# Patient Record
Sex: Female | Born: 1970
Health system: Southern US, Community
[De-identification: ages and names within clinical notes are randomized; demographics above are authoritative.]

## PROBLEM LIST (undated history)

## (undated) DIAGNOSIS — J069 Acute upper respiratory infection, unspecified: Secondary | ICD-10-CM

## (undated) DIAGNOSIS — Z8669 Personal history of other diseases of the nervous system and sense organs: Secondary | ICD-10-CM

## (undated) DIAGNOSIS — F419 Anxiety disorder, unspecified: Secondary | ICD-10-CM

## (undated) DIAGNOSIS — K219 Gastro-esophageal reflux disease without esophagitis: Principal | ICD-10-CM

## (undated) DIAGNOSIS — S61209A Unspecified open wound of unspecified finger without damage to nail, initial encounter: Secondary | ICD-10-CM

## (undated) DIAGNOSIS — J019 Acute sinusitis, unspecified: Secondary | ICD-10-CM

## (undated) DIAGNOSIS — T7840XA Allergy, unspecified, initial encounter: Secondary | ICD-10-CM

## (undated) DIAGNOSIS — L03119 Cellulitis of unspecified part of limb: Secondary | ICD-10-CM

## (undated) DIAGNOSIS — R0789 Other chest pain: Secondary | ICD-10-CM

## (undated) DIAGNOSIS — R030 Elevated blood-pressure reading, without diagnosis of hypertension: Secondary | ICD-10-CM

## (undated) DIAGNOSIS — R040 Epistaxis: Secondary | ICD-10-CM

## (undated) DIAGNOSIS — R739 Hyperglycemia, unspecified: Secondary | ICD-10-CM

## (undated) DIAGNOSIS — F329 Major depressive disorder, single episode, unspecified: Secondary | ICD-10-CM

## (undated) DIAGNOSIS — M533 Sacrococcygeal disorders, not elsewhere classified: Secondary | ICD-10-CM

## (undated) DIAGNOSIS — G4700 Insomnia, unspecified: Secondary | ICD-10-CM

## (undated) DIAGNOSIS — Z720 Tobacco use: Secondary | ICD-10-CM

## (undated) DIAGNOSIS — Z0000 Encounter for general adult medical examination without abnormal findings: Secondary | ICD-10-CM

## (undated) DIAGNOSIS — E663 Overweight: Secondary | ICD-10-CM

## (undated) DIAGNOSIS — Z862 Personal history of diseases of the blood and blood-forming organs and certain disorders involving the immune mechanism: Secondary | ICD-10-CM

## (undated) DIAGNOSIS — D649 Anemia, unspecified: Secondary | ICD-10-CM

## (undated) DIAGNOSIS — M549 Dorsalgia, unspecified: Secondary | ICD-10-CM

## (undated) DIAGNOSIS — F17201 Nicotine dependence, unspecified, in remission: Secondary | ICD-10-CM

## (undated) DIAGNOSIS — Z9189 Other specified personal risk factors, not elsewhere classified: Secondary | ICD-10-CM

## (undated) HISTORY — DX: Unspecified open wound of unspecified finger without damage to nail, initial encounter: S61.209A

## (undated) HISTORY — DX: Major depressive disorder, single episode, unspecified: F32.9

## (undated) HISTORY — DX: Anxiety disorder, unspecified: F41.9

## (undated) HISTORY — DX: Epistaxis: R04.0

## (undated) HISTORY — DX: Cellulitis of unspecified part of limb: L03.119

## (undated) HISTORY — DX: Personal history of diseases of the blood and blood-forming organs and certain disorders involving the immune mechanism: Z86.2

## (undated) HISTORY — DX: Allergy, unspecified, initial encounter: T78.40XA

## (undated) HISTORY — DX: Hyperglycemia, unspecified: R73.9

## (undated) HISTORY — PX: COLONOSCOPY: SHX174

## (undated) HISTORY — DX: Overweight: E66.3

## (undated) HISTORY — DX: Insomnia, unspecified: G47.00

## (undated) HISTORY — DX: Other chest pain: R07.89

## (undated) HISTORY — DX: Acute sinusitis, unspecified: J01.90

## (undated) HISTORY — DX: Anemia, unspecified: D64.9

## (undated) HISTORY — PX: WISDOM TOOTH EXTRACTION: SHX21

## (undated) HISTORY — DX: Other specified personal risk factors, not elsewhere classified: Z91.89

## (undated) HISTORY — DX: Elevated blood-pressure reading, without diagnosis of hypertension: R03.0

## (undated) HISTORY — DX: Encounter for general adult medical examination without abnormal findings: Z00.00

## (undated) HISTORY — DX: Gastro-esophageal reflux disease without esophagitis: K21.9

## (undated) HISTORY — PX: OTHER SURGICAL HISTORY: SHX169

## (undated) HISTORY — DX: Nicotine dependence, unspecified, in remission: F17.201

## (undated) HISTORY — DX: Dorsalgia, unspecified: M54.9

## (undated) HISTORY — DX: Sacrococcygeal disorders, not elsewhere classified: M53.3

## (undated) HISTORY — DX: Personal history of other diseases of the nervous system and sense organs: Z86.69

## (undated) HISTORY — DX: Acute upper respiratory infection, unspecified: J06.9

## (undated) HISTORY — DX: Tobacco use: Z72.0

---

## 2006-12-26 ENCOUNTER — Emergency Department (HOSPITAL_COMMUNITY): Admission: EM | Admit: 2006-12-26 | Discharge: 2006-12-26 | Payer: Self-pay | Admitting: Family Medicine

## 2007-02-16 ENCOUNTER — Emergency Department (HOSPITAL_COMMUNITY): Admission: EM | Admit: 2007-02-16 | Discharge: 2007-02-16 | Payer: Self-pay | Admitting: Family Medicine

## 2008-12-06 ENCOUNTER — Encounter: Payer: Self-pay | Admitting: Family Medicine

## 2009-05-12 ENCOUNTER — Emergency Department (HOSPITAL_COMMUNITY): Admission: EM | Admit: 2009-05-12 | Discharge: 2009-05-12 | Payer: Self-pay | Admitting: Family Medicine

## 2010-12-31 ENCOUNTER — Encounter: Payer: Self-pay | Admitting: Family Medicine

## 2011-01-20 ENCOUNTER — Telehealth (INDEPENDENT_AMBULATORY_CARE_PROVIDER_SITE_OTHER): Payer: Self-pay | Admitting: *Deleted

## 2011-01-20 ENCOUNTER — Encounter: Payer: Self-pay | Admitting: Family Medicine

## 2011-01-20 ENCOUNTER — Ambulatory Visit
Admission: RE | Admit: 2011-01-20 | Discharge: 2011-01-20 | Payer: Self-pay | Source: Home / Self Care | Attending: Family Medicine | Admitting: Family Medicine

## 2011-01-20 DIAGNOSIS — F418 Other specified anxiety disorders: Secondary | ICD-10-CM | POA: Insufficient documentation

## 2011-01-20 DIAGNOSIS — Z8669 Personal history of other diseases of the nervous system and sense organs: Secondary | ICD-10-CM

## 2011-01-20 DIAGNOSIS — K219 Gastro-esophageal reflux disease without esophagitis: Secondary | ICD-10-CM

## 2011-01-20 DIAGNOSIS — Z9189 Other specified personal risk factors, not elsewhere classified: Secondary | ICD-10-CM | POA: Insufficient documentation

## 2011-01-20 DIAGNOSIS — Z862 Personal history of diseases of the blood and blood-forming organs and certain disorders involving the immune mechanism: Secondary | ICD-10-CM

## 2011-01-20 DIAGNOSIS — K5289 Other specified noninfective gastroenteritis and colitis: Secondary | ICD-10-CM | POA: Insufficient documentation

## 2011-01-20 DIAGNOSIS — F329 Major depressive disorder, single episode, unspecified: Secondary | ICD-10-CM

## 2011-01-20 HISTORY — DX: Personal history of other diseases of the nervous system and sense organs: Z86.69

## 2011-01-20 HISTORY — DX: Personal history of diseases of the blood and blood-forming organs and certain disorders involving the immune mechanism: Z86.2

## 2011-01-20 HISTORY — DX: Other specified personal risk factors, not elsewhere classified: Z91.89

## 2011-01-20 HISTORY — DX: Major depressive disorder, single episode, unspecified: F32.9

## 2011-01-20 HISTORY — DX: Gastro-esophageal reflux disease without esophagitis: K21.9

## 2011-01-22 ENCOUNTER — Telehealth: Payer: Self-pay | Admitting: Family Medicine

## 2011-01-27 ENCOUNTER — Ambulatory Visit
Admission: RE | Admit: 2011-01-27 | Discharge: 2011-01-27 | Payer: Self-pay | Source: Home / Self Care | Attending: Family Medicine | Admitting: Family Medicine

## 2011-01-27 DIAGNOSIS — S61209A Unspecified open wound of unspecified finger without damage to nail, initial encounter: Secondary | ICD-10-CM

## 2011-01-27 HISTORY — DX: Unspecified open wound of unspecified finger without damage to nail, initial encounter: S61.209A

## 2011-01-29 NOTE — Progress Notes (Signed)
Summary: Medical Records Received  Phone Note Other Incoming   Summary of Call: Medical Records received from [place]. [facility] Dr. Edd Arbour 1.26.2012. Initial call taken by: Georga Bora,  January 22, 2011 2:00 PM

## 2011-01-29 NOTE — Letter (Signed)
Summary: Out of Work  [facility]  [address]   [place], [place] [address]   Phone: [phone]  Fax: [phone]    01/20/2011  TO: Leodis Sias IT MAY CONCERN  RE: Samantha Morales [phone] [id number]       The above named individual is currently under my care and will be out of work    FROM: 01/19/2011   THROUGH:01/20/2011, return to work on 1/25    REASON: Gastroenteritis    MAY RETURN ON: 01/21/11     If you have any further questions or need additional information, please call.     Sincerely,   Danise Edge MD typed by: Danise Edge MD

## 2011-01-29 NOTE — Progress Notes (Signed)
Summary: Medication and allergies      New Allergies: ! SULFA ! PENICILLIN ! ERYTHROMYCIN New/Updated Medications: CELEXA 20 MG TABS (CITALOPRAM HYDROBROMIDE) once daily OMEPRAZOLE 20 MG CPDR (OMEPRAZOLE) once daily VITAMIN D (ERGOCALCIFEROL) 50000 UNIT CAPS (ERGOCALCIFEROL) once weekly INTEGRA PLUS  CAPS (FEFUM-FEPOLY-FA-B CMP-C-BIOT) once daily IBUPROFEN 200 MG CAPS (IBUPROFEN) 4 q 4 hours New Allergies: ! SULFA ! PENICILLIN ! ERYTHROMYCIN    Current Medications (verified): 1)  Celexa 20 Mg Tabs (Citalopram Hydrobromide) .... Once Daily 2)  Omeprazole 20 Mg Cpdr (Omeprazole) .... Once Daily 3)  Vitamin D (Ergocalciferol) 50000 Unit Caps (Ergocalciferol) .... Once Weekly 4)  Integra Plus  Caps (Fefum-Fepoly-Fa-B Cmp-C-Biot) .... Once Daily 5)  Ibuprofen 200 Mg Caps (Ibuprofen) .... 4 Q 4 Hours  Allergies (verified): 1)  ! Sulfa 2)  ! Penicillin 3)  ! Erythromycin

## 2011-01-29 NOTE — Assessment & Plan Note (Addendum)
Summary: New pt est care/dt moved appt per pt/dt   Vital Signs:  Patient profile:   40 year old female Height:      67.5 inches (171.45 cm) Weight:      152.50 pounds (69.32 kg) BMI:     23.62 O2 Sat:      100 % on Room air Temp:     98.3 degrees F (36.83 degrees C) oral Pulse rate:   80 / minute BP sitting:   116 / 75  (right arm) Cuff size:   regular  Vitals Entered By: Josph Macho RMA (January 20, 2011 12:58 PM)  O2 Flow:  Room air  History of Present Illness: Patient is a 40 yo caucasian femalein today for urgent new pateint appt. issue has been struggling with gastroenteritis for 2-3 days now. Symptoms started at 6 AM on Monday with diarrhea. She reports watery stool every 10 minutes or less over a full 24. She ultimately took 2 Imodium and then repeated twice yesterday her diarrhea finally stopped late last night. This morning she's had just one loose stool. She had 3 episodes of vomiting yesterday and now just one today. She noticed she was able to have a piece of toast this morning and so has not had any subsequent diarrhea or nausea as a result she denies ever seeing any bloody stool or vomitus. She denies any fevers chills or urinary symptoms. She continues to urinate has not had decreased urinary frequency. She's had very little in the way of fluids but has been trying to drink a lot of water and Gatorade. She's had some abdominal cramping but no severe abdominal pains. No chest pain, palpitations, shortness of breath, fevers chills, headache prior to this acute episode she reports she had been in good health he will she doesn't not taking ibuprofen or some troubles in her legs. 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 previously being diagnosed with restless leg syndrome to this area for leg symptoms at night and was tried on a course of Requip but this  caused vomiting so she has not restarted  Preventive Screening-Counseling & Management  Alcohol-Tobacco     Smoking Status: current  Caffeine-Diet-Exercise     Does Patient Exercise: no      Drug Use:  no.    Current Problems (verified): 1)  Restless Leg Syndrome, Hx of  (ICD-V12.49) 2)  Gastroenteritis  (ICD-558.9) 3)  Anemia, Hx of  (ICD-V12.3) 4)  Chickenpox, Hx of  (ICD-V15.9) 5)  Gerd  (ICD-530.81) 6)  Depression  (ICD-311)  Allergies: 1)  ! Sulfa 2)  ! Penicillin 3)  ! 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, 2, in college, soccer scholarship  Current Smoker 1/2 ppd Alcohol use-yes, occasional Drug use-no Regular exercise-no Wears seat belt No dietary restrictions  Risk Factors: Exercise: no (01/20/2011)  Risk Factors: Smoking Status: current (01/20/2011)  Past Surgical History: Lasik eye surgery x b/l  Family History: Father: 55, HTN Mother: 4, A&W Siblings:  Brother:42, HTN Brother:45, anemia Sister: 60, Lupus MGM: deceased in mid 60s, breast cancer, Alzheimers MGF: deceased@80 , brain aneurysm, rheumatoid arthritis PGM: deceased in 63s, old age, kidney stones PGF: deceased in late 33s, MI Children:  None  Social History: Occupation: Psychologist, clinical Married, 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, 2, in college, soccer scholarship  Current Smoker 1/2 ppd Alcohol use-yes, occasional Drug use-no Regular exercise-no Wears seat belt No dietary restrictionsOccupation:  employed Smoking Status:  current Drug  Use:  no Does Patient Exercise:  no  Review of Systems  The patient denies anorexia, fever, weight loss, weight gain, vision loss, decreased hearing, hoarseness, chest pain, syncope, dyspnea on exertion, peripheral edema, prolonged cough, headaches, hemoptysis, abdominal pain, melena, hematochezia, severe indigestion/heartburn, hematuria, incontinence, genital sores, muscle weakness, suspicious skin lesions, transient blindness, difficulty walking, depression, unusual weight change, abnormal bleeding, and enlarged lymph nodes.    Physical Exam  General:  Well-developed,well-nourished,in no acute distress; alert,appropriate and cooperative throughout examination Head:  Normocephalic and atraumatic without obvious abnormalities. No apparent alopecia or balding. Eyes:  No corneal or conjunctival inflammation noted. EOMI. Perrla. Funduscopic exam benign, without hemorrhages, exudates or papilledema. Vision grossly normal. Ears:  External ear exam shows no significant lesions or deformities.  Otoscopic examination reveals clear canals, tympanic membranes are intact bilaterally without bulging, retraction, inflammation or discharge. Hearing is grossly normal bilaterally. Nose:  External nasal examination shows no deformity or inflammation. Nasal mucosa are pink and moist without lesions or exudates. Mouth:  Oral mucosa and oropharynx without lesions or exudates.  Teeth in good repair. Slightly dry MM Neck:  No deformities, masses, or tenderness noted. Lungs:  Normal respiratory effort, chest expands symmetrically. Lungs are clear to auscultation, no crackles or wheezes. Heart:  Normal rate and regular rhythm. S1 and S2 normal without gallop, murmur, click, rub or other extra sounds. Abdomen:  Bowel sounds positive,abdomen soft and non-tender without masses, organomegaly or hernias noted. Msk:  No deformity or scoliosis noted of thoracic or lumbar spine.   Pulses:  R and L carotid,dorsalis pedis and  posterior tibial pulses are full and equal bilaterally Extremities:  No clubbing, cyanosis, edema, or deformity noted with normal full range of motion of all joints.   Neurologic:  No cranial nerve deficits noted. Station and gait are normal. Plantar reflexes are down-going bilaterally. DTRs are symmetrical throughout. Sensory, motor and coordinative functions appear intact. Skin:  Intact without suspicious lesions or rashes Cervical Nodes:  No lymphadenopathy noted Psych:  Cognition and judgment appear intact. Alert and cooperative with normal attention span and concentration. No apparent delusions, illusions, hallucinations   Impression & Recommendations:  Problem # 1:  GERD (ICD-530.81)  Her updated medication list for this problem includes:    Omeprazole 20 Mg Cpdr (Omeprazole) ..... Once daily Avoid offending foods and raise the head of the bed.  Problem # 2:  DEPRESSION (ICD-311)  Her updated medication list for this problem includes:    Celexa 20 Mg Tabs (Citalopram hydrobromide) ..... Once daily Stable on this dose at present. No changes.  Problem # 3:  Preventive Health Care (ICD-V70.0) Obtained a release of records for last year of GYN notes, patient will return for fasting labs and visit in  3 months time once old records are ready for review.  Problem # 4:  GASTROENTERITIS (ICD-558.9) Patient is encouraged  to increase clear fluids, use some electrolyte filled fluids, restart diet slowly with Brat diet and then progress as tolerated. Given some Promethazine only to use  if vomiting returns  Problem # 5:  ANEMIA, HX OF (ICD-V12.3) Encouraged increased leafy greens and check a CBC prior to next visit  Problem # 6:  RESTLESS LEG SYNDROME, HX OF (ICD-V12.49) Try increased hydration and an Hyland's Night Time cramp meds as needed, if symptoms persist may need to consider a different prescription medication  Complete Medication List: 1)  Celexa 20 Mg Tabs (Citalopram  hydrobromide) .... Once daily 2)  Omeprazole 20 Mg Cpdr (Omeprazole) .... Once daily 3)  Vitamin D (ergocalciferol) 50000 Unit Caps (Ergocalciferol) .... Once weekly 4)  Integra Plus Caps (Fefum-fepoly-fa-b cmp-c-biot) .... Once daily 5)  Ibuprofen 200 Mg Caps (Ibuprofen) .... 4 q 4 hours 6)  Promethazine Hcl 25 Mg Tabs (Promethazine hcl) .... 1/2 to 1 tab by mouth q 8 hours as needed n/v  Patient Instructions: 1)  Please schedule a follow-up appointment in 3 months .  2)  Please schedule a follow-up appointment as needed if symptoms do not resolve 3)  Drink clear liquids only for the next 24 hours, then slowly add other liquids and food as you tolerate them .  4)  The main problem with gastroentereritis is dehydration. Drink plenty of fluids and take solids as you feel better. If you are unable to keep anything down and/or you show signs of dehydration( dry cracked lips, lack of tears, not urinating, very sleepy) , call our office. Use Gatorade and Ginger ale. Eat a bland Diet (BRAT=Bananas, Rice Applesauce , Toast) Progress slowly to regular diet 5)  Release of Records: Dr Yvette Rack, OB/GYN, just need past year. 6)  May use Omeprazole two times a day for 3 days once no vomitting is occuring and can consider Mylanta if abdominal burning persists Prescriptions: PROMETHAZINE HCL 25 MG TABS (PROMETHAZINE HCL) 1/2 to 1 tab by mouth q 8 hours as needed n/v  #20 x 1   Entered and Authorized by:   Danise Edge MD   Signed by:   Danise Edge MD on 01/20/2011   Method used:   Electronically to        [facility] [phone] (retail)       [address] [place], [place]  [address]       Ph: [phone] or [phone]       Fax: [phone]   RxID:   [id number]    Orders Added: 1)  Est. Patient Level IV [37169]  Appended Document: New pt est care/dt moved appt per pt/dt Medical record release faxed 01/20/11 to [facility] for [employer]

## 2011-02-04 NOTE — Assessment & Plan Note (Signed)
Summary: tetnus shot cut finger/vfw   Vital Signs:  Patient profile:   40 year old female Height:      67.5 inches (171.45 cm) Weight:      156.50 pounds (71.14 kg) O2 Sat:      100 % on Room air Temp:     98.2 degrees F (36.78 degrees C) oral Pulse rate:   70 / minute BP sitting:   143 / 89  (right arm) Cuff size:   regular  Vitals Entered By: Josph Macho RMA (January 27, 2011 3:44 PM)  O2 Flow:  Room air CC: Cut finger on Friday/ Tetanus shot/ CF Is Patient Diabetic? No   History of Present Illness: is a 40 year old Caucasian female is in today for evaluation of a laceration on her left pointer finger. She is here at her aunt was working with scissors on the clients hair when she cut her finger roughly 4 days ago. She cleaned it well has been keeping it clean to daily and cover with a antibiotic ointment and a clean Band-Aid. She's had no swelling, redness, discharge, pain other than the initial cut. She's not had any symptoms of systemic illness such as anorexia, fevers, chills, malaise, myalgias. But realized she needed a tetanus shot and is here today for evaluation. She reports last tetanus shot was roughly 12 years ago and she had felt very tired and  ill for 2-3 days after the shot so she was somewhat hesitant to come in.   Her gastroenteritis symptoms resolved after her last visit. She had one episode of vomiting after leaving here to [facility] and wasn't able to go to sleep and wake up feeling significantly better today she denies any nausea, anorexia, diarrhea, abdominal pain, chest pain, palpitations, shortness of breath  Current Medications (verified): 1)  Celexa 20 Mg Tabs (Citalopram Hydrobromide) .... Once Daily 2)  Omeprazole 20 Mg Cpdr (Omeprazole) .... Once Daily 3)  Vitamin D (Ergocalciferol) 50000 Unit Caps (Ergocalciferol) .... Once Weekly 4)  Integra Plus  Caps (Fefum-Fepoly-Fa-B Cmp-C-Biot) .... Once Daily 5)  Ibuprofen 200 Mg Caps (Ibuprofen) .... 4 Q 4  Hours  Allergies (verified): 1)  ! Sulfa 2)  ! Penicillin 3)  ! Erythromycin  Past History:  Past medical history reviewed for relevance to current acute and chronic problems. Social history (including risk factors) reviewed for relevance to current acute and chronic problems.  Social History: Reviewed history from 01/20/2011 and no changes required. 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 EOMI. Perrla.  Ears:  External ear exam shows no significant lesions or deformities.  Otoscopic examination reveals clear canals, tympanic membranes are intact bilaterally without bulging, retraction, inflammation or discharge.  Nose:  External nasal examination shows no deformity or inflammation. Nasal mucosa are pink and moist without lesions or exudates. Mouth:  Oral mucosa and oropharynx without lesions or exudates.  Teeth in good repair. Neck:  No deformities, masses, or tenderness noted. Lungs:  Normal respiratory effort, chest expands symmetrically. Lungs are clear to auscultation, no crackles or wheezes. Heart:  Normal rate and regular rhythm. S1 and S2 normal  without gallop, murmur, click, rub or other extra sounds. Abdomen:  Bowel sounds positive,abdomen soft and non-tender without masses, organomegaly or hernias noted. Pulses:  R and L carotid, dorsalis pedis and posterior tibial pulses are full and equal bilaterally Extremities:  No clubbing, cyanosis, edema, or deformity noted with normal full range of motion of  all joints.   Cervical Nodes:  No lymphadenopathy noted Psych:  Cognition and judgment appear intact. Alert and cooperative with normal attention span and concentration. No apparent delusions, illusions, hallucinations   Impression & Recommendations:  Problem # 1:  LACERATION OF FINGER (ICD-883.0) Left second finger, area soaked in 1/2 warm water and 1/2 hydrogen peroxide then the edges of the loose skin are trimmed, patient tolerates process well. Area is covered with a sterile bandage and patient is given a Tdap shot today and advised to cleanse the area daily with mild soap and water and then keep clean and dry. Then keep it covered with antibiotic ointment and bandaid when out and open to air when home. Call with any changes or concerns such as increased redness, swelling, discharge, etc  Problem # 2:  GASTROENTERITIS (ICD-558.9) Symptoms completely resolved since last visit. After her visit here she had one more episode of vomitting took 1/2 of a Prmethazine, went to sleep and felt better when she woke up.   Complete Medication List: 1)  Celexa 20 Mg Tabs (Citalopram hydrobromide) .Marland Kitchen.. 1 tab by mouth daily 2)  Omeprazole 20 Mg Cpdr (Omeprazole) .Marland Kitchen.. 1 tab by mouth daily 3)  Vitamin D (ergocalciferol) 50000 Unit Caps (Ergocalciferol) .... Once weekly 4)  Integra Plus Caps (Fefum-fepoly-fa-b cmp-c-biot) .... Once daily 5)  Ibuprofen 200 Mg Caps (Ibuprofen) .... 4 q 4 hours  Other Orders: Tdap => 93yrs IM (47829) Admin 1st Vaccine (56213)  Patient Instructions: 1)  Please schedule a follow-up appointment as needed .  2)  Keep the finger clean and dry when home, while out keep covered with antibiotic ointment and bandaids. Cleanse once to twice daily in Hydrogen Peroxide and water, 1/2 and 1/2, for 10 minutes. Prescriptions: OMEPRAZOLE 20 MG CPDR (OMEPRAZOLE) 1 tab by mouth daily  #30 x 5   Entered and Authorized by:   Danise Edge MD   Signed by:   Danise Edge MD on 01/27/2011    Method used:   [facility]        [facility]  [address] [phone][facility]ail[facility]       [address]       [place], [place]  [address]       Ph: [phone] or [phone]       Fax: [phone]   RxID:   [id number] CELEXA 20 MG TABS (CITALOPRAM HYDROBROMIDE) 1 tab by mouth daily  #30 x 5   Entered and Authorized by:   Danise Edge MD   Signed by:   Danise Edge MD on 01/27/2011   Method used:   [facility]        [facility]  [address] [phone][facility]ail[facility]       [address] [place], [place]  [address]       Ph: [phone] or [phone]       Fax: [phone]   RxID:   [id number]    Orders Added: 1)  Tdap => 46yrs IM [90715] 2)  Admin 1st Vaccine [90471] 3)  Est. Patient  Level III [16109]   Immunizations Administered:  Tetanus Vaccine:    Vaccine Type: Tdap    Site: left deltoid    Mfr: GlaxoSmithKline    Dose: 0.5 ml    Route: IM    Given by: Josph Macho RMA    Exp. Date: 10/16/2012    Lot #: [id number]    VIS given: 11/14/08 version given January 27, 2011.   Immunizations Administered:  Tetanus Vaccine:    Vaccine Type: Tdap    Site: left deltoid    Mfr: GlaxoSmithKline    Dose: 0.5 ml    Route: IM    Given by: Josph Macho RMA    Exp. Date: 10/16/2012    Lot #: [id number]    VIS given: 11/14/08 version given January 27, 2011.

## 2011-02-05 ENCOUNTER — Encounter: Payer: Self-pay | Admitting: *Deleted

## 2011-02-24 NOTE — Letter (Signed)
Summary: [facility]  [facility]   Imported By: Lester [place] 02/16/2011 08:31:20  _____________________________________________________________________  External Attachment:    Type:   Image     Comment:   External Document

## 2011-04-07 LAB — POCT URINALYSIS DIP (DEVICE)
Glucose, UA: NEGATIVE mg/dL
Ketones, ur: 15 mg/dL — AB
Nitrite: NEGATIVE
Protein, ur: 100 mg/dL — AB
Specific Gravity, Urine: >=1.03 (ref 1.005–1.030)
Urobilinogen, UA: 0.2 mg/dL (ref 0.0–1.0)
pH: 6 (ref 5.0–8.0)

## 2011-05-12 ENCOUNTER — Ambulatory Visit (INDEPENDENT_AMBULATORY_CARE_PROVIDER_SITE_OTHER): Payer: No Typology Code available for payment source | Admitting: Family Medicine

## 2011-05-12 ENCOUNTER — Encounter: Payer: Self-pay | Admitting: Family Medicine

## 2011-05-12 DIAGNOSIS — Z720 Tobacco use: Secondary | ICD-10-CM

## 2011-05-12 DIAGNOSIS — F17201 Nicotine dependence, unspecified, in remission: Secondary | ICD-10-CM | POA: Insufficient documentation

## 2011-05-12 DIAGNOSIS — K219 Gastro-esophageal reflux disease without esophagitis: Secondary | ICD-10-CM

## 2011-05-12 DIAGNOSIS — L03119 Cellulitis of unspecified part of limb: Secondary | ICD-10-CM | POA: Insufficient documentation

## 2011-05-12 DIAGNOSIS — L02419 Cutaneous abscess of limb, unspecified: Secondary | ICD-10-CM

## 2011-05-12 DIAGNOSIS — F17200 Nicotine dependence, unspecified, uncomplicated: Secondary | ICD-10-CM

## 2011-05-12 HISTORY — DX: Cellulitis of unspecified part of limb: L03.119

## 2011-05-12 HISTORY — DX: Tobacco use: Z72.0

## 2011-05-12 HISTORY — DX: Nicotine dependence, unspecified, in remission: F17.201

## 2011-05-12 MED ORDER — CEPHALEXIN 500 MG PO CAPS: 500.0000 mg | ORAL_CAPSULE | Freq: Four times a day (QID) | ORAL | Status: AC

## 2011-05-12 NOTE — Assessment & Plan Note (Signed)
Started on Keflex 500mg , which she reports she has taken before and had a good response to. Encouraged a daily probiotic, such as Librarian, academic and/or a daily. Cleanse area with [facility] Astringent daily, report worsening symptoms

## 2011-05-12 NOTE — Progress Notes (Signed)
Samantha Morales [id number] Mar 29, 1971 05/12/2011      Progress Note-Follow Up  Subjective  Chief Complaint  Chief Complaint  Patient presents with  . Wound Infection    tattoo infected on left ankle    HPI  Patient is a 40 yo female in today with concerns regarding an infected tattoo. She reports on 05/04/2011 she got a tattoo consisting of 3 stars above her left medial malleolus then the next day she actually scratch want to start with her fingernail and since then has become inflamed tender and swollen. Over the last couple days it's become increasingly painful. She denies fevers, chills, myalgias, malaise, anorexia or any systemic concerns. She does have multiple antibiotic allergies but does not the past she's taking Keflex and tolerated that well. She's tried placing antibiotic with minimal lesion but it is not resolving and instead is slowly worsening.  Past Medical History  Diagnosis Date  . RESTLESS LEG SYNDROME, HX OF 01/20/2011  . LACERATION OF FINGER 01/27/2011  . GERD 01/20/2011  . DEPRESSION 01/20/2011  . CHICKENPOX, HX OF 01/20/2011  . ANEMIA, HX OF 01/20/2011  . Tobacco abuse 05/12/2011  . Cellulitis of ankle 05/12/2011    Past Surgical History  Procedure Date  . Lasik eye surgery x b/l     Family History  Problem Relation Age of Onset  . Hypertension Mother   . Lupus Sister   . Hypertension Brother   . Cancer Maternal Grandmother     breast  . Alzheimer's disease Maternal Grandmother   . Aneurysm Maternal Grandfather     brain  . Arthritis Maternal Grandfather     rheumatoid  . Nephrolithiasis Paternal Grandmother   . Heart attack Paternal Grandfather   . Anemia Brother     History   Social History  . Marital Status: Married    Spouse Name: N/A    Number of Children: N/A  . Years of Education: N/A   Occupational History  . Not on file.   Social History Main Topics  . Smoking status: Current Everyday Smoker -- 0.5 packs/day  . Smokeless tobacco:  Never Used  . Alcohol Use: 0.0 oz/week    0 drink(s) per week     6-7 a week  . Drug Use: No  . Sexually Active: Yes -- Female partner(s)   Other Topics Concern  . Not on file   Social History Narrative  . No narrative on file    Current Outpatient Prescriptions on File Prior to Visit  Medication Sig Dispense Refill  . citalopram (CELEXA) 20 MG tablet Take 20 mg by mouth daily.        Marland Kitchen FeFum-FePoly-FA-B Cmp-C-Biot (INTEGRA PLUS) CAPS Take by mouth daily.        Marland Kitchen ibuprofen (ADVIL,MOTRIN) 200 MG tablet Take 200 mg by mouth every 4 (four) hours as needed.        Marland Kitchen omeprazole (PRILOSEC) 20 MG capsule Take 20 mg by mouth once a week.        Marland Kitchen DISCONTD: ergocalciferol (VITAMIN D2) 50000 UNITS capsule Take 50,000 Units by mouth once a week.          Allergies  Allergen Reactions  . Erythromycin     REACTION: rash  . Penicillins     REACTION: swelling, rash  . Sulfonamide Derivatives     REACTION: GI upset    Review of Systems  Review of Systems  Constitutional: Negative for fever, chills and malaise/fatigue.  HENT: Negative for congestion.  Eyes: Negative for discharge.  Respiratory: Negative for shortness of breath.   Cardiovascular: Negative for chest pain, palpitations and leg swelling.  Gastrointestinal: Positive for heartburn. Negative for nausea, abdominal pain and diarrhea.       [No anorexia Genitourinary: Negative for dysuria.  Musculoskeletal: Negative for myalgias, joint pain and falls.  Skin: Negative for rash.       [Patient notes she got a tattoo above her left ankle on 05/04/2011. On 5/8 she accidentally scratched it with her fingernail and the portion she scratched has become inflamed and painful over the past week. No f/c/systemic symptoms. She has tried applying Neosporin and that has not been helpful, the pain has intensified the past 2 days Neurological: Negative for loss of consciousness and headaches.  Endo/Heme/Allergies: Negative for polydipsia.    Psychiatric/Behavioral: Negative for depression and suicidal ideas. The patient is not nervous/anxious and does not have insomnia.     Objective  BP 123/76  Pulse 69  Temp(Src) 97.9 F (36.6 C) (Oral)  Ht 5' 7.5" (1.715 m)  Wt 160 lb 6.4 oz (72.757 kg)  BMI 24.75 kg/m2  SpO2 99%  LMP 05/04/2011  Physical Exam  Physical Exam  Constitutional: She is oriented to person, place, and time and well-developed, well-nourished, and in no distress. No distress.  HENT:  Head: Normocephalic and atraumatic.  Eyes: Conjunctivae are normal.  Neck: Neck supple. No thyromegaly present.  Cardiovascular: Normal rate, regular rhythm and normal heart sounds.   No murmur heard. Pulmonary/Chest: Effort normal and breath sounds normal. She has no wheezes.  Abdominal: She exhibits no distension and no mass.  Musculoskeletal: She exhibits no edema.  Lymphadenopathy:    She has no cervical adenopathy.  Neurological: She is alert and oriented to person, place, and time.  Skin: Skin is warm and dry. Rash noted. She is not diaphoretic. There is erythema.       Tattoo above left medial malleolus consists of 3 stars, 1 purple, 1 green and 1 red all lined with black ink. The red star is inflamed, swollen with surrounding erythema but without pustules or discharge.  Psychiatric: Memory, affect and judgment normal.    No results found for this basename: TSH   No results found for this basename: WBC, HGB, HCT, MCV, PLT   No results found for this basename: CREATININE, BUN, NA, K, CL, CO2     Assessment & Plan  GERD Doing well without c/o. May cont to use Omeprazole ptn  Cellulitis of ankle Started on Keflex 500mg , which she reports she has taken before and had a good response to. Encouraged a daily probiotic, such as Librarian, academic and/or a daily. Cleanse area with [facility] Astringent daily, report worsening symptoms

## 2011-05-12 NOTE — Assessment & Plan Note (Signed)
Doing well without c/o. May cont to use Omeprazole ptn

## 2011-05-12 NOTE — Patient Instructions (Signed)
Cellulitis Cellulitis is an infection of the skin and the tissue beneath it. The area is typically red and tender. It is caused by germs (bacteria) (usually staph or strep) that enter the body through cuts or sores. Cellulitis most commonly occurs in the arms or lower legs.  HOME CARE INSTRUCTIONS  If you are given a prescription for medications which kill germs (antibiotics), take as directed until finished.   If the infection is on the arm or leg, keep the limb elevated as able.   Use a warm cloth several times per day to relieve pain and encourage healing.   See your caregiver for recheck of the infected site  if problems arise.   Only take over-the-counter or prescription medicines for pain, discomfort, or fever as directed by your caregiver.  SEEK MEDICAL CARE IF:  An oral temperature above 104 develops, not controlled by medication.   The area of redness (inflammation) is spreading, there are red streaks coming from the infected site, or if a part of the infection begins to turn dark in color.   The joint or bone underneath the infected skin becomes painful after the skin has healed.   The infection returns in the same or another area after it seems to have gone away.   A boil or bump swells up. This may be an abscess.   New, unexplained problems such as pain or fever develop.  SEEK IMMEDIATE MEDICAL CARE IF:  You or your child feels drowsy or lethargic.   There is vomiting, diarrhea, or lasting discomfort or feeling ill (malaise) with muscle aches and pains.  MAKE SURE YOU:   Understand these instructions.   Will watch your condition.   Will get help right away if you are not doing well or get worse.  Document Released: 09/23/2005 Document Re-Released: 10/11/2009 [facility] Patient Information 2011 [place], [place].

## 2011-11-21 ENCOUNTER — Other Ambulatory Visit: Payer: Self-pay | Admitting: Family Medicine

## 2011-12-08 ENCOUNTER — Ambulatory Visit (INDEPENDENT_AMBULATORY_CARE_PROVIDER_SITE_OTHER): Payer: No Typology Code available for payment source

## 2011-12-08 DIAGNOSIS — Z23 Encounter for immunization: Secondary | ICD-10-CM

## 2012-04-15 ENCOUNTER — Ambulatory Visit (INDEPENDENT_AMBULATORY_CARE_PROVIDER_SITE_OTHER): Payer: No Typology Code available for payment source | Admitting: Family Medicine

## 2012-04-15 ENCOUNTER — Encounter: Payer: Self-pay | Admitting: Family Medicine

## 2012-04-15 DIAGNOSIS — Z720 Tobacco use: Secondary | ICD-10-CM

## 2012-04-15 DIAGNOSIS — E079 Disorder of thyroid, unspecified: Secondary | ICD-10-CM

## 2012-04-15 DIAGNOSIS — R079 Chest pain, unspecified: Secondary | ICD-10-CM

## 2012-04-15 DIAGNOSIS — F17200 Nicotine dependence, unspecified, uncomplicated: Secondary | ICD-10-CM

## 2012-04-15 DIAGNOSIS — R0789 Other chest pain: Secondary | ICD-10-CM

## 2012-04-15 DIAGNOSIS — K219 Gastro-esophageal reflux disease without esophagitis: Secondary | ICD-10-CM

## 2012-04-15 DIAGNOSIS — IMO0001 Reserved for inherently not codable concepts without codable children: Secondary | ICD-10-CM

## 2012-04-15 DIAGNOSIS — G4700 Insomnia, unspecified: Secondary | ICD-10-CM

## 2012-04-15 DIAGNOSIS — R030 Elevated blood-pressure reading, without diagnosis of hypertension: Secondary | ICD-10-CM

## 2012-04-15 DIAGNOSIS — M546 Pain in thoracic spine: Secondary | ICD-10-CM

## 2012-04-15 HISTORY — DX: Other chest pain: R07.89

## 2012-04-15 HISTORY — DX: Reserved for inherently not codable concepts without codable children: IMO0001

## 2012-04-15 LAB — CBC
HCT: 38 % (ref 36.0–46.0)
Hemoglobin: 12.5 g/dL (ref 12.0–15.0)
MCHC: 32.8 g/dL (ref 30.0–36.0)
MCV: 97.1 fl (ref 78.0–100.0)
Platelets: 232 10*3/uL (ref 150.0–400.0)
RBC: 3.91 Mil/uL (ref 3.87–5.11)
RDW: 13.8 % (ref 11.5–14.6)
WBC: 5.6 10*3/uL (ref 4.5–10.5)

## 2012-04-15 LAB — RENAL FUNCTION PANEL
Albumin: 4.5 g/dL (ref 3.5–5.2)
BUN: 15 mg/dL (ref 6–23)
CO2: 27 mEq/L (ref 19–32)
Calcium: 9.1 mg/dL (ref 8.4–10.5)
Chloride: 104 mEq/L (ref 96–112)
Creatinine, Ser: 0.8 mg/dL (ref 0.4–1.2)
GFR: 89.21 mL/min (ref >60.00)
Glucose, Bld: 78 mg/dL (ref 70–99)
Phosphorus: 3 mg/dL (ref 2.3–4.6)
Potassium: 5 mEq/L (ref 3.5–5.1)
Sodium: 138 mEq/L (ref 135–145)

## 2012-04-15 LAB — HEPATIC FUNCTION PANEL
ALT: 15 U/L (ref 0–35)
AST: 22 U/L (ref 0–37)
Albumin: 4.5 g/dL (ref 3.5–5.2)
Alkaline Phosphatase: 50 U/L (ref 39–117)
Bilirubin, Direct: 0 mg/dL (ref 0.0–0.3)
Total Bilirubin: 0.6 mg/dL (ref 0.3–1.2)
Total Protein: 7.7 g/dL (ref 6.0–8.3)

## 2012-04-15 LAB — T4, FREE: Free T4: 0.86 ng/dL (ref 0.60–1.60)

## 2012-04-15 LAB — LIPID PANEL
Cholesterol: 174 mg/dL (ref 0–200)
HDL: 98.7 mg/dL (ref >39.00)
LDL Cholesterol: 65 mg/dL (ref 0–99)
Total CHOL/HDL Ratio: 2
Triglycerides: 54 mg/dL (ref 0.0–149.0)
VLDL: 10.8 mg/dL (ref 0.0–40.0)

## 2012-04-15 LAB — TSH: TSH: 1.16 u[IU]/mL (ref 0.35–5.50)

## 2012-04-15 MED ORDER — RANITIDINE HCL 300 MG PO CAPS: 300.0000 mg | ORAL_CAPSULE | Freq: Every evening | ORAL | Status: DC

## 2012-04-15 MED ORDER — CYCLOBENZAPRINE HCL 10 MG PO TABS: 10.0000 mg | ORAL_TABLET | Freq: Every evening | ORAL | Status: AC | PRN

## 2012-04-15 NOTE — Assessment & Plan Note (Signed)
He is recently felt the omeprazole hasn't been helping as much. We will add ranitidine 300 mg each bedtime. She is asked to get some Mylanta to use as needed if she gets a sharp pain again. If this provides relief we are reassured this is GI. If her dyspepsia improved with ranitidine she can put the omeprazole 5 in the next 2 weeks.

## 2012-04-15 NOTE — Assessment & Plan Note (Signed)
Largely sounds musculoskeletal with some possible contribution for reflux. She notes her initial pain felt like a sharp pain between her shoulder blades. First episode occurred about 2 weeks ago and rather quickly without any significant persistent symptoms. She was fine until 3 days ago when she had the sharp pain again. Pain she describes is a sharp chest difficulty moving her arms. She does note improved she put her shoulders back. The pain and persistent toe now but is decreased in intensity at this time. Then over the last 2 days she noted some actual anterior chest pain it was more sharp it has actually resolved now. 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 She is encouraged to try heat and stretching is given Cyclobenzaprine to use qhs and to call if no improvement. Seek immediate care over weekend if symptoms worsen

## 2012-04-15 NOTE — Assessment & Plan Note (Signed)
Has cut back and has not smoked any for 3 days since her symptoms started, reminded again that the ultimate goal is none.

## 2012-04-15 NOTE — Progress Notes (Signed)
Patient ID: Samantha Morales, female   DOB: 1971/08/24, 41 y.o.   MRN: [id number] VICKI CHAFFIN [id number] Jul 20, 1971 04/15/2012      Progress Note-Follow Up  Subjective  Chief Complaint  Chief Complaint  Patient presents with  . Hypertension    BP issues  . Chest Pain    radiating from mid back to chest  . Headache    X 3 days  . Jaw Pain    X 2 days ago    HPI  Is a 41 year old Caucasian female who is in today complaining of atypical chest pain. She relates a history of an episode of sharp pain between her shoulders about 2 weeks ago. She is a hairdresser works long hours. She had a sharp pain and this wheezing feeling in her shoulder blades and it was intense enough to make it difficult to take a deep breath for about 5 minutes. That were off relatively quickly but then about 3 days ago she began to have more trouble between her shoulder blades began. In the intervening weeks was fine. Describes this pain between her shoulder blades this squeezing. It improves when she pulled her shoulder blades back. The pain has been present but has improved greatly since Wednesday. She had yesterday some sharp chest pain substernal. She relates more to heartburn. She feels the omeprazole has not been working well and she's been having more dyspepsia recently. She did not have any palpitations shortness of breath is gone. She did have a headache earlier in the week and took her blood pressure was noted to be as high as 153/98. Headache is now gone as well. She does not feel excessively worried when she took her blood pressure. She has been having a lot of neck pain recently as well. She also complains of tingling in her fingers but this is more often when she is working long shifts or upon first arising  Past Medical History  Diagnosis Date  . RESTLESS LEG SYNDROME, HX OF 01/20/2011  . LACERATION OF FINGER 01/27/2011  . GERD 01/20/2011  . DEPRESSION 01/20/2011  . CHICKENPOX, HX OF 01/20/2011  .  ANEMIA, HX OF 01/20/2011  . Tobacco abuse 05/12/2011  . Cellulitis of ankle 05/12/2011  . Atypical chest pain 04/15/2012  . Elevated BP 04/15/2012    Past Surgical History  Procedure Date  . Lasik eye surgery x b/l     Family History  Problem Relation Age of Onset  . Hypertension Mother   . Lupus Sister   . Hypertension Brother   . Cancer Maternal Grandmother     breast  . Alzheimer's disease Maternal Grandmother   . Aneurysm Maternal Grandfather     brain  . Arthritis Maternal Grandfather     rheumatoid  . Nephrolithiasis Paternal Grandmother   . Heart attack Paternal Grandfather   . Anemia Brother     History   Social History  . Marital Status: Married    Spouse Name: N/A    Number of Children: N/A  . Years of Education: N/A   Occupational History  . Not on file.   Social History Main Topics  . Smoking status: Former Smoker -- 0.5 packs/day    Quit date: 04/12/2012  . Smokeless tobacco: Never Used  . Alcohol Use: 0.0 oz/week    0 drink(s) per week     6-7 a week  . Drug Use: No  . Sexually Active: Yes -- Female partner(s)   Other Topics Concern  . Not  on file   Social History Narrative  . No narrative on file    Current Outpatient Prescriptions on File Prior to Visit  Medication Sig Dispense Refill  . Cholecalciferol (EQL VITAMIN D3) 1000 UNITS tablet Take 1,000 Units by mouth daily.        . citalopram (CELEXA) 20 MG tablet Take 20 mg by mouth daily.        Marland Kitchen FeFum-FePoly-FA-B Cmp-C-Biot (INTEGRA PLUS) CAPS Take by mouth daily.       Marland Kitchen ibuprofen (ADVIL,MOTRIN) 200 MG tablet Take 200 mg by mouth every 4 (four) hours as needed.        Marland Kitchen omeprazole (PRILOSEC) 20 MG capsule TAKE ONE CAPSULE BY MOUTH EVERY DAY  30 capsule  2  . ranitidine (ZANTAC) 300 MG capsule Take 1 capsule (300 mg total) by mouth every evening.  30 capsule  5    Allergies  Allergen Reactions  . Erythromycin     REACTION: rash  . Penicillins     REACTION: swelling, rash  .  Sulfonamide Derivatives     REACTION: GI upset    Review of Systems  Review of Systems  Constitutional: Negative for fever and malaise/fatigue.  HENT: Negative for congestion.   Eyes: Negative for discharge.  Respiratory: Positive for shortness of breath.        Only sob because dep breath hurt her back earlier in the week  Cardiovascular: Positive for chest pain. Negative for palpitations and leg swelling.  Gastrointestinal: Positive for heartburn and abdominal pain. Negative for nausea and diarrhea.  Genitourinary: Negative for dysuria.  Musculoskeletal: Positive for back pain. Negative for falls.  Skin: Negative for rash.  Neurological: Negative for loss of consciousness and headaches.  Endo/Heme/Allergies: Negative for polydipsia.  Psychiatric/Behavioral: Negative for depression and suicidal ideas. The patient is not nervous/anxious and does not have insomnia.     Objective  BP 118/79  Pulse 66  Temp(Src) 98.3 F (36.8 C) (Temporal)  Ht 5' 7.5" (1.715 m)  Wt 160 lb 12.8 oz (72.938 kg)  BMI 24.81 kg/m2  SpO2 96%  LMP 04/04/2012  Physical Exam  Physical Exam  Constitutional: She is oriented to person, place, and time and well-developed, well-nourished, and in no distress. No distress.  HENT:  Head: Normocephalic and atraumatic.  Eyes: Conjunctivae are normal.  Neck: Neck supple. No thyromegaly present.  Cardiovascular: Normal rate, regular rhythm and normal heart sounds.   No murmur heard. Pulmonary/Chest: Effort normal and breath sounds normal. She has no wheezes.  Abdominal: She exhibits no distension and no mass.  Musculoskeletal: She exhibits no edema.  Lymphadenopathy:    She has no cervical adenopathy.  Neurological: She is alert and oriented to person, place, and time.  Skin: Skin is warm and dry. No rash noted. She is not diaphoretic.  Psychiatric: Memory, affect and judgment normal.      Assessment & Plan  Tobacco abuse Has cut back and has not  smoked any for 3 days since her symptoms started, reminded again that the ultimate goal is none.   Atypical chest pain Largely sounds musculoskeletal with some possible contribution for reflux. She notes her initial pain felt like a sharp pain between her shoulder blades. First episode occurred about 2 weeks ago and rather quickly without any significant persistent symptoms. She was fine until 3 days ago when she had the sharp pain again. Pain she describes is a sharp chest difficulty moving her arms. She does note improved she put her shoulders back.  The pain and persistent toe now but is decreased in intensity at this time. Then over the last 2 days she noted some actual anterior chest pain it was more sharp it has actually resolved now. 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 She is encouraged to try heat and stretching is given Cyclobenzaprine to use qhs and to call if no improvement. Seek immediate care over weekend if symptoms worsen  Elevated BP Patient is reporting some numbers as hi as 153/98 earlier in the week, good here today but will check labs and consider a mild diuretic in the future if her numbers worsen and for the small amount of edema she is noting. Given a handout on the DASH dit to follow  GERD He is recently felt the omeprazole hasn't been helping as much. We will add ranitidine 300 mg each bedtime. She is asked to get some Mylanta to use as needed if she gets a sharp pain again. If this provides relief we are reassured this is GI. If her dyspepsia improved with ranitidine she can put the omeprazole 5 in the next 2 weeks.

## 2012-04-15 NOTE — Patient Instructions (Signed)
Gastroesophageal Reflux Disease, Adult Gastroesophageal reflux disease (GERD) happens when acid from your stomach flows up into the esophagus. When acid comes in contact with the esophagus, the acid causes soreness (inflammation) in the esophagus. Over time, GERD may create small holes (ulcers) in the lining of the esophagus. CAUSES   Increased body weight. This puts pressure on the stomach, making acid rise from the stomach into the esophagus.   Smoking. This increases acid production in the stomach.   Drinking alcohol. This causes decreased pressure in the lower esophageal sphincter (valve or ring of muscle between the esophagus and stomach), allowing acid from the stomach into the esophagus.   Late evening meals and a full stomach. This increases pressure and acid production in the stomach.   A malformed lower esophageal sphincter.  Sometimes, no cause is found. SYMPTOMS   Burning pain in the lower part of the mid-chest behind the breastbone and in the mid-stomach area. This may occur twice a week or more often.   Trouble swallowing.   Sore throat.   Dry cough.   Asthma-like symptoms including chest tightness, shortness of breath, or wheezing.  DIAGNOSIS  Your caregiver may be able to diagnose GERD based on your symptoms. In some cases, X-rays and other tests may be done to check for complications or to check the condition of your stomach and esophagus. TREATMENT  Your caregiver may recommend over-the-counter or prescription medicines to help decrease acid production. Ask your caregiver before starting or adding any new medicines.  HOME CARE INSTRUCTIONS   Change the factors that you can control. Ask your caregiver for guidance concerning weight loss, quitting smoking, and alcohol consumption.   Avoid foods and drinks that make your symptoms worse, such as:   Caffeine or alcoholic drinks.   Chocolate.   Peppermint or mint flavorings.   Garlic and onions.   Spicy foods.     Citrus fruits, such as oranges, lemons, or limes.   Tomato-based foods such as sauce, chili, salsa, and pizza.   Fried and fatty foods.   Avoid lying down for the 3 hours prior to your bedtime or prior to taking a nap.   Eat small, frequent meals instead of large meals.   Wear loose-fitting clothing. Do not wear anything tight around your waist that causes pressure on your stomach.   Raise the head of your bed 6 to 8 inches with wood blocks to help you sleep. Extra pillows will not help.   Only take over-the-counter or prescription medicines for pain, discomfort, or fever as directed by your caregiver.   Do not take aspirin, ibuprofen, or other nonsteroidal anti-inflammatory drugs (NSAIDs).  SEEK IMMEDIATE MEDICAL CARE IF:   You have pain in your arms, neck, jaw, teeth, or back.   Your pain increases or changes in intensity or duration.   You develop nausea, vomiting, or sweating (diaphoresis).   You develop shortness of breath, or you faint.   Your vomit is green, yellow, black, or looks like coffee grounds or blood.   Your stool is red, bloody, or black.  These symptoms could be signs of other problems, such as heart disease, gastric bleeding, or esophageal bleeding. MAKE SURE YOU:   Understand these instructions.   Will watch your condition.   Will get help right away if you are not doing well or get worse.  Document Released: 09/23/2005 Document Revised: 12/03/2011 Document Reviewed: 07/03/2011 [facility] Patient Information 2012 [place], [place].  Get some Mylanta to use for any chest  pain and reflux  Add a probiotic such as Align caps once daily, add fiber such as Benefiber

## 2012-04-15 NOTE — Assessment & Plan Note (Signed)
Patient is reporting some numbers as hi as 153/98 earlier in the week, good here today but will check labs and consider a mild diuretic in the future if her numbers worsen and for the small amount of edema she is noting. Given a handout on the DASH dit to follow

## 2012-04-27 ENCOUNTER — Telehealth: Payer: Self-pay | Admitting: Family Medicine

## 2012-04-27 NOTE — Telephone Encounter (Signed)
She is already taking the high dose of Ranitidine so she should add Omeprazole 20 mg po in am, disp# 30, 2 rf and see if that helps

## 2012-04-27 NOTE — Telephone Encounter (Signed)
Please advise 

## 2012-04-28 MED ORDER — OMEPRAZOLE 20 MG PO CPDR: 20.0000 mg | DELAYED_RELEASE_CAPSULE | Freq: Every day | ORAL | Status: DC

## 2012-04-28 NOTE — Telephone Encounter (Signed)
Pt informed and RX sent to pharmacy  

## 2012-05-17 ENCOUNTER — Ambulatory Visit: Payer: No Typology Code available for payment source | Admitting: Family Medicine

## 2012-07-13 ENCOUNTER — Telehealth: Payer: Self-pay

## 2012-07-13 NOTE — Telephone Encounter (Signed)
Opened in error

## 2012-07-22 ENCOUNTER — Ambulatory Visit (INDEPENDENT_AMBULATORY_CARE_PROVIDER_SITE_OTHER): Payer: No Typology Code available for payment source | Admitting: Family Medicine

## 2012-07-22 ENCOUNTER — Encounter: Payer: Self-pay | Admitting: Family Medicine

## 2012-07-22 ENCOUNTER — Telehealth: Payer: Self-pay | Admitting: Family Medicine

## 2012-07-22 ENCOUNTER — Other Ambulatory Visit: Payer: Self-pay

## 2012-07-22 VITALS — BP 124/84 | HR 69 | Temp 97.1°F | Ht 67.5 in | Wt 167.1 lb

## 2012-07-22 DIAGNOSIS — M549 Dorsalgia, unspecified: Secondary | ICD-10-CM

## 2012-07-22 DIAGNOSIS — M545 Low back pain, unspecified: Secondary | ICD-10-CM | POA: Insufficient documentation

## 2012-07-22 DIAGNOSIS — R030 Elevated blood-pressure reading, without diagnosis of hypertension: Secondary | ICD-10-CM

## 2012-07-22 DIAGNOSIS — M533 Sacrococcygeal disorders, not elsewhere classified: Secondary | ICD-10-CM

## 2012-07-22 DIAGNOSIS — IMO0001 Reserved for inherently not codable concepts without codable children: Secondary | ICD-10-CM

## 2012-07-22 HISTORY — DX: Sacrococcygeal disorders, not elsewhere classified: M53.3

## 2012-07-22 HISTORY — DX: Dorsalgia, unspecified: M54.9

## 2012-07-22 MED ORDER — CARISOPRODOL 350 MG PO TABS
350.0000 mg | ORAL_TABLET | Freq: Three times a day (TID) | ORAL | Status: AC | PRN
Start: 2012-07-22 — End: 2012-08-01

## 2012-07-22 MED ORDER — PREDNISONE 20 MG PO TABS: 20.0000 mg | ORAL_TABLET | Freq: Two times a day (BID) | ORAL | Status: AC

## 2012-07-22 MED ORDER — HYDROCODONE-ACETAMINOPHEN 5-325 MG PO TABS: 1.0000 | ORAL_TABLET | Freq: Four times a day (QID) | ORAL | Status: AC | PRN

## 2012-07-22 MED ORDER — OMEPRAZOLE 20 MG PO CPDR: 20.0000 mg | DELAYED_RELEASE_CAPSULE | Freq: Every day | ORAL | Status: DC

## 2012-07-22 NOTE — Assessment & Plan Note (Signed)
Adequately controlled despite current pain

## 2012-07-22 NOTE — Telephone Encounter (Signed)
Please advise 

## 2012-07-22 NOTE — Assessment & Plan Note (Signed)
Patient with worsening symptoms for couple days now. She's had pain over her left posterior hip for yeast today seen in today in past radicular symptoms down her left leg. 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 She tried Flexeril last night without relief. Has used naproxen without relief as well. We'll start her on prednisone 20 mg twice a day. Switch her Flexeril to soma 3 times a day as needed and warned that this may cause sedation as well. After to commit to taking at least at bedtime if no side effects are noted. She is to avoid NSAIDs while on prednisone but then may start back on naproxen. 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 Moist teeth, gentle stretching and continued movement are recommended. Report symptoms if not improving.

## 2012-07-22 NOTE — Telephone Encounter (Signed)
Patient aware.

## 2012-07-22 NOTE — Progress Notes (Signed)
Patient ID: Samantha Morales, female   DOB: June 04, 1971, 41 y.o.   MRN: [id number] Samantha Morales [id number] 1971/02/13 07/22/2012      Progress Note-Follow Up  Subjective  Chief Complaint  Chief Complaint  Patient presents with  . 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 She denies any falls or trauma. Her posterior left hip just beginning to hurt and continues to worsen in its intensity. She notes with certain movements or turns sharp pain appears. The rest of time there is some dull her pain. She is very heat patch and does think that will helpful. Did try some naproxen and did not find that helpful. Try her Flexeril last night and did not get any relief either. She has pain along the posterior sacroiliac joint and then the pain shoots down the back of the thigh about midway down the leg at times. Denies any falls denies any incontinence, fevers, belly pain or change in bowel habits. No other acute complaints and no numbness tingling or weakness noted in the foot.  Past Medical History  Diagnosis Date  . RESTLESS LEG SYNDROME, HX OF 01/20/2011  . LACERATION OF FINGER 01/27/2011  . GERD 01/20/2011  . DEPRESSION 01/20/2011  . CHICKENPOX, HX OF 01/20/2011  . ANEMIA, HX OF 01/20/2011  . Tobacco abuse 05/12/2011  . Cellulitis of ankle 05/12/2011  . Atypical chest pain 04/15/2012  . Elevated BP 04/15/2012  . Sacroiliac joint pain 07/22/2012    Past Surgical History  Procedure Date  . Lasik eye surgery x b/l     Family History  Problem Relation Age of Onset  . Hypertension Mother   . Lupus Sister   . Hypertension Brother   . Cancer Maternal Grandmother     breast  . Alzheimer's disease Maternal Grandmother   . Aneurysm Maternal Grandfather     brain  . Arthritis Maternal Grandfather     rheumatoid  . Nephrolithiasis Paternal Grandmother   .  Heart attack Paternal Grandfather   . Anemia Brother     History   Social History  . Marital Status: Married    Spouse Name: N/A    Number of Children: N/A  . Years of Education: N/A   Occupational History  . Not on file.   Social History Main Topics  . Smoking status: Former Smoker -- 0.5 packs/day    Quit date: 04/12/2012  . Smokeless tobacco: Never Used  . Alcohol Use: 0.0 oz/week    0 drink(s) per week     6-7 a week  . Drug Use: No  . Sexually Active: Yes -- Female partner(s)   Other Topics Concern  . Not on file   Social History Narrative  . No narrative on file    Current Outpatient Prescriptions on File Prior to Visit  Medication Sig Dispense Refill  . acetaminophen (TYLENOL) 500 MG tablet Take 500 mg by mouth every 6 (six) hours as needed.      . citalopram (CELEXA) 20 MG tablet Take 20 mg by mouth daily.        Marland Kitchen omeprazole (PRILOSEC) 20 MG capsule Take 1 capsule (20 mg total) by mouth daily.  30 capsule  2  . ranitidine (ZANTAC) 300 MG capsule Take 1 capsule (300 mg total) by mouth every evening.  30 capsule  5  . Cholecalciferol (EQL VITAMIN D3) 1000 UNITS tablet Take 1,000 Units by mouth daily.          Allergies  Allergen Reactions  . Erythromycin     REACTION: rash  . Penicillins     REACTION: swelling, rash  . Sulfonamide Derivatives     REACTION: GI upset    Review of Systems  Review of Systems  Constitutional: Negative for fever and malaise/fatigue.  HENT: Negative for congestion and neck pain.   Eyes: Negative for discharge.  Respiratory: Negative for shortness of breath.   Cardiovascular: Negative for chest pain, palpitations and leg swelling.  Gastrointestinal: Negative for heartburn, nausea, abdominal pain, diarrhea, constipation and blood in stool.  Genitourinary: Negative for dysuria, urgency, frequency and hematuria.  Musculoskeletal: Positive for back pain. Negative for myalgias and falls.       Left posterior hip pain worse with  certain position changes and some radicular pain down back of left leg at times  Skin: Negative for rash.  Neurological: Negative for loss of consciousness and headaches.  Endo/Heme/Allergies: Negative for polydipsia.  Psychiatric/Behavioral: Negative for depression and suicidal ideas. The patient is not nervous/anxious and does not have insomnia.     Objective  BP 124/84  Pulse 69  Temp 97.1 F (36.2 C) (Temporal)  Ht 5' 7.5" (1.715 m)  Wt 167 lb 1.9 oz (75.805 kg)  BMI 25.79 kg/m2  SpO2 100%  LMP 07/18/2012  Physical Exam  Physical Exam  Constitutional: She is oriented to person, place, and time and well-developed, well-nourished, and in no distress. No distress.  HENT:  Head: Normocephalic and atraumatic.  Eyes: Conjunctivae are normal.  Neck: Neck supple. No thyromegaly present.  Cardiovascular: Normal rate, regular rhythm and normal heart sounds.   No murmur heard. Pulmonary/Chest: Effort normal and breath sounds normal. She has no wheezes.  Abdominal: She exhibits no distension and no mass.  Musculoskeletal: She exhibits tenderness. She exhibits no edema.       Tender with palpation over posterior left sacroiliac joint  Lymphadenopathy:    She has no cervical adenopathy.  Neurological: She is alert and oriented to person, place, and time.  Skin: Skin is warm and dry. No rash noted. She is not diaphoretic.  Psychiatric: Memory, affect and judgment normal.    Lab Results  Component Value Date   TSH 1.16 04/15/2012   Lab Results  Component Value Date   WBC 5.6 04/15/2012   HGB 12.5 04/15/2012   HCT 38.0 04/15/2012   MCV 97.1 04/15/2012   PLT 232.0 04/15/2012   Lab Results  Component Value Date   CREATININE 0.8 04/15/2012   BUN 15 04/15/2012   NA 138 04/15/2012   K 5.0 04/15/2012   CL 104 04/15/2012   CO2 27 04/15/2012   Lab Results  Component Value Date   ALT 15 04/15/2012   AST 22 04/15/2012   ALKPHOS 50 04/15/2012   BILITOT 0.6 04/15/2012   Lab Results    Component Value Date   CHOL 174 04/15/2012   Lab Results  Component Value Date   HDL 98.70 04/15/2012   Lab Results  Component Value Date   LDLCALC 65 04/15/2012   Lab Results  Component Value Date   TRIG 54.0 04/15/2012   Lab Results  Component Value Date   CHOLHDL 2 04/15/2012     Assessment & Plan  Sacroiliac joint pain  Patient with worsening symptoms for couple days now. She's had pain over her left posterior  hip for yeast today seen in today in past radicular symptoms down her left leg. 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 She tried Flexeril last night without relief. Has used naproxen without relief as well. We'll start her on prednisone 20 mg twice a day. Switch her Flexeril to soma 3 times a day as needed and warned that this may cause sedation as well. After to commit to taking at least at bedtime if no side effects are noted. She is to avoid NSAIDs while on prednisone but then may start back on naproxen. 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 Moist teeth, gentle stretching and continued movement are recommended. Report symptoms if not improving.  Elevated BP Adequately controlled despite current pain

## 2012-07-22 NOTE — Patient Instructions (Addendum)
Sacroiliac Joint Dysfunction The sacroiliac joint connects the lower part of the spine (the sacrum) with the bones of the pelvis. CAUSES  Sometimes, there is no obvious reason for sacroiliac joint dysfunction. Other times, it may occur   During pregnancy.   After injury, such as:   Car accidents.   Sport-related injuries.   Work-related injuries.   Due to one leg being shorter than the other.   Due to other conditions that affect the joints, such as:   Rheumatoid arthritis.   Gout.   Psoriasis.   Joint infection (septic arthritis).  SYMPTOMS  Symptoms may include:  Pain in the:   Lower back.   Buttocks.   Groin.   Thighs and legs.   Difficult sitting, standing, walking, lying, bending or lifting.  DIAGNOSIS  A number of tests may be used to help diagnose the cause of sacroiliac joint dysfunction, including:  Imaging tests to look for other causes of pain, including:   MRI.   CT scan.   Bone scan.   Diagnostic injection: During a special x-ray (called fluoroscopy), a needle is put into the sacroiliac joint. A numbing medicine is injected into the joint. If the pain is improved or stopped, the diagnosis of sacroiliac joint dysfunction is more likely.  TREATMENT  There are a number of types of treatment used for sacroiliac joint dysfunction, including:  Only take over-the-counter or prescription medicines for pain, discomfort, or fever as directed by your caregiver.   Medications to relax muscles.   Rest. Decreasing activity can help cut down on painful muscle spasms and allow the back to heal.   Application of heat or ice to the lower back may improve muscle spasms and soothe pain.   Brace. A special back brace, called a sacroiliac belt, can help support the joint while your back is healing.   Physical therapy can help teach comfortable positions and exercises to strengthen muscles that support the sacroiliac joint.   Cortisone injections. Injections  of steroid medicine into the joint can help decrease swelling and improve pain.   Hyaluronic acid injections. This chemical improves lubrication within the sacroiliac joint, thereby decreasing pain.   Radiofrequency ablation. A special needle is placed into the joint, where it burns away nerves that are carrying pain messages from the joint.   Surgery. Because pain occurs during movement of the joint, screws and plates may be installed in order to limit or prevent joint motion.  HOME CARE INSTRUCTIONS   Take all medications exactly as directed.   Follow instructions regarding both rest and physical activity, to avoid worsening the pain.   Do physical therapy exercises exactly as prescribed.  SEEK IMMEDIATE MEDICAL CARE IF:  You experience increasingly severe pain.   You develop new symptoms, such as numbness or tingling in your legs or feet.   You lose bladder or bowel control.  Document Released: 03/12/2009 Document Revised: 12/03/2011 Document Reviewed: 03/12/2009 [employer] Patient Information 2012 [employer]. 

## 2012-07-22 NOTE — Telephone Encounter (Signed)
Ok per Dr Abner Greenspan. Nikki informed

## 2012-12-12 ENCOUNTER — Encounter: Payer: Self-pay | Admitting: Family Medicine

## 2012-12-12 ENCOUNTER — Ambulatory Visit (INDEPENDENT_AMBULATORY_CARE_PROVIDER_SITE_OTHER): Payer: No Typology Code available for payment source | Admitting: Family Medicine

## 2012-12-12 VITALS — BP 121/83 | HR 74 | Temp 99.4°F | Ht 67.75 in | Wt 169.8 lb

## 2012-12-12 DIAGNOSIS — G4700 Insomnia, unspecified: Secondary | ICD-10-CM

## 2012-12-12 DIAGNOSIS — Z720 Tobacco use: Secondary | ICD-10-CM

## 2012-12-12 DIAGNOSIS — R5383 Other fatigue: Secondary | ICD-10-CM

## 2012-12-12 DIAGNOSIS — F17200 Nicotine dependence, unspecified, uncomplicated: Secondary | ICD-10-CM

## 2012-12-12 DIAGNOSIS — Z862 Personal history of diseases of the blood and blood-forming organs and certain disorders involving the immune mechanism: Secondary | ICD-10-CM

## 2012-12-12 DIAGNOSIS — R030 Elevated blood-pressure reading, without diagnosis of hypertension: Secondary | ICD-10-CM

## 2012-12-12 DIAGNOSIS — K219 Gastro-esophageal reflux disease without esophagitis: Secondary | ICD-10-CM

## 2012-12-12 DIAGNOSIS — R5381 Other malaise: Secondary | ICD-10-CM

## 2012-12-12 DIAGNOSIS — J019 Acute sinusitis, unspecified: Secondary | ICD-10-CM

## 2012-12-12 DIAGNOSIS — IMO0001 Reserved for inherently not codable concepts without codable children: Secondary | ICD-10-CM

## 2012-12-12 HISTORY — DX: Insomnia, unspecified: G47.00

## 2012-12-12 HISTORY — DX: Acute sinusitis, unspecified: J01.90

## 2012-12-12 LAB — CBC
HCT: 31.3 % — ABNORMAL LOW (ref 36.0–46.0)
Hemoglobin: 10.6 g/dL — ABNORMAL LOW (ref 12.0–15.0)
MCHC: 33.9 g/dL (ref 30.0–36.0)
MCV: 90.8 fl (ref 78.0–100.0)
Platelets: 194 10*3/uL (ref 150.0–400.0)
RBC: 3.45 Mil/uL — ABNORMAL LOW (ref 3.87–5.11)
RDW: 15.4 % — ABNORMAL HIGH (ref 11.5–14.6)
WBC: 7.2 10*3/uL (ref 4.5–10.5)

## 2012-12-12 LAB — TSH: TSH: 1 u[IU]/mL (ref 0.35–5.50)

## 2012-12-12 MED ORDER — HYDROCOD POLST-CHLORPHEN POLST 10-8 MG/5ML PO LQCR: 5.0000 mL | Freq: Every evening | ORAL | Status: DC | PRN

## 2012-12-12 MED ORDER — ZOLPIDEM TARTRATE 10 MG PO TABS: 10.0000 mg | ORAL_TABLET | Freq: Every evening | ORAL | Status: DC | PRN

## 2012-12-12 MED ORDER — OMEPRAZOLE 20 MG PO CPDR: 20.0000 mg | DELAYED_RELEASE_CAPSULE | Freq: Every day | ORAL | Status: DC

## 2012-12-12 MED ORDER — AZITHROMYCIN 250 MG PO TABS: ORAL_TABLET | ORAL | Status: DC

## 2012-12-12 MED ORDER — GUAIFENESIN ER 600 MG PO TB12: 1200.0000 mg | ORAL_TABLET | Freq: Two times a day (BID) | ORAL | Status: DC

## 2012-12-12 NOTE — Patient Instructions (Addendum)

## 2012-12-12 NOTE — Assessment & Plan Note (Signed)
Report she gave blood recently and they did not take her blood secondary to anemia repeat CBC today

## 2012-12-12 NOTE — Progress Notes (Signed)
Patient ID: Samantha Morales, female   DOB: [birth year], 41 y.o.   MRN: [id number] TELIA AMUNDSON [id number] 1971/04/26 12/12/2012      Progress Note-Follow Up  Subjective  Chief Complaint  Chief Complaint  Patient presents with  . Sinusitis    HPI  Patient is a 41 year old Caucasian female who is in today complaining of 3 days worth of worsening upper is history symptoms. She has a lot of fatigue and malaise. She's had some intermittent chills, ear pain, throat pain and headache. His cough which is generally dry it isn't productive. The cough is severe enough to keep her up and cause some chest discomfort when she's coughing. No wheezing. No GI symptoms. She has tried a pleasant summer to see without any great effect. She's also complaining of several months with a insomnia. Has trouble falling and staying asleep. Has tried Unisom without any great improvement. Denies any episodes of this being instigated by, or schedule change  Past Medical History  Diagnosis Date  . RESTLESS LEG SYNDROME, HX OF 01/20/2011  . LACERATION OF FINGER 01/27/2011  . GERD 01/20/2011  . DEPRESSION 01/20/2011  . CHICKENPOX, HX OF 01/20/2011  . ANEMIA, HX OF 01/20/2011  . Tobacco abuse 05/12/2011  . Cellulitis of ankle 05/12/2011  . Atypical chest pain 04/15/2012  . Elevated BP 04/15/2012  . Sacroiliac joint pain 07/22/2012  . Sinusitis, acute 12/12/2012  . Insomnia 12/12/2012    Past Surgical History  Procedure Date  . Lasik eye surgery x b/l     Family History  Problem Relation Age of Onset  . Hypertension Mother   . Lupus Sister   . Hypertension Brother   . Cancer Maternal Grandmother     breast  . Alzheimer's disease Maternal Grandmother   . Aneurysm Maternal Grandfather     brain  . Arthritis Maternal Grandfather     rheumatoid  . Nephrolithiasis Paternal Grandmother   . Heart attack Paternal Grandfather   . Anemia Brother     History   Social History  . Marital Status: Married    Spouse  Name: N/A    Number of Children: N/A  . Years of Education: N/A   Occupational History  . Not on file.   Social History Main Topics  . Smoking status: Current Some Day Smoker -- 0.5 packs/day    Last Attempt to Quit: 04/12/2012  . Smokeless tobacco: Never Used  . Alcohol Use: 0.0 oz/week    0 drink(s) per week     Comment: 6-7 a week  . Drug Use: No  . Sexually Active: Yes -- Female partner(s)   Other Topics Concern  . Not on file   Social History Narrative  . No narrative on file    Current Outpatient Prescriptions on File Prior to Visit  Medication Sig Dispense Refill  . Cholecalciferol (EQL VITAMIN D3) 1000 UNITS tablet Take 1,000 Units by mouth daily.        . citalopram (CELEXA) 20 MG tablet Take 20 mg by mouth daily.        Marland Kitchen doxylamine, Sleep, (UNISOM) 25 MG tablet Take 25 mg by mouth at bedtime as needed.      . Ferrous Sulfate (IRON) 325 (65 FE) MG TABS Take 1 tablet by mouth daily.      Marland Kitchen omeprazole (PRILOSEC) 20 MG capsule Take 1 capsule (20 mg total) by mouth daily.  30 capsule  4  . ranitidine (ZANTAC) 300 MG capsule Take 1 capsule (300 mg  total) by mouth every evening.  30 capsule  5  . zolpidem (AMBIEN) 10 MG tablet Take 1 tablet (10 mg total) by mouth at bedtime as needed for sleep.  30 tablet  1    Allergies  Allergen Reactions  . Erythromycin     REACTION: rash  . Penicillins     REACTION: swelling, rash  . Sulfonamide Derivatives     REACTION: GI upset    Review of Systems  Review of Systems  Constitutional: Positive for chills and malaise/fatigue. Negative for fever.  HENT: Positive for congestion.   Eyes: Negative for discharge.  Respiratory: Positive for cough and shortness of breath.   Cardiovascular: Negative for chest pain, palpitations and leg swelling.  Gastrointestinal: Negative for nausea, abdominal pain and diarrhea.  Genitourinary: Negative for dysuria.  Musculoskeletal: Negative for falls.  Skin: Negative for rash.  Neurological:  Positive for headaches. Negative for loss of consciousness.  Endo/Heme/Allergies: Negative for polydipsia.  Psychiatric/Behavioral: Negative for depression and suicidal ideas. The patient has insomnia. The patient is not nervous/anxious.     Objective  BP 121/83  Pulse 74  Temp 99.4 F (37.4 C) (Temporal)  Ht 5' 7.75" (1.721 m)  Wt 169 lb 12.8 oz (77.021 kg)  BMI 26.01 kg/m2  SpO2 97%  LMP 11/28/2012  Physical Exam  Physical Exam  Constitutional: She is oriented to person, place, and time and well-developed, well-nourished, and in no distress. No distress.  HENT:  Head: Normocephalic and atraumatic.  Eyes: Conjunctivae normal are normal.  Neck: Neck supple. No thyromegaly present.  Cardiovascular: Normal rate, regular rhythm and normal heart sounds.   No murmur heard. Pulmonary/Chest: Effort normal and breath sounds normal. She has no wheezes.  Abdominal: She exhibits no distension and no mass.  Musculoskeletal: She exhibits no edema.  Lymphadenopathy:    She has no cervical adenopathy.  Neurological: She is alert and oriented to person, place, and time.  Skin: Skin is warm and dry. No rash noted. She is not diaphoretic.  Psychiatric: Memory, affect and judgment normal.    Lab Results  Component Value Date   TSH 1.16 04/15/2012   Lab Results  Component Value Date   WBC 5.6 04/15/2012   HGB 12.5 04/15/2012   HCT 38.0 04/15/2012   MCV 97.1 04/15/2012   PLT 232.0 04/15/2012   Lab Results  Component Value Date   CREATININE 0.8 04/15/2012   BUN 15 04/15/2012   NA 138 04/15/2012   K 5.0 04/15/2012   CL 104 04/15/2012   CO2 27 04/15/2012   Lab Results  Component Value Date   ALT 15 04/15/2012   AST 22 04/15/2012   ALKPHOS 50 04/15/2012   BILITOT 0.6 04/15/2012   Lab Results  Component Value Date   CHOL 174 04/15/2012   Lab Results  Component Value Date   HDL 98.70 04/15/2012   Lab Results  Component Value Date   LDLCALC 65 04/15/2012   Lab Results  Component Value  Date   TRIG 54.0 04/15/2012   Lab Results  Component Value Date   CHOLHDL 2 04/15/2012     Assessment & Plan  Sinusitis, acute Started on antibiotics, Tussionex, and start Mucinex  Tobacco abuse Very infrequent at this time, encouraged complete cessation  GERD Well controlled on Omeprazole, given refill todya  Elevated BP Well controlled today  Insomnia Sudden onset and last couple months without any acute triggers. Has tried Unisom without effect. We'll try to heme 5-10 mg. Encouraged nightly use  for 7 days and then titrate off as tolerated. Encouraged good sleep hygiene small frequent meals but not limited to full etc. Check TSH  ANEMIA, HX OF Report she gave blood recently and they did not take her blood secondary to anemia repeat CBC today

## 2012-12-12 NOTE — Assessment & Plan Note (Signed)
Well controlled on Omeprazole, given refill todya

## 2012-12-12 NOTE — Assessment & Plan Note (Signed)
Started on antibiotics, Tussionex, and start Mucinex

## 2012-12-12 NOTE — Assessment & Plan Note (Signed)
Very infrequent at this time, encouraged complete cessation

## 2012-12-12 NOTE — Assessment & Plan Note (Signed)
Sudden onset and last couple months without any acute triggers. Has tried Unisom without effect. We'll try to heme 5-10 mg. Encouraged nightly use for 7 days and then titrate off as tolerated. Encouraged good sleep hygiene small frequent meals but not limited to full etc. Check TSH

## 2012-12-12 NOTE — Assessment & Plan Note (Signed)
Well controlled today.

## 2012-12-13 MED ORDER — FERROUS FUMARATE-FOLIC ACID 324-1 MG PO TABS: 1.0000 | ORAL_TABLET | Freq: Every day | ORAL | Status: DC

## 2012-12-13 NOTE — Addendum Note (Signed)
Addended by: Court Joy on: 12/13/2012 03:46 PM   Modules accepted: Orders

## 2012-12-21 ENCOUNTER — Other Ambulatory Visit: Payer: Self-pay | Admitting: Family Medicine

## 2013-02-11 ENCOUNTER — Other Ambulatory Visit: Payer: Self-pay

## 2013-03-28 LAB — HM PAP SMEAR: HM Pap smear: NORMAL

## 2013-05-11 ENCOUNTER — Other Ambulatory Visit: Payer: Self-pay | Admitting: Family Medicine

## 2013-05-12 NOTE — Telephone Encounter (Signed)
Please advise refill? Last RX was done on 12-12-13 quantity 30 with 1 refill  If ok fax to [phone]

## 2013-09-12 ENCOUNTER — Other Ambulatory Visit: Payer: Self-pay | Admitting: Family Medicine

## 2013-09-12 ENCOUNTER — Other Ambulatory Visit: Payer: Self-pay | Admitting: *Deleted

## 2013-09-12 MED ORDER — ZOLPIDEM TARTRATE 10 MG PO TABS: 10.0000 mg | ORAL_TABLET | Freq: Every evening | ORAL | Status: DC | PRN

## 2013-09-12 NOTE — Telephone Encounter (Signed)
RX faxed and detailed message left on patients vm

## 2013-09-12 NOTE — Telephone Encounter (Signed)
OK to refill Ambien with #[phone] 1 rf but warn has to be seen before more

## 2013-10-31 ENCOUNTER — Encounter: Payer: Self-pay | Admitting: Family Medicine

## 2013-10-31 ENCOUNTER — Ambulatory Visit (HOSPITAL_BASED_OUTPATIENT_CLINIC_OR_DEPARTMENT_OTHER)
Admission: RE | Admit: 2013-10-31 | Discharge: 2013-10-31 | Disposition: A | Discharge status: Home / Self Care | Payer: BC Managed Care – PPO | Source: Ambulatory Visit | Attending: Family Medicine | Admitting: Family Medicine

## 2013-10-31 ENCOUNTER — Ambulatory Visit (INDEPENDENT_AMBULATORY_CARE_PROVIDER_SITE_OTHER): Payer: BC Managed Care – PPO | Admitting: Family Medicine

## 2013-10-31 VITALS — BP 118/70 | HR 61 | Temp 98.1°F | Ht 67.5 in | Wt 176.0 lb

## 2013-10-31 DIAGNOSIS — M47817 Spondylosis without myelopathy or radiculopathy, lumbosacral region: Secondary | ICD-10-CM | POA: Insufficient documentation

## 2013-10-31 DIAGNOSIS — IMO0001 Reserved for inherently not codable concepts without codable children: Secondary | ICD-10-CM

## 2013-10-31 DIAGNOSIS — R109 Unspecified abdominal pain: Secondary | ICD-10-CM | POA: Insufficient documentation

## 2013-10-31 DIAGNOSIS — R030 Elevated blood-pressure reading, without diagnosis of hypertension: Secondary | ICD-10-CM

## 2013-10-31 DIAGNOSIS — G4700 Insomnia, unspecified: Secondary | ICD-10-CM

## 2013-10-31 DIAGNOSIS — M549 Dorsalgia, unspecified: Secondary | ICD-10-CM

## 2013-10-31 DIAGNOSIS — R1032 Left lower quadrant pain: Secondary | ICD-10-CM

## 2013-10-31 MED ORDER — ZOLPIDEM TARTRATE 10 MG PO TABS: 10.0000 mg | ORAL_TABLET | Freq: Every evening | ORAL | Status: DC | PRN

## 2013-10-31 NOTE — Patient Instructions (Signed)
Salon pas patches as needed to hip   Sacroiliac Joint Dysfunction The sacroiliac joint connects the lower part of the spine (the sacrum) with the bones of the pelvis. CAUSES  Sometimes, there is no obvious reason for sacroiliac joint dysfunction. Other times, it may occur   During pregnancy.  After injury, such as:  Car accidents.  Sport-related injuries.  Work-related injuries.  Due to one leg being shorter than the other.  Due to other conditions that affect the joints, such as:  Rheumatoid arthritis.  Gout.  Psoriasis.  Joint infection (septic arthritis). SYMPTOMS  Symptoms may include:  Pain in the:  Lower back.  Buttocks.  Groin.  Thighs and legs.  Difficult sitting, standing, walking, lying, bending or lifting. DIAGNOSIS  A number of tests may be used to help diagnose the cause of sacroiliac joint dysfunction, including:  Imaging tests to look for other causes of pain, including:  MRI.  CT scan.  Bone scan.  Diagnostic injection: During a special x-ray (called fluoroscopy), a needle is put into the sacroiliac joint. A numbing medicine is injected into the joint. If the pain is improved or stopped, the diagnosis of sacroiliac joint dysfunction is more likely. TREATMENT  There are a number of types of treatment used for sacroiliac joint dysfunction, including:  Only take over-the-counter or prescription medicines for pain, discomfort, or fever as directed by your caregiver.  Medications to relax muscles.  Rest. Decreasing activity can help cut down on painful muscle spasms and allow the back to heal.  Application of heat or ice to the lower back may improve muscle spasms and soothe pain.  Brace. A special back brace, called a sacroiliac belt, can help support the joint while your back is healing.  Physical therapy can help teach comfortable positions and exercises to strengthen muscles that support the sacroiliac joint.  Cortisone injections.  Injections of steroid medicine into the joint can help decrease swelling and improve pain.  Hyaluronic acid injections. This chemical improves lubrication within the sacroiliac joint, thereby decreasing pain.  Radiofrequency ablation. A special needle is placed into the joint, where it burns away nerves that are carrying pain messages from the joint.  Surgery. Because pain occurs during movement of the joint, screws and plates may be installed in order to limit or prevent joint motion. HOME CARE INSTRUCTIONS   Take all medications exactly as directed.  Follow instructions regarding both rest and physical activity, to avoid worsening the pain.  Do physical therapy exercises exactly as prescribed. SEEK IMMEDIATE MEDICAL CARE IF:  You experience increasingly severe pain.  You develop new symptoms, such as numbness or tingling in your legs or feet.  You lose bladder or bowel control. Document Released: 03/12/2009 Document Revised: 03/07/2012 Document Reviewed: 03/12/2009 [facility] Patient Information 2014 [place], [place].

## 2013-11-01 LAB — URINALYSIS
Bilirubin Urine: NEGATIVE
Glucose, UA: NEGATIVE mg/dL
Hgb urine dipstick: NEGATIVE
Ketones, ur: NEGATIVE mg/dL
Leukocytes, UA: NEGATIVE
Nitrite: NEGATIVE
Protein, ur: NEGATIVE mg/dL
Specific Gravity, Urine: 1.011 (ref 1.005–1.030)
Urobilinogen, UA: 0.2 mg/dL (ref 0.0–1.0)
pH: 6 (ref 5.0–8.0)

## 2013-11-01 LAB — URINE CULTURE
Colony Count: NO GROWTH
Organism ID, Bacteria: NO GROWTH

## 2013-11-05 ENCOUNTER — Encounter: Payer: Self-pay | Admitting: Family Medicine

## 2013-11-05 NOTE — Progress Notes (Signed)
Patient ID: Samantha Morales, female   DOB: [birth year], 42 y.o.   MRN: [id number] Samantha Morales [id number] 1971/12/13 11/05/2013      Progress Note-Follow Up  Subjective  Chief Complaint  Chief Complaint  Patient presents with  . Urinary Tract Infection    kidney infection    HPI  Patient is a 42 year old female concerned about some urinary symptoms and some back pain. Patient notes a combination of urinary hesitancy and urgency intermittently. NO dysuria, no fevers/chills/malaise/myalgias. No GI c/c. Has been taking some cranberry tabs. Is asking for a refill on Ambien. Works well. Just had flu shot. Has had some left sided back pain intermittently for about 3 days. She describes it as stabling and affected by position changes. No cp/palp/sob  Past Medical History  Diagnosis Date  . RESTLESS LEG SYNDROME, HX OF 01/20/2011  . LACERATION OF FINGER 01/27/2011  . GERD 01/20/2011  . DEPRESSION 01/20/2011  . CHICKENPOX, HX OF 01/20/2011  . ANEMIA, HX OF 01/20/2011  . Tobacco abuse 05/12/2011  . Cellulitis of ankle 05/12/2011  . Atypical chest pain 04/15/2012  . Elevated BP 04/15/2012  . Sacroiliac joint pain 07/22/2012  . Sinusitis, acute 12/12/2012  . Insomnia 12/12/2012  . Back pain 07/22/2012    Past Surgical History  Procedure Laterality Date  . Lasik eye surgery x b/l      Family History  Problem Relation Age of Onset  . Hypertension Mother   . Lupus Sister   . Hypertension Brother   . Cancer Maternal Grandmother     breast  . Alzheimer's disease Maternal Grandmother   . Aneurysm Maternal Grandfather     brain  . Arthritis Maternal Grandfather     rheumatoid  . Nephrolithiasis Paternal Grandmother   . Heart attack Paternal Grandfather   . Anemia Brother     History   Social History  . Marital Status: Married    Spouse Name: N/A    Number of Children: N/A  . Years of Education: N/A   Occupational History  . Not on file.   Social History Main Topics  . Smoking  status: Current Some Day Smoker -- 0.50 packs/day    Last Attempt to Quit: 04/12/2012  . Smokeless tobacco: Never Used  . Alcohol Use: 0.0 oz/week    0 drink(s) per week     Comment: 6-7 a week  . Drug Use: No  . Sexual Activity: Yes    Partners: Male   Other Topics Concern  . Not on file   Social History Narrative  . No narrative on file    Current Outpatient Prescriptions on File Prior to Visit  Medication Sig Dispense Refill  . Cholecalciferol (EQL VITAMIN D3) 1000 UNITS tablet Take 1,000 Units by mouth daily.        . citalopram (CELEXA) 20 MG tablet Take 20 mg by mouth daily.        . Ferrous Fumarate-Folic Acid 324-1 MG TABS Take 1 tablet by mouth daily.  30 each  2  . ibuprofen (ADVIL,MOTRIN) 200 MG tablet Take 200 mg by mouth every 6 (six) hours as needed.      Marland Kitchen omeprazole (PRILOSEC) 20 MG capsule TAKE 1 CAPSULE (20 MG TOTAL) BY MOUTH DAILY.  30 capsule  4   No current facility-administered medications on file prior to visit.    Allergies  Allergen Reactions  . Erythromycin     REACTION: rash  . Penicillins     REACTION: swelling,  rash  . Sulfonamide Derivatives     REACTION: GI upset    Review of Systems  Review of Systems  Constitutional: Negative for fever and malaise/fatigue.  HENT: Negative for congestion.   Eyes: Negative for discharge.  Respiratory: Negative for shortness of breath.   Cardiovascular: Negative for chest pain, palpitations and leg swelling.  Gastrointestinal: Negative for nausea, abdominal pain and diarrhea.  Genitourinary: Positive for urgency. Negative for dysuria.  Musculoskeletal: Positive for back pain. Negative for falls.  Skin: Negative for rash.  Neurological: Negative for loss of consciousness and headaches.  Endo/Heme/Allergies: Negative for polydipsia.  Psychiatric/Behavioral: Negative for depression and suicidal ideas. The patient is not nervous/anxious and does not have insomnia.     Objective  BP 118/70  Pulse 61   Temp(Src) 98.1 F (36.7 C) (Oral)  Ht 5' 7.5" (1.715 m)  Wt 176 lb 0.6 oz (79.851 kg)  BMI 27.15 kg/m2  SpO2 99%  LMP 10/27/2013  Physical Exam  Physical Exam  Constitutional: She is oriented to person, place, and time and well-developed, well-nourished, and in no distress. No distress.  HENT:  Head: Normocephalic and atraumatic.  Eyes: Conjunctivae are normal.  Neck: Neck supple. No thyromegaly present.  Cardiovascular: Normal rate, regular rhythm and normal heart sounds.   No murmur heard. Pulmonary/Chest: Effort normal and breath sounds normal. She has no wheezes.  Abdominal: She exhibits no distension and no mass.  Musculoskeletal: She exhibits no edema.  Lymphadenopathy:    She has no cervical adenopathy.  Neurological: She is alert and oriented to person, place, and time.  Skin: Skin is warm and dry. No rash noted. She is not diaphoretic.  Psychiatric: Memory, affect and judgment normal.    Lab Results  Component Value Date   TSH 1.00 12/12/2012   Lab Results  Component Value Date   WBC 7.2 12/12/2012   HGB 10.6* 12/12/2012   HCT 31.3* 12/12/2012   MCV 90.8 12/12/2012   PLT 194.0 12/12/2012   Lab Results  Component Value Date   CREATININE 0.8 04/15/2012   BUN 15 04/15/2012   NA 138 04/15/2012   K 5.0 04/15/2012   CL 104 04/15/2012   CO2 27 04/15/2012   Lab Results  Component Value Date   ALT 15 04/15/2012   AST 22 04/15/2012   ALKPHOS 50 04/15/2012   BILITOT 0.6 04/15/2012   Lab Results  Component Value Date   CHOL 174 04/15/2012   Lab Results  Component Value Date   HDL 98.70 04/15/2012   Lab Results  Component Value Date   LDLCALC 65 04/15/2012   Lab Results  Component Value Date   TRIG 54.0 04/15/2012   Lab Results  Component Value Date   CHOLHDL 2 04/15/2012     Assessment & Plan  Elevated BP Well controlled today  Back pain Mostly left isded affected by position changes. Moist heat, gentle stretching, [place], xray unremarkable. Report  worsening symptoms  Insomnia Given refill on Ambien

## 2013-11-05 NOTE — Assessment & Plan Note (Signed)
Well controlled today.

## 2013-11-05 NOTE — Assessment & Plan Note (Addendum)
Mostly left isded affected by position changes. Moist heat, gentle stretching, Sal[place] Pas, xray unremarkable. Report worsening symptoms

## 2013-11-05 NOTE — Assessment & Plan Note (Signed)
Given refill on Ambien

## 2014-02-26 ENCOUNTER — Encounter: Payer: Self-pay | Admitting: Family Medicine

## 2014-02-26 ENCOUNTER — Ambulatory Visit (INDEPENDENT_AMBULATORY_CARE_PROVIDER_SITE_OTHER): Payer: BC Managed Care – PPO | Admitting: Family Medicine

## 2014-02-26 VITALS — BP 104/62 | HR 69 | Temp 97.8°F | Ht 67.5 in | Wt 175.0 lb

## 2014-02-26 DIAGNOSIS — F418 Other specified anxiety disorders: Secondary | ICD-10-CM

## 2014-02-26 DIAGNOSIS — R030 Elevated blood-pressure reading, without diagnosis of hypertension: Secondary | ICD-10-CM

## 2014-02-26 DIAGNOSIS — F411 Generalized anxiety disorder: Secondary | ICD-10-CM

## 2014-02-26 DIAGNOSIS — IMO0001 Reserved for inherently not codable concepts without codable children: Secondary | ICD-10-CM

## 2014-02-26 DIAGNOSIS — Z0000 Encounter for general adult medical examination without abnormal findings: Secondary | ICD-10-CM

## 2014-02-26 DIAGNOSIS — F341 Dysthymic disorder: Secondary | ICD-10-CM

## 2014-02-26 DIAGNOSIS — J019 Acute sinusitis, unspecified: Secondary | ICD-10-CM

## 2014-02-26 MED ORDER — ALPRAZOLAM 0.25 MG PO TABS: 0.2500 mg | ORAL_TABLET | Freq: Two times a day (BID) | ORAL | Status: DC | PRN

## 2014-02-26 MED ORDER — ESCITALOPRAM OXALATE 20 MG PO TABS: 20.0000 mg | ORAL_TABLET | Freq: Every day | ORAL | Status: DC

## 2014-02-26 NOTE — Progress Notes (Signed)
Pre visit review using our clinic review tool, if applicable. No additional management support is needed unless otherwise documented below in the visit note. 

## 2014-02-26 NOTE — Progress Notes (Signed)
Patient ID: Samantha Morales, female   DOB: 12/14/1971, 43 y.o.   MRN: [id number] TAVIONNA EZZO [id number] Sep 02, 1971 02/26/2014      Progress Note-Follow Up  Subjective  Chief Complaint  Chief Complaint  Patient presents with  . Annual Exam    Physical    HPI  Patient is a 43 year old Caucasian female who is in today for annual exam. She is just recovering from a sinusitis. She feels greatly improved but still has had congestion and some postnasal drip. Has a mild cough but no fevers or chills. Her malaise and myalgias have improved. No chest pain, palpitations or shortness of breath. Has her mammogram scheduled for this afternoon. No GI or GU complaints. Continues to struggle with anxiety and depression but no suicidal ideation. Has not responded to citalopram  Past Medical History  Diagnosis Date  . RESTLESS LEG SYNDROME, HX OF 01/20/2011  . LACERATION OF FINGER 01/27/2011  . GERD 01/20/2011  . DEPRESSION 01/20/2011  . CHICKENPOX, HX OF 01/20/2011  . ANEMIA, HX OF 01/20/2011  . Tobacco abuse 05/12/2011  . Cellulitis of ankle 05/12/2011  . Atypical chest pain 04/15/2012  . Elevated BP 04/15/2012  . Sacroiliac joint pain 07/22/2012  . Sinusitis, acute 12/12/2012  . Insomnia 12/12/2012  . Back pain 07/22/2012    Past Surgical History  Procedure Laterality Date  . Lasik eye surgery x b/l      Family History  Problem Relation Age of Onset  . Hypertension Mother   . Lupus Sister   . Hypertension Brother   . Cancer Maternal Grandmother     breast  . Alzheimer's disease Maternal Grandmother   . Aneurysm Maternal Grandfather     brain  . Arthritis Maternal Grandfather     rheumatoid  . Nephrolithiasis Paternal Grandmother   . Heart attack Paternal Grandfather   . Anemia Brother     History   Social History  . Marital Status: Married    Spouse Name: N/A    Number of Children: N/A  . Years of Education: N/A   Occupational History  . Not on file.   Social History Main  Topics  . Smoking status: Former Smoker -- 0.50 packs/day    Quit date: 04/12/2012  . Smokeless tobacco: Never Used  . Alcohol Use: 0.0 oz/week    0 drink(s) per week     Comment: 6-7 a week  . Drug Use: No  . Sexual Activity: Yes    Partners: Male   Other Topics Concern  . Not on file   Social History Narrative  . No narrative on file    Current Outpatient Prescriptions on File Prior to Visit  Medication Sig Dispense Refill  . Cholecalciferol (EQL VITAMIN D3) 1000 UNITS tablet Take 1,000 Units by mouth daily.        . Ferrous Fumarate-Folic Acid 99991111 MG TABS Take 1 tablet by mouth daily.  30 each  2  . ibuprofen (ADVIL,MOTRIN) 200 MG tablet Take 200 mg by mouth every 6 (six) hours as needed.      Marland Kitchen omeprazole (PRILOSEC) 20 MG capsule TAKE 1 CAPSULE (20 MG TOTAL) BY MOUTH DAILY.  30 capsule  4  . zolpidem (AMBIEN) 10 MG tablet Take 1 tablet (10 mg total) by mouth at bedtime as needed for sleep.  30 tablet  2   No current facility-administered medications on file prior to visit.    Allergies  Allergen Reactions  . Erythromycin     REACTION: rash  .  Penicillins     REACTION: swelling, rash  . Sulfonamide Derivatives     REACTION: GI upset    Review of Systems  Review of Systems  Constitutional: Negative for fever and malaise/fatigue.  HENT: Negative for congestion.   Eyes: Negative for discharge.  Respiratory: Negative for shortness of breath.   Cardiovascular: Negative for chest pain, palpitations and leg swelling.  Gastrointestinal: Negative for nausea, abdominal pain and diarrhea.  Genitourinary: Negative for dysuria.  Musculoskeletal: Negative for falls.  Skin: Negative for rash.  Neurological: Negative for loss of consciousness and headaches.  Endo/Heme/Allergies: Negative for polydipsia.  Psychiatric/Behavioral: Negative for depression and suicidal ideas. The patient is nervous/anxious and has insomnia.     Objective  BP 104/62  Pulse 69  Temp(Src)  97.8 F (36.6 C) (Oral)  Ht 5' 7.5" (1.715 m)  Wt 175 lb 0.6 oz (79.398 kg)  BMI 26.99 kg/m2  SpO2 99%  LMP 02/19/2014  Physical Exam  Physical Exam  Constitutional: She is oriented to person, place, and time and well-developed, well-nourished, and in no distress. No distress.  HENT:  Head: Normocephalic and atraumatic.  Right Ear: External ear normal.  Left Ear: External ear normal.  Nose: Nose normal.  Mouth/Throat: Oropharynx is clear and moist. No oropharyngeal exudate.  Eyes: Conjunctivae are normal. Pupils are equal, round, and reactive to light. Right eye exhibits no discharge. Left eye exhibits no discharge. No scleral icterus.  Neck: Normal range of motion. Neck supple. No thyromegaly present.  Cardiovascular: Normal rate, regular rhythm, normal heart sounds and intact distal pulses.   No murmur heard. Pulmonary/Chest: Effort normal and breath sounds normal. No respiratory distress. She has no wheezes. She has no rales.  Abdominal: Soft. Bowel sounds are normal. She exhibits no distension and no mass. There is no tenderness.  Musculoskeletal: Normal range of motion. She exhibits no edema and no tenderness.  Lymphadenopathy:    She has no cervical adenopathy.  Neurological: She is alert and oriented to person, place, and time. She has normal reflexes. No cranial nerve deficit. Coordination normal.  Skin: Skin is warm and dry. No rash noted. She is not diaphoretic.  Psychiatric: Mood, memory and affect normal.    Lab Results  Component Value Date   TSH 1.00 12/12/2012   Lab Results  Component Value Date   WBC 7.2 12/12/2012   HGB 10.6* 12/12/2012   HCT 31.3* 12/12/2012   MCV 90.8 12/12/2012   PLT 194.0 12/12/2012   Lab Results  Component Value Date   CREATININE 0.8 04/15/2012   BUN 15 04/15/2012   NA 138 04/15/2012   K 5.0 04/15/2012   CL 104 04/15/2012   CO2 27 04/15/2012   Lab Results  Component Value Date   ALT 15 04/15/2012   AST 22 04/15/2012   ALKPHOS 50  04/15/2012   BILITOT 0.6 04/15/2012   Lab Results  Component Value Date   CHOL 174 04/15/2012   Lab Results  Component Value Date   HDL 98.70 04/15/2012   Lab Results  Component Value Date   LDLCALC 65 04/15/2012   Lab Results  Component Value Date   TRIG 54.0 04/15/2012   Lab Results  Component Value Date   CHOLHDL 2 04/15/2012     Assessment & Plan  Elevated BP Well controlled today  Depression with anxiety Did not respond to Celexa will try Lexapro 10 mg daily.   Sinusitis, acute Improving, encouraged increase fluids, increase rest, add Mucinex, probiotics and zinc, call if  worsens again.  Preventative health care Encouraged heart healthy diet, regular exercise, adequate sleep and annual labs. MGM today

## 2014-02-26 NOTE — Patient Instructions (Signed)
[facility] covered?  Insomnia Insomnia is frequent trouble falling and/or staying asleep. Insomnia can be a long term problem or a short term problem. Both are common. Insomnia can be a short term problem when the wakefulness is related to a certain stress or worry. Long term insomnia is often related to ongoing stress during waking hours and/or poor sleeping habits. Overtime, sleep deprivation itself can make the problem worse. Every little thing feels more severe because you are overtired and your ability to cope is decreased. CAUSES   Stress, anxiety, and depression.  Poor sleeping habits.  Distractions such as TV in the bedroom.  Naps close to bedtime.  Engaging in emotionally charged conversations before bed.  Technical reading before sleep.  Alcohol and other sedatives. They may make the problem worse. They can hurt normal sleep patterns and normal dream activity.  Stimulants such as caffeine for several hours prior to bedtime.  Pain syndromes and shortness of breath can cause insomnia.  Exercise late at night.  Changing time zones may cause sleeping problems (jet lag). It is sometimes helpful to have someone observe your sleeping patterns. They should look for periods of not breathing during the night (sleep apnea). They should also look to see how long those periods last. If you live alone or observers are uncertain, you can also be observed at a sleep clinic where your sleep patterns will be professionally monitored. Sleep apnea requires a checkup and treatment. Give your caregivers your medical history. Give your caregivers observations your family has made about your sleep.  SYMPTOMS   Not feeling rested in the morning.  Anxiety and restlessness at bedtime.  Difficulty falling and staying asleep. TREATMENT   Your caregiver may prescribe treatment for an underlying medical disorders. Your caregiver can give advice or help if you are using alcohol or other drugs  for self-medication. Treatment of underlying problems will usually eliminate insomnia problems.  Medications can be prescribed for short time use. They are generally not recommended for lengthy use.  Over-the-counter sleep medicines are not recommended for lengthy use. They can be habit forming.  You can promote easier sleeping by making lifestyle changes such as:  Using relaxation techniques that help with breathing and reduce muscle tension.  Exercising earlier in the day.  Changing your diet and the time of your last meal. No night time snacks.  Establish a regular time to go to bed.  Counseling can help with stressful problems and worry.  Soothing music and white noise may be helpful if there are background noises you cannot remove.  Stop tedious detailed work at least one hour before bedtime. HOME CARE INSTRUCTIONS   Keep a diary. Inform your caregiver about your progress. This includes any medication side effects. See your caregiver regularly. Take note of:  Times when you are asleep.  Times when you are awake during the night.  The quality of your sleep.  How you feel the next day. This information will help your caregiver care for you.  Get out of bed if you are still awake after 15 minutes. Read or do some quiet activity. Keep the lights down. Wait until you feel sleepy and go back to bed.  Keep regular sleeping and waking hours. Avoid naps.  Exercise regularly.  Avoid distractions at bedtime. Distractions include watching television or engaging in any intense or detailed activity like attempting to balance the household checkbook.  Develop a bedtime ritual. Keep a familiar routine of bathing, brushing your teeth, climbing into  bed at the same time each night, listening to soothing music. Routines increase the success of falling to sleep faster.  Use relaxation techniques. This can be using breathing and muscle tension release routines. It can also include  visualizing peaceful scenes. You can also help control troubling or intruding thoughts by keeping your mind occupied with boring or repetitive thoughts like the old concept of counting sheep. You can make it more creative like imagining planting one beautiful flower after another in your backyard garden.  During your day, work to eliminate stress. When this is not possible use some of the previous suggestions to help reduce the anxiety that accompanies stressful situations. MAKE SURE YOU:   Understand these instructions.  Will watch your condition.  Will get help right away if you are not doing well or get worse. Document Released: 12/11/2000 Document Revised: 03/07/2012 Document Reviewed: 01/11/2008 [facility] Patient Information 2014 [place].

## 2014-02-28 ENCOUNTER — Encounter: Payer: Self-pay | Admitting: Family Medicine

## 2014-02-28 DIAGNOSIS — Z0000 Encounter for general adult medical examination without abnormal findings: Secondary | ICD-10-CM | POA: Insufficient documentation

## 2014-02-28 HISTORY — DX: Encounter for general adult medical examination without abnormal findings: Z00.00

## 2014-02-28 NOTE — Assessment & Plan Note (Signed)
Improving, encouraged increase fluids, increase rest, add Mucinex, probiotics and zinc, call if worsens again.

## 2014-02-28 NOTE — Assessment & Plan Note (Addendum)
Encouraged heart healthy diet, regular exercise, adequate sleep and annual labs. MGM today

## 2014-02-28 NOTE — Assessment & Plan Note (Signed)
Did not respond to Celexa will try Lexapro 10 mg daily.

## 2014-02-28 NOTE — Assessment & Plan Note (Signed)
Well controlled today.

## 2014-03-27 ENCOUNTER — Telehealth: Payer: Self-pay | Admitting: Family Medicine

## 2014-03-27 MED ORDER — OMEPRAZOLE 20 MG PO CPDR: 20.0000 mg | DELAYED_RELEASE_CAPSULE | Freq: Every day | ORAL | Status: DC

## 2014-03-27 NOTE — Telephone Encounter (Signed)
Refill omeprazole

## 2014-03-27 NOTE — Telephone Encounter (Signed)
rx sent

## 2014-05-11 ENCOUNTER — Other Ambulatory Visit: Payer: Self-pay | Admitting: Family Medicine

## 2014-05-28 ENCOUNTER — Encounter: Payer: Self-pay | Admitting: Family Medicine

## 2014-05-28 ENCOUNTER — Ambulatory Visit (INDEPENDENT_AMBULATORY_CARE_PROVIDER_SITE_OTHER): Payer: BC Managed Care – PPO | Admitting: Family Medicine

## 2014-05-28 VITALS — BP 100/64 | HR 70 | Temp 98.3°F | Ht 67.5 in | Wt 173.0 lb

## 2014-05-28 DIAGNOSIS — G4700 Insomnia, unspecified: Secondary | ICD-10-CM

## 2014-05-28 DIAGNOSIS — F411 Generalized anxiety disorder: Secondary | ICD-10-CM

## 2014-05-28 DIAGNOSIS — R030 Elevated blood-pressure reading, without diagnosis of hypertension: Secondary | ICD-10-CM

## 2014-05-28 DIAGNOSIS — Z862 Personal history of diseases of the blood and blood-forming organs and certain disorders involving the immune mechanism: Secondary | ICD-10-CM

## 2014-05-28 DIAGNOSIS — K219 Gastro-esophageal reflux disease without esophagitis: Secondary | ICD-10-CM

## 2014-05-28 DIAGNOSIS — IMO0001 Reserved for inherently not codable concepts without codable children: Secondary | ICD-10-CM

## 2014-05-28 MED ORDER — ESCITALOPRAM OXALATE 20 MG PO TABS: 20.0000 mg | ORAL_TABLET | Freq: Every day | ORAL | Status: DC

## 2014-05-28 MED ORDER — ALPRAZOLAM 0.25 MG PO TABS: 0.2500 mg | ORAL_TABLET | Freq: Two times a day (BID) | ORAL | Status: DC | PRN

## 2014-05-28 MED ORDER — TEMAZEPAM 15 MG PO CAPS: 15.0000 mg | ORAL_CAPSULE | Freq: Every evening | ORAL | Status: DC | PRN

## 2014-05-28 NOTE — Progress Notes (Signed)
Pre visit review using our clinic review tool, if applicable. No additional management support is needed unless otherwise documented below in the visit note. 

## 2014-05-28 NOTE — Patient Instructions (Signed)

## 2014-06-03 ENCOUNTER — Encounter: Payer: Self-pay | Admitting: Family Medicine

## 2014-06-03 NOTE — Progress Notes (Signed)
Patient ID: Samantha Morales, female   DOB: [birth year], 43 y.o.   MRN: [id number] Samantha Morales [id number] Samantha Morales 06/03/2014      Progress Note-Follow Up  Subjective  Chief Complaint  Chief Complaint  Patient presents with  . Follow-up    3 month    HPI  Patient is a 43 year old female in today for routine medical care. No recent illness. No new concerns but does continue to struggle with poor sleep. And in did not help her fall asleep or stay asleep well. Alprazolam does help her fall asleep but she wakes up quickly. She has tried good sleep hygiene with dark, cool room etc without affect.  Past Medical History  Diagnosis Date  . RESTLESS LEG SYNDROME, HX OF 01/20/2011  . LACERATION OF FINGER 01/27/2011  . GERD 01/20/2011  . DEPRESSION 01/20/2011  . CHICKENPOX, HX OF 01/20/2011  . ANEMIA, HX OF 01/20/2011  . Tobacco abuse 05/12/2011  . Cellulitis of ankle 05/12/2011  . Atypical chest pain 04/15/2012  . Elevated BP 04/15/2012  . Sacroiliac joint pain 07/22/2012  . Sinusitis, acute 12/12/2012  . Insomnia 12/12/2012  . Back pain 07/22/2012  . Preventative health care 02/28/2014    Past Surgical History  Procedure Laterality Date  . Lasik eye surgery x b/l      Family History  Problem Relation Age of Onset  . Lupus Sister   . Hypertension Brother   . Cancer Maternal Grandmother     breast  . Alzheimer's disease Maternal Grandmother   . Aneurysm Maternal Grandfather     brain  . Arthritis Maternal Grandfather     rheumatoid  . Nephrolithiasis Paternal Grandmother   . Heart attack Paternal Grandfather   . Anemia Brother   . Hypertension Father     History   Social History  . Marital Status: Married    Spouse Name: N/A    Number of Children: N/A  . Years of Education: N/A   Occupational History  . Not on file.   Social History Main Topics  . Smoking status: Former Smoker -- 0.50 packs/day    Quit date: 04/12/2012  . Smokeless tobacco: Never Used  . Alcohol  Use: 0.0 oz/week    0 drink(s) per week     Comment: 6-7 a week  . Drug Use: No  . Sexual Activity: Yes    Partners: Male     Comment: no dietary, lives with husband, wears seatbelt   Other Topics Concern  . Not on file   Social History Narrative  . No narrative on file    Current Outpatient Prescriptions on File Prior to Visit  Medication Sig Dispense Refill  . Cholecalciferol (EQL VITAMIN D3) 1000 UNITS tablet Take 1,000 Units by mouth daily.        . Ferrous Fumarate-Folic Acid 644-0 MG TABS Take 1 tablet by mouth daily.  30 each  2  . ibuprofen (ADVIL,MOTRIN) 200 MG tablet Take 200 mg by mouth every 6 (six) hours as needed.      Marland Kitchen NASONEX 50 MCG/ACT nasal spray       . omeprazole (PRILOSEC) 20 MG capsule Take 1 capsule (20 mg total) by mouth daily.  30 capsule  4   No current facility-administered medications on file prior to visit.    Allergies  Allergen Reactions  . Erythromycin     REACTION: rash  . Penicillins     REACTION: swelling, rash  . Sulfonamide Derivatives  REACTION: GI upset    Review of Systems  Review of Systems  Constitutional: Negative for fever and malaise/fatigue.  HENT: Negative for congestion.   Eyes: Negative for discharge.  Respiratory: Negative for shortness of breath.   Cardiovascular: Negative for chest pain, palpitations and leg swelling.  Gastrointestinal: Negative for nausea, abdominal pain and diarrhea.  Genitourinary: Negative for dysuria.  Musculoskeletal: Negative for falls.  Skin: Negative for rash.  Neurological: Negative for loss of consciousness and headaches.  Endo/Heme/Allergies: Negative for polydipsia.  Psychiatric/Behavioral: Negative for depression and suicidal ideas. The patient is not nervous/anxious and does not have insomnia.     Objective  BP 100/64  Pulse 70  Temp(Src) 98.3 F (36.8 C) (Oral)  Ht 5' 7.5" (1.715 m)  Wt 173 lb (78.472 kg)  BMI 26.68 kg/m2  SpO2 97%  LMP 04/27/2014  Physical  Exam  Physical Exam  Constitutional: She is oriented to person, place, and time and well-developed, well-nourished, and in no distress. No distress.  HENT:  Head: Normocephalic and atraumatic.  Eyes: Conjunctivae are normal.  Neck: Neck supple. No thyromegaly present.  Cardiovascular: Normal rate, regular rhythm and normal heart sounds.   No murmur heard. Pulmonary/Chest: Effort normal and breath sounds normal. She has no wheezes.  Abdominal: She exhibits no distension and no mass.  Musculoskeletal: She exhibits no edema.  Lymphadenopathy:    She has no cervical adenopathy.  Neurological: She is alert and oriented to person, place, and time.  Skin: Skin is warm and dry. No rash noted. She is not diaphoretic.  Psychiatric: Memory, affect and judgment normal.    Lab Results  Component Value Date   TSH 1.00 12/12/2012   Lab Results  Component Value Date   WBC 7.2 12/12/2012   HGB 10.6* 12/12/2012   HCT 31.3* 12/12/2012   MCV 90.8 12/12/2012   PLT 194.0 12/12/2012   Lab Results  Component Value Date   CREATININE 0.8 04/15/2012   BUN 15 04/15/2012   NA 138 04/15/2012   K 5.0 04/15/2012   CL 104 04/15/2012   CO2 27 04/15/2012   Lab Results  Component Value Date   ALT 15 04/15/2012   AST 22 04/15/2012   ALKPHOS 50 04/15/2012   BILITOT 0.6 04/15/2012   Lab Results  Component Value Date   CHOL 174 04/15/2012   Lab Results  Component Value Date   HDL 98.70 04/15/2012   Lab Results  Component Value Date   LDLCALC 65 04/15/2012   Lab Results  Component Value Date   TRIG 54.0 04/15/2012   Lab Results  Component Value Date   CHOLHDL 2 04/15/2012     Assessment & Plan  GERD Avoid offending foods, start probiotics. Do not eat large meals in late evening and consider raising head of bed.   Elevated BP Well controlled, no changes to meds. Encouraged heart healthy diet such as the DASH diet and exercise as tolerated.   Insomnia Try Restoril 15 mg, Ambien did not help keep  her asleep. Encouraged good sleep hygiene such as dark, quiet room. No blue/green glowing lights such as computer screens in bedroom. No alcohol or stimulants in evening. Cut down on caffeine as able. Regular exercise is helpful but not just prior to bed time.   ANEMIA, HX OF Increase leafy greens, consider increased lean red meat and using cast iron cookware. Continue to monitor, report any concerns

## 2014-06-03 NOTE — Assessment & Plan Note (Signed)
Avoid offending foods, start probiotics. Do not eat large meals in late evening and consider raising head of bed.  

## 2014-06-03 NOTE — Assessment & Plan Note (Signed)
Well controlled, no changes to meds. Encouraged heart healthy diet such as the DASH diet and exercise as tolerated.  °

## 2014-06-03 NOTE — Assessment & Plan Note (Signed)
Try Restoril 15 mg, Ambien did not help keep her asleep. Encouraged good sleep hygiene such as dark, quiet room. No blue/green glowing lights such as computer screens in bedroom. No alcohol or stimulants in evening. Cut down on caffeine as able. Regular exercise is helpful but not just prior to bed time.

## 2014-06-03 NOTE — Assessment & Plan Note (Signed)
Increase leafy greens, consider increased lean red meat and using cast iron cookware. Continue to monitor, report any concerns 

## 2014-08-21 ENCOUNTER — Ambulatory Visit: Payer: BC Managed Care – PPO | Admitting: Family

## 2014-08-21 ENCOUNTER — Other Ambulatory Visit: Payer: Self-pay | Admitting: Family Medicine

## 2014-08-28 ENCOUNTER — Telehealth: Payer: Self-pay

## 2014-08-28 ENCOUNTER — Ambulatory Visit (INDEPENDENT_AMBULATORY_CARE_PROVIDER_SITE_OTHER): Payer: BC Managed Care – PPO | Admitting: Family Medicine

## 2014-08-28 ENCOUNTER — Encounter: Payer: Self-pay | Admitting: Family Medicine

## 2014-08-28 VITALS — BP 98/72 | HR 63 | Temp 97.9°F | Ht 67.5 in | Wt 173.4 lb

## 2014-08-28 DIAGNOSIS — IMO0002 Reserved for concepts with insufficient information to code with codable children: Secondary | ICD-10-CM

## 2014-08-28 DIAGNOSIS — K219 Gastro-esophageal reflux disease without esophagitis: Secondary | ICD-10-CM

## 2014-08-28 DIAGNOSIS — Z23 Encounter for immunization: Secondary | ICD-10-CM

## 2014-08-28 DIAGNOSIS — F17201 Nicotine dependence, unspecified, in remission: Secondary | ICD-10-CM

## 2014-08-28 DIAGNOSIS — Z87891 Personal history of nicotine dependence: Secondary | ICD-10-CM

## 2014-08-28 DIAGNOSIS — L0100 Impetigo, unspecified: Secondary | ICD-10-CM

## 2014-08-28 DIAGNOSIS — E559 Vitamin D deficiency, unspecified: Secondary | ICD-10-CM

## 2014-08-28 DIAGNOSIS — G4700 Insomnia, unspecified: Secondary | ICD-10-CM

## 2014-08-28 DIAGNOSIS — IMO0001 Reserved for inherently not codable concepts without codable children: Secondary | ICD-10-CM

## 2014-08-28 DIAGNOSIS — R030 Elevated blood-pressure reading, without diagnosis of hypertension: Secondary | ICD-10-CM

## 2014-08-28 DIAGNOSIS — M7710 Lateral epicondylitis, unspecified elbow: Secondary | ICD-10-CM

## 2014-08-28 DIAGNOSIS — D649 Anemia, unspecified: Secondary | ICD-10-CM

## 2014-08-28 MED ORDER — MUPIROCIN 2 % EX OINT: 1.0000 "application " | TOPICAL_OINTMENT | Freq: Every day | CUTANEOUS | Status: DC

## 2014-08-28 MED ORDER — ZOLPIDEM TARTRATE 10 MG PO TABS: 10.0000 mg | ORAL_TABLET | Freq: Every evening | ORAL | Status: DC | PRN

## 2014-08-28 NOTE — Telephone Encounter (Signed)
Pt brought in her RX of Temazepam. 29 Temazepam dispensed. Counted and verified with Dannielle Karvonen, RN

## 2014-08-28 NOTE — Progress Notes (Signed)
Pre visit review using our clinic review tool, if applicable. No additional management support is needed unless otherwise documented below in the visit note. 

## 2014-08-28 NOTE — Patient Instructions (Signed)
Insomnia Insomnia is frequent trouble falling and/or staying asleep. Insomnia can be a long term problem or a short term problem. Both are common. Insomnia can be a short term problem when the wakefulness is related to a certain stress or worry. Long term insomnia is often related to ongoing stress during waking hours and/or poor sleeping habits. Overtime, sleep deprivation itself can make the problem worse. Every little thing feels more severe because you are overtired and your ability to cope is decreased. CAUSES   Stress, anxiety, and depression.  Poor sleeping habits.  Distractions such as TV in the bedroom.  Naps close to bedtime.  Engaging in emotionally charged conversations before bed.  Technical reading before sleep.  Alcohol and other sedatives. They may make the problem worse. They can hurt normal sleep patterns and normal dream activity.  Stimulants such as caffeine for several hours prior to bedtime.  Pain syndromes and shortness of breath can cause insomnia.  Exercise late at night.  Changing time zones may cause sleeping problems (jet lag). It is sometimes helpful to have someone observe your sleeping patterns. They should look for periods of not breathing during the night (sleep apnea). They should also look to see how long those periods last. If you live alone or observers are uncertain, you can also be observed at a sleep clinic where your sleep patterns will be professionally monitored. Sleep apnea requires a checkup and treatment. Give your caregivers your medical history. Give your caregivers observations your family has made about your sleep.  SYMPTOMS   Not feeling rested in the morning.  Anxiety and restlessness at bedtime.  Difficulty falling and staying asleep. TREATMENT   Your caregiver may prescribe treatment for an underlying medical disorders. Your caregiver can give advice or help if you are using alcohol or other drugs for self-medication. Treatment  of underlying problems will usually eliminate insomnia problems.  Medications can be prescribed for short time use. They are generally not recommended for lengthy use.  Over-the-counter sleep medicines are not recommended for lengthy use. They can be habit forming.  You can promote easier sleeping by making lifestyle changes such as:  Using relaxation techniques that help with breathing and reduce muscle tension.  Exercising earlier in the day.  Changing your diet and the time of your last meal. No night time snacks.  Establish a regular time to go to bed.  Counseling can help with stressful problems and worry.  Soothing music and white noise may be helpful if there are background noises you cannot remove.  Stop tedious detailed work at least one hour before bedtime. HOME CARE INSTRUCTIONS   Keep a diary. Inform your caregiver about your progress. This includes any medication side effects. See your caregiver regularly. Take note of:  Times when you are asleep.  Times when you are awake during the night.  The quality of your sleep.  How you feel the next day. This information will help your caregiver care for you.  Get out of bed if you are still awake after 15 minutes. Read or do some quiet activity. Keep the lights down. Wait until you feel sleepy and go back to bed.  Keep regular sleeping and waking hours. Avoid naps.  Exercise regularly.  Avoid distractions at bedtime. Distractions include watching television or engaging in any intense or detailed activity like attempting to balance the household checkbook.  Develop a bedtime ritual. Keep a familiar routine of bathing, brushing your teeth, climbing into bed at the same   time each night, listening to soothing music. Routines increase the success of falling to sleep faster.  Use relaxation techniques. This can be using breathing and muscle tension release routines. It can also include visualizing peaceful scenes. You can  also help control troubling or intruding thoughts by keeping your mind occupied with boring or repetitive thoughts like the old concept of counting sheep. You can make it more creative like imagining planting one beautiful flower after another in your backyard garden.  During your day, work to eliminate stress. When this is not possible use some of the previous suggestions to help reduce the anxiety that accompanies stressful situations. MAKE SURE YOU:   Understand these instructions.  Will watch your condition.  Will get help right away if you are not doing well or get worse. Document Released: 12/11/2000 Document Revised: 03/07/2012 Document Reviewed: 01/11/2008 Exit[employer] Patient Information 2015 [employer]. This information is not intended to replace advice given to you by your health care provider. Make sure you discuss any questions you have with your health care provider.  

## 2014-08-29 LAB — RENAL FUNCTION PANEL
Albumin: 3.9 g/dL (ref 3.5–5.2)
BUN: 10 mg/dL (ref 6–23)
CO2: 27 mEq/L (ref 19–32)
Calcium: 9 mg/dL (ref 8.4–10.5)
Chloride: 103 mEq/L (ref 96–112)
Creatinine, Ser: 0.8 mg/dL (ref 0.4–1.2)
GFR: 85.59 mL/min (ref >60.00)
Glucose, Bld: 68 mg/dL — ABNORMAL LOW (ref 70–99)
Phosphorus: 2.7 mg/dL (ref 2.3–4.6)
Potassium: 4.5 mEq/L (ref 3.5–5.1)
Sodium: 137 mEq/L (ref 135–145)

## 2014-08-29 LAB — LIPID PANEL
Cholesterol: 160 mg/dL (ref 0–200)
HDL: 93.2 mg/dL (ref >39.00)
LDL Cholesterol: 59 mg/dL (ref 0–99)
NonHDL: 66.8
Total CHOL/HDL Ratio: 2
Triglycerides: 37 mg/dL (ref 0.0–149.0)
VLDL: 7.4 mg/dL (ref 0.0–40.0)

## 2014-08-29 LAB — HEPATIC FUNCTION PANEL
ALT: 21 U/L (ref 0–35)
AST: 28 U/L (ref 0–37)
Albumin: 3.9 g/dL (ref 3.5–5.2)
Alkaline Phosphatase: 65 U/L (ref 39–117)
Bilirubin, Direct: 0.1 mg/dL (ref 0.0–0.3)
Total Bilirubin: 0.9 mg/dL (ref 0.2–1.2)
Total Protein: 7.2 g/dL (ref 6.0–8.3)

## 2014-08-29 LAB — CBC
HCT: 34.6 % — ABNORMAL LOW (ref 36.0–46.0)
Hemoglobin: 11.2 g/dL — ABNORMAL LOW (ref 12.0–15.0)
MCHC: 32.4 g/dL (ref 30.0–36.0)
MCV: 91.7 fl (ref 78.0–100.0)
Platelets: 251 10*3/uL (ref 150.0–400.0)
RBC: 3.78 Mil/uL — ABNORMAL LOW (ref 3.87–5.11)
RDW: 14.9 % (ref 11.5–15.5)
WBC: 4.9 10*3/uL (ref 4.0–10.5)

## 2014-08-29 LAB — TSH: TSH: 2.07 u[IU]/mL (ref 0.35–4.50)

## 2014-08-29 LAB — VITAMIN D 25 HYDROXY (VIT D DEFICIENCY, FRACTURES): VITD: 46.12 ng/mL (ref 30.00–100.00)

## 2014-09-03 DIAGNOSIS — L0100 Impetigo, unspecified: Secondary | ICD-10-CM | POA: Insufficient documentation

## 2014-09-03 DIAGNOSIS — IMO0002 Reserved for concepts with insufficient information to code with codable children: Secondary | ICD-10-CM | POA: Insufficient documentation

## 2014-09-03 DIAGNOSIS — E559 Vitamin D deficiency, unspecified: Secondary | ICD-10-CM | POA: Insufficient documentation

## 2014-09-03 NOTE — Assessment & Plan Note (Signed)
Well controlled. Encouraged heart healthy diet such as the DASH diet and exercise as tolerated.  

## 2014-09-03 NOTE — Assessment & Plan Note (Signed)
Failed Temazepam, did not work. Brought back upused tabs which were counted and wasted at office.

## 2014-09-03 NOTE — Assessment & Plan Note (Signed)
Right elbow, encouraged ice and topical treatments and referred to sports med for further consideration

## 2014-09-03 NOTE — Assessment & Plan Note (Signed)
Good response to Doxycycline and Clinda gel but lesions still persistent will try Mupirocin ointment and call if worsens

## 2014-09-03 NOTE — Assessment & Plan Note (Signed)
Doing well since June offered ongoing encouragement

## 2014-09-03 NOTE — Progress Notes (Signed)
Patient ID: Samantha Morales, female   DOB: [birth year], 43 y.o.   MRN: [id number] Samantha Morales [id number] Aug 06, 1971 09/03/2014      Progress Note-Follow Up  Subjective  Chief Complaint  Chief Complaint  Patient presents with  . Follow-up    3 month  . Injections    flu    HPI  Patient is a 43 year old female in today for routine medical care. In today for followup. Generally doing well. Has not had any cigarettes since June of 2015 and reports her breathing is better. She has been placed on doxycycline and clindamycin gel by her dermatologist with good results. She had a significant staph infection which is greatly improved but she does have some persistent mild irritation. She is complaining of right elbow pain off and on for about 3 weeks. Has had trouble with this in the past. 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 Denies CP/palp/SOB/HA/congestion/fevers/GI or GU c/o. Taking meds as prescribed  Past Medical History  Diagnosis Date  . RESTLESS LEG SYNDROME, HX OF 01/20/2011  . LACERATION OF FINGER 01/27/2011  . GERD 01/20/2011  . DEPRESSION 01/20/2011  . CHICKENPOX, HX OF 01/20/2011  . ANEMIA, HX OF 01/20/2011  . Tobacco abuse 05/12/2011  . Cellulitis of ankle 05/12/2011  . Atypical chest pain 04/15/2012  . Elevated BP 04/15/2012  . Sacroiliac joint pain 07/22/2012  . Sinusitis, acute 12/12/2012  . Insomnia 12/12/2012  . Back pain 07/22/2012  . Preventative health care 02/28/2014  . Tobacco abuse, in remission 05/12/2011    Past Surgical History  Procedure Laterality Date  . Lasik eye surgery x b/l      Family History  Problem Relation Age of Onset  . Lupus Sister   . Hypertension Brother   . Cancer Maternal Grandmother     breast  . Alzheimer's disease Maternal Grandmother   . Aneurysm Maternal Grandfather     brain  . Arthritis Maternal Grandfather     rheumatoid  . Nephrolithiasis Paternal Grandmother   . Heart attack Paternal Grandfather   .  Anemia Brother   . Hypertension Father     History   Social History  . Marital Status: Married    Spouse Name: N/A    Number of Children: N/A  . Years of Education: N/A   Occupational History  . Not on file.   Social History Main Topics  . Smoking status: Former Smoker -- 0.50 packs/day    Quit date: 04/12/2012  . Smokeless tobacco: Never Used  . Alcohol Use: 0.0 oz/week    0 drink(s) per week     Comment: 6-7 a week  . Drug Use: No  . Sexual Activity: Yes    Partners: Male     Comment: no dietary, lives with husband, wears seatbelt   Other Topics Concern  . Not on file   Social History Narrative  . No narrative on file    Current Outpatient Prescriptions on File Prior to Visit  Medication Sig Dispense Refill  . ALPRAZolam (XANAX) 0.25 MG tablet Take 1 tablet (0.25 mg total) by mouth 2 (two) times daily as needed for anxiety or sleep.  30 tablet  2  . Cholecalciferol (EQL VITAMIN D3) 1000 UNITS tablet Take 1,000 Units by mouth daily.        Marland Kitchen escitalopram (LEXAPRO) 20 MG tablet Take 1 tablet (20 mg total) by mouth daily.  30 tablet  5  . Ferrous Fumarate-Folic Acid  324-1 MG TABS Take 1 tablet by mouth daily.  30 each  2  . ibuprofen (ADVIL,MOTRIN) 200 MG tablet Take 200 mg by mouth every 6 (six) hours as needed.      Marland Kitchen omeprazole (PRILOSEC) 20 MG capsule TAKE 1 CAPSULE (20 MG TOTAL) BY MOUTH DAILY.  30 capsule  4   No current facility-administered medications on file prior to visit.    Allergies  Allergen Reactions  . Erythromycin     REACTION: rash  . Penicillins     REACTION: swelling, rash  . Sulfonamide Derivatives     REACTION: GI upset    Review of Systems  Review of Systems  Constitutional: Negative for fever and malaise/fatigue.  HENT: Negative for congestion.   Eyes: Negative for discharge.  Respiratory: Negative for shortness of breath.   Cardiovascular: Negative for chest pain, palpitations and leg swelling.  Gastrointestinal: Negative for  nausea, abdominal pain and diarrhea.  Genitourinary: Negative for dysuria.  Musculoskeletal: Positive for joint pain. Negative for falls.       Right elbow pain  Skin: Negative for rash.  Neurological: Negative for loss of consciousness and headaches.  Endo/Heme/Allergies: Negative for polydipsia.  Psychiatric/Behavioral: Negative for depression and suicidal ideas. The patient has insomnia. The patient is not nervous/anxious.        Did not respond to Temazepam will return to previous meds which worked better    Objective  BP 98/72  Pulse 63  Temp(Src) 97.9 F (36.6 C) (Oral)  Ht 5' 7.5" (1.715 m)  Wt 173 lb 6.4 oz (78.654 kg)  BMI 26.74 kg/m2  SpO2 97%  LMP 08/22/2014  Physical Exam  Physical Exam  Constitutional: She is oriented to person, place, and time and well-developed, well-nourished, and in no distress. No distress.  HENT:  Head: Normocephalic and atraumatic.  Eyes: Conjunctivae are normal.  Neck: Neck supple. No thyromegaly present.  Cardiovascular: Normal rate, regular rhythm and normal heart sounds.   No murmur heard. Pulmonary/Chest: Effort normal and breath sounds normal. She has no wheezes.  Abdominal: She exhibits no distension and no mass.  Musculoskeletal: She exhibits no edema.  Lymphadenopathy:    She has no cervical adenopathy.  Neurological: She is alert and oriented to person, place, and time.  Skin: Skin is warm and dry. No rash noted. She is not diaphoretic.  Psychiatric: Memory, affect and judgment normal.    Lab Results  Component Value Date   TSH 2.07 08/28/2014   Lab Results  Component Value Date   WBC 4.9 08/28/2014   HGB 11.2* 08/28/2014   HCT 34.6* 08/28/2014   MCV 91.7 08/28/2014   PLT 251.0 08/28/2014   Lab Results  Component Value Date   CREATININE 0.8 08/28/2014   BUN 10 08/28/2014   NA 137 08/28/2014   K 4.5 08/28/2014   CL 103 08/28/2014   CO2 27 08/28/2014   Lab Results  Component Value Date   ALT 21 08/28/2014   AST 28 08/28/2014    ALKPHOS 65 08/28/2014   BILITOT 0.9 08/28/2014   Lab Results  Component Value Date   CHOL 160 08/28/2014   Lab Results  Component Value Date   HDL 93.20 08/28/2014   Lab Results  Component Value Date   LDLCALC 59 08/28/2014   Lab Results  Component Value Date   TRIG 37.0 08/28/2014   Lab Results  Component Value Date   CHOLHDL 2 08/28/2014     Assessment & Plan  GERD Avoid offending foods,  start probiotics. Do not eat large meals in late evening and consider raising head of bed.   Elevated BP Well controlled. Encouraged heart healthy diet such as the DASH diet and exercise as tolerated.   Tobacco abuse, in remission Doing well since June offered ongoing encouragement  Insomnia Failed Temazepam, did not work. Brought back upused tabs which were counted and wasted at office.   Epicondylitis Right elbow, encouraged ice and topical treatments and referred to sports med for further consideration  Impetigo Good response to Doxycycline and Clinda gel but lesions still persistent will try Mupirocin ointment and call if worsens

## 2014-09-03 NOTE — Assessment & Plan Note (Signed)
Avoid offending foods, start probiotics. Do not eat large meals in late evening and consider raising head of bed.  

## 2014-09-14 ENCOUNTER — Encounter: Payer: Self-pay | Admitting: Family Medicine

## 2014-09-14 ENCOUNTER — Ambulatory Visit (INDEPENDENT_AMBULATORY_CARE_PROVIDER_SITE_OTHER)
Admission: RE | Admit: 2014-09-14 | Discharge: 2014-09-14 | Disposition: A | Discharge status: Home / Self Care | Payer: BC Managed Care – PPO | Source: Ambulatory Visit | Attending: Family Medicine | Admitting: Family Medicine

## 2014-09-14 ENCOUNTER — Other Ambulatory Visit (INDEPENDENT_AMBULATORY_CARE_PROVIDER_SITE_OTHER): Payer: BC Managed Care – PPO

## 2014-09-14 ENCOUNTER — Ambulatory Visit (INDEPENDENT_AMBULATORY_CARE_PROVIDER_SITE_OTHER): Payer: BC Managed Care – PPO | Admitting: Family Medicine

## 2014-09-14 VITALS — BP 118/68 | HR 78 | Ht 67.5 in | Wt 177.0 lb

## 2014-09-14 DIAGNOSIS — M25529 Pain in unspecified elbow: Secondary | ICD-10-CM

## 2014-09-14 DIAGNOSIS — M7711 Lateral epicondylitis, right elbow: Secondary | ICD-10-CM

## 2014-09-14 DIAGNOSIS — M7710 Lateral epicondylitis, unspecified elbow: Secondary | ICD-10-CM

## 2014-09-14 DIAGNOSIS — M25521 Pain in right elbow: Secondary | ICD-10-CM

## 2014-09-14 MED ORDER — MELOXICAM 15 MG PO TABS: 15.0000 mg | ORAL_TABLET | Freq: Every day | ORAL | Status: DC

## 2014-09-14 NOTE — Assessment & Plan Note (Addendum)
Lateral Epicondylitis: Elbow anatomy was reviewed, and tendinopathy was explained.  Pt. given a formal rehab program. Series of concentric and eccentric exercises should be done starting with no weight, work up to 1 lb, hammer, etc.  Wrist brace given.  Discussed icing.   Formal PT would be beneficial. Emphasized stretching an cross-friction massage Emphasized proper palms up lifting biomechanics to unload ECRB Come back in 4 weeks.

## 2014-09-14 NOTE — Progress Notes (Signed)
  [facility] [place],  [id number] Phone: [phone] Subjective:    I'm seeing this patient by the request  of:  Penni Homans, MD   CC: Right elbow pain.   [facility]:ENIDPOEUMP Samantha Morales is a 43 y.o. female coming in with complaint of right elbow pain. Patient states that he has had this pain for multiple years but seems to be getting worse. Patient states it is mostly on the lateral aspect of the elbow. 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 Patient states it is more of a dull aching pain that can have sharp from time to time. Patient states it hurts with flexion as well as extension as well as when she moves her fingers. Denies any numbness or weakness. Denies any associated neck pain.     Past medical history, social, surgical and family history all reviewed in electronic medical record.   Review of Systems: No headache, visual changes, nausea, vomiting, diarrhea, constipation, dizziness, abdominal pain, skin rash, fevers, chills, night sweats, weight loss, swollen lymph nodes, body aches, joint swelling, muscle aches, chest pain, shortness of breath, mood changes.   Objective Blood pressure 118/68, pulse 78, height 5' 7.5" (1.715 m), weight 177 lb (80.287 kg), last menstrual period 08/22/2014, SpO2 98.00%.  General: No apparent distress alert and oriented x3 mood and affect normal, dressed appropriately.  HEENT: Pupils equal, extraocular movements intact  Respiratory: Patient's speak in full sentences and does not appear short of breath  Cardiovascular: No lower extremity edema, non tender, no erythema  Skin: Warm dry intact with no signs of infection or rash on extremities or on axial skeleton.  Abdomen: Soft nontender  Neuro: Cranial nerves II through XII are intact, neurovascularly intact in all extremities with 2+ DTRs and 2+ pulses.  Lymph: No lymphadenopathy of posterior or anterior cervical chain or axillae  bilaterally.  Gait normal with good balance and coordination.  MSK:  Non tender with full range of motion and good stability and symmetric strength and tone of shoulders,  wrist, hip, knee and ankles bilaterally.  Elbow: Right Unremarkable to inspection. Range of motion full pronation, supination, flexion, extension. Strength is full to all of the above directions Stable to varus, valgus stress. Negative moving valgus stress test. Mild tenderness over the lateral epicondylar region as well as the joint space itself. Ulnar nerve does not sublux. Negative cubital tunnel Tinel's.  Musculoskeletal ultrasound was performed and interpreted by Charlann Boxer D.O.   Elbow:  Lateral epicondyle and common extensor tendon has significant hypoechoic changes but no true tear appreciated. Patient does have what appears to be a very small avulsion fracture noted. Radial head unremarkable and located in annular ligament Medial epicondyle and common flexor tendon origin visualized.  No edema, effusions, or avulsions seen. Ulnar nerve in cubital tunnel unremarkable. Olecranon and triceps insertion visualized and unremarkable without edema, effusion, or avulsion.  No signs olecranon bursitis. Power doppler signal normal.  IMPRESSION:  Lateral epicondylitis with questionable avulsion fracture    Impression and Recommendations:     This case required medical decision making of moderate complexity.

## 2014-09-14 NOTE — Patient Instructions (Signed)
Very nice to meet you Ice 20 minutes 2 times daily. Usually after activity and before bed. Exercises 3 times a week.  Wear brace day and night for 2 weeks then at night for 2 weeks.  Vitamin D 2000 IU daily Xrays downstairs today Come back in 3-4 weeks to make sure you are doing.

## 2014-10-02 ENCOUNTER — Encounter: Payer: Self-pay | Admitting: Family Medicine

## 2014-10-02 ENCOUNTER — Ambulatory Visit (INDEPENDENT_AMBULATORY_CARE_PROVIDER_SITE_OTHER): Payer: BC Managed Care – PPO | Admitting: Family Medicine

## 2014-10-02 ENCOUNTER — Other Ambulatory Visit (INDEPENDENT_AMBULATORY_CARE_PROVIDER_SITE_OTHER): Payer: BC Managed Care – PPO

## 2014-10-02 VITALS — BP 128/82 | HR 68 | Ht 67.0 in | Wt 175.0 lb

## 2014-10-02 DIAGNOSIS — M7711 Lateral epicondylitis, right elbow: Secondary | ICD-10-CM

## 2014-10-02 DIAGNOSIS — G5631 Lesion of radial nerve, right upper limb: Secondary | ICD-10-CM

## 2014-10-02 DIAGNOSIS — G5630 Lesion of radial nerve, unspecified upper limb: Secondary | ICD-10-CM | POA: Insufficient documentation

## 2014-10-02 NOTE — Progress Notes (Signed)
[facility] [place], [place] [address] Phone: [phone] Subjective:    I'm seeing this patient by the request  of:  Penni Homans, MD   CC: Right elbow pain.   Samantha Morales is a 43 y.o. female coming in with complaint of right elbow pain. Patient was seen previously and was diagnosed with a lateral epicondylitis with a questionable concern for posterior interosseous nerve impingement. Patient was placed in a wrist brace, we discussed oral anti-inflammatories, icing protocol and home exercises. Patient states that she is approximately 10% better. Patient states that now she is having more pain in her hand. 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 Patient states sometimes she can have a numbness in her hand as well. Not waking her up at night.     Past medical history, social, surgical and family history all reviewed in electronic medical record.   Review of Systems: No headache, visual changes, nausea, vomiting, diarrhea, constipation, dizziness, abdominal pain, skin rash, fevers, chills, night sweats, weight loss, swollen lymph nodes, body aches, joint swelling, muscle aches, chest pain, shortness of breath, mood changes.   Objective Blood pressure 128/82, pulse 68, height 5\' 7"  (1.702 m), weight 175 lb (79.379 kg), last menstrual period 09/14/2014, SpO2 98.00%.  General: No apparent distress alert and oriented x3 mood and affect normal, dressed appropriately.  HEENT: Pupils equal, extraocular movements intact  Respiratory: Patient's speak in full sentences and does not appear short of breath  Cardiovascular: No lower extremity edema, non tender, no erythema  Skin: Warm dry intact with no signs of infection or rash on extremities or on axial skeleton.  Abdomen: Soft nontender  Neuro: Cranial nerves II through XII are intact, neurovascularly intact in all extremities with 2+ DTRs  and 2+ pulses.  Lymph: No lymphadenopathy of posterior or anterior cervical chain or axillae bilaterally.  Gait normal with good balance and coordination.  MSK:  Non tender with full range of motion and good stability and symmetric strength and tone of shoulders,  wrist, hip, knee and ankles bilaterally.  Elbow: Right Unremarkable to inspection. Range of motion full pronation, supination, flexion, extension. Strength is full to all of the above directions Stable to varus, valgus stress. Negative moving valgus stress test. Mild tenderness over the lateral epicondylar region as well as the joint space itself. Patient though does have more pain actually over the posterior interosseous nerve today. Ulnar nerve does not sublux. Negative cubital tunnel Tinel's.  Musculoskeletal ultrasound was performed and interpreted by Charlann Boxer D.O.   Elbow:  Lateral epicondyle and common extensor tendon has significant hypoechoic changes but no true tear appreciated. Avulsion fracture appears to have healed. Patient's tenderness is more over the posterior interosseous nerve Radial head unremarkable and located in annular ligament Medial epicondyle and common flexor tendon origin visualized.  No edema, effusions, or avulsions seen. Ulnar nerve in cubital tunnel unremarkable. Olecranon and triceps insertion visualized and unremarkable without edema, effusion, or avulsion.  No signs olecranon bursitis. Power doppler signal normal.  IMPRESSION:  Lateral epicondylitis with posterior interosseous nerve entrapment Procedure: Real-time Ultrasound Guided Injection of the posterior interosseous nerve sheath Device: GE Logiq E  Ultrasound guided injection is preferred based studies that show increased duration, increased effect, greater accuracy, decreased procedural pain, increased response rate, and decreased cost with ultrasound guided versus blind injection.  Verbal informed consent obtained.  Time-out conducted.  Noted no overlying erythema, induration, or other signs of local infection.  Skin prepped in a sterile fashion.  Local anesthesia: Topical Ethyl chloride.  With sterile technique and under real time ultrasound guidance:  With a 25-gauge 1 inch needle was injected under ultrasound guidance within the neural sheath of the posterior interosseous nerve. Completed without difficulty  Pain immediately resolved suggesting accurate placement of the medication.  Advised to call if fevers/chills, erythema, induration, drainage, or persistent bleeding.  Images permanently stored and available for review in the ultrasound unit.  Impression: Technically successful ultrasound guided injection.   Impression and Recommendations:     This case required medical decision making of moderate complexity.

## 2014-10-02 NOTE — Patient Instructions (Signed)
Good to see you Continue the meloxicam as you need it.  Exercises 3 times a week.  Ice is your friend Brace still day and night for 1 week then  Nightly  EMG is ordered and they will call you.  Come back 1-2 days after EMG and we will go over results and make sure your better.

## 2014-10-02 NOTE — Assessment & Plan Note (Signed)
Patient was given an injection today and tolerated the procedure relatively well with good resolution of pain. We discussed postinjection instructions. We discussed continuing icing protocol as well as the bracing. I do believe the patient having radicular symptoms I do want to rule out any other nerve impingement and I think an EMG would be helpful. We have is ordered. Patient is to come back after the EMG to make sure she responded very well to this injection and to go over the results of the EMG.  Spent greater than 25 minutes with patient face-to-face and had greater than 50% of counseling including as described above in assessment and plan.

## 2014-10-03 ENCOUNTER — Other Ambulatory Visit: Payer: Self-pay | Admitting: *Deleted

## 2014-10-03 DIAGNOSIS — M7711 Lateral epicondylitis, right elbow: Secondary | ICD-10-CM

## 2014-10-17 ENCOUNTER — Encounter: Payer: Self-pay | Admitting: Family Medicine

## 2014-10-17 ENCOUNTER — Ambulatory Visit (INDEPENDENT_AMBULATORY_CARE_PROVIDER_SITE_OTHER): Payer: BC Managed Care – PPO | Admitting: Family Medicine

## 2014-10-17 ENCOUNTER — Other Ambulatory Visit (INDEPENDENT_AMBULATORY_CARE_PROVIDER_SITE_OTHER): Payer: BC Managed Care – PPO

## 2014-10-17 VITALS — BP 116/82 | HR 66 | Ht 67.5 in | Wt 173.0 lb

## 2014-10-17 DIAGNOSIS — M79671 Pain in right foot: Secondary | ICD-10-CM

## 2014-10-17 DIAGNOSIS — M7751 Other enthesopathy of right foot: Secondary | ICD-10-CM

## 2014-10-17 DIAGNOSIS — M778 Other enthesopathies, not elsewhere classified: Secondary | ICD-10-CM

## 2014-10-17 DIAGNOSIS — M779 Enthesopathy, unspecified: Secondary | ICD-10-CM

## 2014-10-17 NOTE — Progress Notes (Signed)
  [facility] [place], [place] [address] Phone: [phone] Subjective:     Samantha Morales, Samantha Morales Phone: [phone] Subjective:      CC: First toe pain  [id number] Samantha Morales is a 43 y.o. female coming in with complaint of right-sided first toe pain. Patient states that this started approximately 2 weeks ago. Was a dull aching sensation that has gotten worse. It hurts more when she is angulated. Describes it as a dull throbbing sensation. Localized mostly over the first MTP joint. Denies any swelling or numbness. States at night she is having cramping though of the feet bilaterally. Denies any weakness. Not stopping her from any activity and patient has even gone to the gym at one time. Patient has tried changing shoes with minimal benefit. Patient has not tried any other modalities.     Past medical history, social, surgical and family history all reviewed in electronic medical record.   Review of Systems: No headache, visual changes, nausea, vomiting, diarrhea, constipation, dizziness, abdominal pain, skin rash, fevers, chills, night sweats, weight loss, swollen lymph nodes, body aches, joint swelling, muscle aches, chest pain, shortness of breath, mood changes.   Objective Blood pressure 116/82, pulse 66, height 5' 7.5" (1.715 m), weight 173 lb (78.472 kg), SpO2 99.00%.  General: No apparent distress alert and oriented x3 mood and affect normal, dressed appropriately.  HEENT: Pupils equal, extraocular movements intact  Respiratory: Patient's speak in full sentences and does not appear short of breath  Cardiovascular: No lower extremity edema, non tender, no erythema  Skin: Warm dry intact with no signs of infection or rash on extremities or on axial skeleton.  Abdomen: Soft nontender  Neuro: Cranial nerves II through XII are intact, neurovascularly intact in all  extremities with 2+ DTRs and 2+ pulses.  Lymph: No lymphadenopathy of posterior or anterior cervical chain or axillae bilaterally.  Gait normal with good balance and coordination.  MSK:  Non tender with full range of motion and good stability and symmetric strength and tone of shoulders,  wrist, hip, knee and ankles bilaterally.  Foot exam shows the patient does have is cavus bilaterally. Patient also has mild hallux limitus of the right toe compared to full range of motion on the left toe first toe. Neurovascularly intact distally. Full range of motion of the ankle with full strength.  Limited musculoskeletal ultrasound was performed and interpreted by Hulan Saas, M  Limited ultrasound shows the patient does have mild hypoechoic changes and increased size of the capsule over the first MTP. No significant break down of the bone or any cortical defect noted. Mild increased outflow in the area. Impression: Capsulitis   Impression and Recommendations:     This case required medical decision making of moderate complexity.   ';

## 2014-10-17 NOTE — Assessment & Plan Note (Signed)
Patient is having capsulitis of the first toe. In addition to this I think patient is having breakdown of the longitudinal arch of her foot. We discussed proper shoe wear, icing protocol, oral anti-inflammatories, over-the-counter medications as well as with patient's history of anemia will increase her iron encase that is why she is having spasms at night. Patient given exercises and then will followup again in 3-4 weeks for further evaluation.  Spent greater than 25 minutes with patient face-to-face and had greater than 50% of counseling including as described above in assessment and plan.

## 2014-10-17 NOTE — Patient Instructions (Signed)
Good to see you as always.  Ice bath 20 minutes nightly Pennsaid daily  Increase iron to twice daily for 1 week Turmeric 500mg  twice daily. Meloxicam daily for 10 days then as needed.  Its not the gym! Rigid sole shoes and avoid being barefoot Come back in 3 weeks.

## 2014-10-23 ENCOUNTER — Ambulatory Visit: Payer: BC Managed Care – PPO | Admitting: Family Medicine

## 2014-11-11 ENCOUNTER — Encounter: Payer: Self-pay | Admitting: Family Medicine

## 2014-11-21 ENCOUNTER — Encounter: Payer: Self-pay | Admitting: Neurology

## 2014-11-21 ENCOUNTER — Ambulatory Visit (INDEPENDENT_AMBULATORY_CARE_PROVIDER_SITE_OTHER): Payer: BC Managed Care – PPO | Admitting: Neurology

## 2014-11-21 DIAGNOSIS — M7711 Lateral epicondylitis, right elbow: Secondary | ICD-10-CM

## 2014-11-21 DIAGNOSIS — M5412 Radiculopathy, cervical region: Secondary | ICD-10-CM

## 2014-11-21 NOTE — Procedures (Signed)
[facility]  [address]  [place], [place] [address] Tel: [phone] Fax:  [phone] Test Date:  11/21/2014  Patient: Samantha Morales DOB: [birth year] Physician: Narda Amber  Sex: Female Height: 5\' 7"  Ref Phys: Hulan Saas  ID#: [id number] Temp: 36.5C Technician:    Patient Complaints: This is a 43 year-old female presenting for evaluation of right elbow pain and paresthesias, concerning for posterior interosseous nerve entrapment.  NCV & EMG Findings: Electrodiagnostic testing of the right upper extremity shows:  1. Normal median, ulnar, radial, and palmar sensory responses.  2. Normal median, ulnar, and radial motor responses.  3. Mild chronic motor axon loss changes are seen affecting C8 myotomes on the right, without accompanied active denervation.     Impression: 1. Chronic C8 radiculopathy affecting the right upper extremity, very mild in degree electrically. 2. There is no evidence of carpal tunnel syndrome, ulnar neuropathy, or a radial neuropathy affecting the right upper extremity.   ___________________________ Narda Amber    Nerve Conduction Studies Anti Sensory Summary Table   Stim Site NR Peak (ms) Norm Peak (ms) P-T Amp (V) Norm P-T Amp  Right Median Anti Sensory (2nd Digit)  Wrist    2.8 <3.4 37.4 >20  Right Radial Anti Sensory (Base 1st Digit)  Wrist    2.1 <2.7 30.6 >18  Right Ulnar Anti Sensory (5th Digit)  Wrist    2.5 <3.1 45.8 >12   Motor Summary Table   Stim Site NR Onset (ms) Norm Onset (ms) O-P Amp (mV) Norm O-P Amp Site1 Site2 Delta-0 (ms) Dist (cm) Vel (m/s) Norm Vel (m/s)  Right Median Motor (Abd Poll Brev)  Wrist    3.0 <3.9 9.3 >6 Elbow Wrist 4.6 27.0 59 >50  Elbow    7.6  8.9         Left Radial Motor (Ext Ind Prop)  7cm    1.1 <3.1 6.4 >6        Right Radial Motor (Ext Ind Prop)  7cm    2.0 <3.1 6.7 >6 Elbow 7cm 2.2 14.0 64 >50  Elbow    4.2  5.4  Below spiral groove Elbow 0.2 7.0 350   Below spiral  groove    4.4  5.2         Above spiral groove    6.0  5.1         Right Ulnar Motor (Abd Dig Minimi)  Wrist    1.8 <3.1 8.7 >7 B Elbow Wrist 3.7 24.0 65 >50  B Elbow    5.5  8.3  A Elbow B Elbow 1.9 10.0 53 >50  A Elbow    7.4  7.2          Comparison Summary Table   Stim Site NR Peak (ms) Norm Peak (ms) P-T Amp (V) Site1 Site2 Delta-P (ms) Norm Delta (ms)  Right Median/Ulnar Palm Comparison (Wrist - 8cm)  Median Palm    1.7 <2.2 91.6 Median Palm Ulnar Palm 0.2   Ulnar Palm    1.5 <2.2 54.9       EMG   Side Muscle Ins Act Fibs Psw Fasc Number Recrt Dur Dur. Amp Amp. Poly Poly. Comment  Right 1stDorInt Nml Nml Nml Nml 1- Mod-R Few 1+ Nml Nml Nml Nml N/A  Right Ext Indicis Nml Nml Nml Nml 1- Mod-R Few 1+ Nml Nml Nml Nml N/A  Right BrachioRad Nml Nml Nml Nml Nml Nml Nml Nml Nml Nml Nml Nml N/A  Right Ext Digitorum Nml Nml Nml Nml Nml Nml Nml Nml Nml Nml Nml Nml N/A  Right PronatorTeres Nml Nml Nml Nml Nml Nml Nml Nml Nml Nml Nml Nml N/A  Right FlexPolLong Nml Nml Nml Nml 1- Mod-R Few 1+ Nml Nml Nml Nml N/A  Right Biceps Nml Nml Nml Nml Nml Nml Nml Nml Nml Nml Nml Nml N/A  Right Triceps Nml Nml Nml Nml 1- Mod-R Few 1+ Nml Nml Nml Nml N/A  Right Deltoid Nml Nml Nml Nml Nml Nml Nml Nml Nml Nml Nml Nml N/A      Waveforms:

## 2014-11-26 ENCOUNTER — Ambulatory Visit (INDEPENDENT_AMBULATORY_CARE_PROVIDER_SITE_OTHER): Payer: BC Managed Care – PPO | Admitting: Family Medicine

## 2014-11-26 ENCOUNTER — Encounter: Payer: Self-pay | Admitting: Family Medicine

## 2014-11-26 ENCOUNTER — Ambulatory Visit (INDEPENDENT_AMBULATORY_CARE_PROVIDER_SITE_OTHER)
Admission: RE | Admit: 2014-11-26 | Discharge: 2014-11-26 | Disposition: A | Discharge status: Home / Self Care | Payer: BC Managed Care – PPO | Source: Ambulatory Visit | Attending: Family Medicine | Admitting: Family Medicine

## 2014-11-26 VITALS — BP 122/78 | HR 69 | Ht 67.0 in | Wt 176.0 lb

## 2014-11-26 DIAGNOSIS — M778 Other enthesopathies, not elsewhere classified: Secondary | ICD-10-CM

## 2014-11-26 DIAGNOSIS — M9902 Segmental and somatic dysfunction of thoracic region: Secondary | ICD-10-CM

## 2014-11-26 DIAGNOSIS — M999 Biomechanical lesion, unspecified: Secondary | ICD-10-CM | POA: Insufficient documentation

## 2014-11-26 DIAGNOSIS — M9908 Segmental and somatic dysfunction of rib cage: Secondary | ICD-10-CM

## 2014-11-26 DIAGNOSIS — M5010 Cervical disc disorder with radiculopathy, unspecified cervical region: Secondary | ICD-10-CM

## 2014-11-26 DIAGNOSIS — M779 Enthesopathy, unspecified: Secondary | ICD-10-CM

## 2014-11-26 DIAGNOSIS — M542 Cervicalgia: Secondary | ICD-10-CM

## 2014-11-26 DIAGNOSIS — M7751 Other enthesopathy of right foot: Secondary | ICD-10-CM

## 2014-11-26 DIAGNOSIS — M9901 Segmental and somatic dysfunction of cervical region: Secondary | ICD-10-CM

## 2014-11-26 NOTE — Assessment & Plan Note (Signed)
Decision today to treat with OMT was based on Physical Exam  After verbal consent patient was treated with HVLA, ME techniques in cervical, thoracic, rib areas  Patient tolerated the procedure well with improvement in symptoms  Patient given exercises, stretches and lifestyle modifications  See medications in patient instructions if given  Patient will follow up in 3 weeks        

## 2014-11-26 NOTE — Assessment & Plan Note (Signed)
Resolved

## 2014-11-26 NOTE — Progress Notes (Signed)
    [facility] [address] [place], [place] [address] Phone: [phone] Subjective:      CC: Neck pain, follow-up toe pain  [id number] Samantha Morales is a 43 y.o. female coming in with complaint of right-sided first toe pain. Patient was seen previously and no fracture was noted. Patient though did have a capsulitis of the first toe. Patient elected to try conservative therapy including anti-inflammatories, icing protocol, and changing shoes. Patient states her toe pain is completely resolved.  Patient is also had lateral epicondylitis previously as well as the posterior interosseous nerve syndrome. There is a possibility that this was more radicular symptoms. Patient did have nerve conduction study done. These results were reviewed by me. Patient does have some mild C8 radicular symptoms. Patient states that she still has a dull aching pain but now seems been more localized around her neck. Patient has seen a chiropractor for this previously. Patient states she does not like to go though because it is painful and expensive. Patient denies any weakness in the upper extremity at this time. Resting fairly comfortably at night.       Past medical history, social, surgical and family history all reviewed in electronic medical record.   Review of Systems: No headache, visual changes, nausea, vomiting, diarrhea, constipation, dizziness, abdominal pain, skin rash, fevers, chills, night sweats, weight loss, swollen lymph nodes, body aches, joint swelling, muscle aches, chest pain, shortness of breath, mood changes.   Objective Blood pressure 122/78, pulse 69, height 5\' 7"  (1.702 m), weight 176 lb (79.833 kg), last menstrual period 11/12/2014, SpO2 99 %.  General: No apparent distress alert and oriented x3 mood and affect normal, dressed appropriately.  HEENT: Pupils equal, extraocular movements intact  Respiratory: Patient's speak in full sentences and does  not appear short of breath  Cardiovascular: No lower extremity edema, non tender, no erythema  Skin: Warm dry intact with no signs of infection or rash on extremities or on axial skeleton.  Abdomen: Soft nontender  Neuro: Cranial nerves II through XII are intact, neurovascularly intact in all extremities with 2+ DTRs and 2+ pulses.  Lymph: No lymphadenopathy of posterior or anterior cervical chain or axillae bilaterally.  Gait normal with good balance and coordination.  MSK:  Non tender with full range of motion and good stability and symmetric strength and tone of shoulders,  wrist, hip, knee and ankles bilaterally.  Foot exam shows the patient does have is cavus bilaterally. Patient also has mild hallux limitus but nontender on exam today. Neck: Inspection unremarkable. No palpable stepoffs. Negative Spurling's maneuver. Full neck range of motion Grip strength and sensation normal in bilateral hands Strength good C4 to T1 distribution No sensory change to C4 to T1 Negative Hoffman sign bilaterally Reflexes normal  OMT Physical Exam  Standing flexion right  Seated Flexion right  Cervical  C2 flexed rotated and side bent left C7 flexed rotated and side bent right  Thoracic T1 extended rotated and side bent right with elevated first rib  Lumbar L2 flexed rotated and side bent left  Sacrum Right on right  Illium Neutral       Impression and Recommendations:     This case required medical decision making of moderate complexity.   ';

## 2014-11-26 NOTE — Patient Instructions (Addendum)
Good to see you Alvera Singh is your friend.  Gabapentin 100mg  at night for first week.  Then 200mg  at night at night.  Get xray today New exercises 3 times a week On wall heels, butt shoulder and head touching for goal of 5 minutes daily.  See me again in 3 weeks.   Scapular Winging  with Rehab  Scapular winging syndrome is also known as serratus anterior palsy or long thoracic nerve injury. The condition is an uncommon injury to the nervous system. The condition is caused by injury to the long thoracic nerve that runs through the neck and shoulder. Injury to the shoulder, such as a fall or repetitive stress on the shoulder causes the nerve to become stretched. Occasionally the injury is the result of an infection of the nerve. Damage to the long thoracic nerve results in weakness of the serratus anterior muscle. The serratus anterior muscle is responsible for controlling the shoulder blade (scapula). Weakness in this muscle results in a instability (winging) of the scapula. SYMPTOMS   Pain and weakness in the shoulder (usually the back of the shoulder) that is often diffuse or unable to localize.  Loss of or decrease in shoulder function.  Upper back pain while sitting, due to the scapula pressing on the back of the chair.  Visible deformity in the back of the shoulder. CAUSES  Scapular winging is caused by stretching of the long thoracic nerve. Common mechanisms of injury include:  Viral illness.  Repetitive and/or stressful use of the shoulder.  Falling onto the shoulder with the head and neck stretched away from the shoulder. RISK INCREASES WITH:  Contact sports (football, rugby, lacrosse, or soccer).  Activities involving overhead arm movement (baseball, volleyball, or racquet sports).  Poor strength and flexibility. PREVENTION  Warm up and stretch properly before activity.  Allow for adequate recovery between workouts.  Maintain physical fitness:  Strength, flexibility, and  endurance.  Cardiovascular fitness.  Learn and use proper technique. When possible, have a coach correct improper technique. PROGNOSIS  Scapular winging normally resolves spontaneously within 18 months. In rare circumstances surgery is recommended.  RELATED COMPLICATIONS   Permanent nerve damage, including pain, numbness, tingle, or weakness.  Shoulder weakness.  Recurrent shoulder pain.  Inability to compete in athletics. TREATMENT Treatment initially involves resting from any activities that aggravate your symptoms. The use of ice and medication may help reduce pain and inflammation. The use of strengthening and stretching exercises may help reduce pain with activity, specifically shoulder exercises that improve range of motion. These exercises may be performed at home or with referral to a therapist. If symptoms persist for greater than 6 months despite non-surgical (conservative) treatment, then surgery may be recommended. Surgery is only used for the most serious cases and the purpose is to regain function, not to allow an athlete to return to sports. MEDICATION   If pain medication is necessary, then nonsteroidal anti-inflammatory medications, such as aspirin and ibuprofen, or other minor pain relievers, such as acetaminophen, are often recommended.  Do not take pain medication for 7 days before surgery.  Prescription pain relievers may be given if deemed necessary by your caregiver. Use only as directed and only as much as you need. HEAT AND COLD  Cold treatment (icing) relieves pain and reduces inflammation. Cold treatment should be applied for 10 to 15 minutes every 2 to 3 hours for inflammation and pain and immediately after any activity that aggravates your symptoms. Use ice packs or massage the area with  a piece of ice (ice massage).  Heat treatment may be used prior to performing the stretching and strengthening activities prescribed by your caregiver, physical therapist, or  athletic trainer. Use a heat pack or soak the injury in warm water. SEEK MEDICAL CARE IF:  Treatment seems to offer no benefit, or the condition worsens.  Any medications produce adverse side effects. EXERCISES  RANGE OF MOTION (ROM) AND STRETCHING EXERCISES - Scapular Winging (Serratus Anterior Palsy, Long Thoracic Nerve Injury)  These exercises may help you when beginning to rehabilitate your injury. Your symptoms may resolve with or without further involvement from your physician, physical therapist or athletic trainer. While completing these exercises, remember:   Restoring tissue flexibility helps normal motion to return to the joints. This allows healthier, less painful movement and activity.  An effective stretch should be held for at least 30 seconds.  A stretch should never be painful. You should only feel a gentle lengthening or release in the stretched tissue. ROM - Pendulum  Bend at the waist so that your right / left arm falls away from your body. Support yourself with your opposite hand on a solid surface, such as a table or a countertop.  Your right / left arm should be perpendicular to the ground. If it is not perpendicular, you need to lean over farther. Relax the muscles in your right / left arm and shoulder as much as possible.  Gently sway your hips and trunk so they move your right / left arm without any use of your right / left shoulder muscles.  Progress your movements so that your right / left arm moves side to side, then forward and backward, and finally, both clockwise and counterclockwise.  Complete __________ repetitions in each direction. Many people use this exercise to relieve discomfort in their shoulder as well as to gain range of motion. Repeat __________ times. Complete this exercise __________ times per day. STRETCH - Flexion, Seated   Sit in a firm chair so that your right / left forearm can rest on a table or on a table or countertop. Your right /  left elbow should rest below the height of your shoulder so that your shoulder feels supported and not tense or uncomfortable.  Keeping your right / left shoulder relaxed, lean forward at your waist, allowing your right / left hand to slide forward. Bend forward until you feel a moderate stretch in your shoulder, but before you feel an increase in your pain.  Hold __________ seconds. Slowly return to your starting position. Repeat __________ times. Complete this exercise __________ times per day.  STRETCH - Flexion, Standing  Stand with good posture. With an underhand grip on your right / left and an overhand grip on the opposite hand, grasp a broomstick or cane so that your hands are a little more than shoulder-width apart.  Keeping your right / left elbow straight and shoulder muscles relaxed, push the stick with your opposite hand to raise your right / left arm in front of your body and then overhead. Raise your arm until you feel a stretch in your right / left shoulder, but before you have increased shoulder pain.  Avoid shrugging your right / left shoulder as your arm rises by keeping your shoulder blade tucked down and toward your mid-back spine. Hold __________ seconds.  Slowly return to the starting position. Repeat __________ times. Complete this exercise __________ times per day. STRETCH - Abduction, Supine  Stand with good posture. With an underhand  grip on your right / left and an overhand grip on the opposite hand, grasp a broomstick or cane so that your hands are a little more than shoulder-width apart.  Keeping your right / left elbow straight and shoulder muscles relaxed, push the stick with your opposite hand to raise your right / left arm out to the side of your body and then overhead. Raise your arm until you feel a stretch in your right / left shoulder, but before you have increased shoulder pain.  Avoid shrugging your right / left shoulder as your arm rises by keeping your  shoulder blade tucked down and toward your mid-back spine. Hold __________ seconds.  Slowly return to the starting position. Repeat __________ times. Complete this exercise __________ times per day. ROM - Flexion, Active-Assisted  Lie on your back. You may bend your knees for comfort.  Grasp a broomstick or cane so your hands are about shoulder-width apart. Your right / left hand should grip the end of the stick/cane so that your hand is positioned "thumbs-up," as if you were about to shake hands.  Using your healthy arm to lead, raise your right / left arm overhead until you feel a gentle stretch in your shoulder. Hold __________ seconds.  Use the stick/cane to assist in returning your right / left arm to its starting position. Repeat __________ times. Complete this exercise __________ times per day.  STRENGTHENING EXERCISES - Scapular Winging (Serratus Anterior Palsy, Long Thoracic Nerve Injury) These exercises may help you when beginning to rehabilitate your injury. They may resolve your symptoms with or without further involvement from your physician, physical therapist or athletic trainer. While completing these exercises, remember:   Muscles can gain both the endurance and the strength needed for everyday activities through controlled exercises.  Complete these exercises as instructed by your physician, physical therapist or athletic trainer. Progress with the resistance and repetition exercises only as your caregiver advises.  You may experience muscle soreness or fatigue, but the pain or discomfort you are trying to eliminate should never worsen during these exercises. If this pain does worsen, stop and make certain you are following the directions exactly. If the pain is still present after adjustments, discontinue the exercise until you can discuss the trouble with your clinician.  During your recovery, avoid activity or exercises which involve actions that place your injured hand or  elbow above your head or behind your back or head. These positions stress the tissues which are trying to heal. STRENGTH - Scapular Depression and Adduction   With good posture, sit on a firm chair. Supported your arms in front of you with pillows, arm rests or a table top. Have your elbows in line with the sides of your body.  Gently draw your shoulder blades down and toward your mid-back spine. Gradually increase the tension without tensing the muscles along the top of your shoulders and the back of your neck.  Hold for __________ seconds. Slowly release the tension and relax your muscles completely before completing the next repetition.  After you have practiced this exercise, remove the arm support and complete it in standing as well as sitting. Repeat __________ times. Complete this exercise __________ times per day.  STRENGTH - Scapular Protractors, Standing   Stand arms-length away from a wall. Place your hands on the wall, keeping your elbows straight.  Begin by dropping your shoulder blades down and toward your mid-back spine.  To strengthen your protractors, keep your shoulder blades down, but  slide them forward on your rib cage. It will feel as if you are lifting the back of your rib cage away from the wall. This is a subtle motion and can be challenging to complete. Ask your clinician for further instruction if you are not sure you are doing the exercise correctly.  Hold for __________ seconds. Slowly return to the starting position, resting the muscles completely before completing the next repetition. Repeat __________ times. Complete this exercise __________ times per day. STRENGTH - Scapular Protractors, Supine  Lie on your back on a firm surface. Extend your right / left arm straight into the air while holding a __________ weight in your hand.  Keeping your head and back in place, lift your shoulder off the floor.  Hold __________ seconds. Slowly return to the starting  position and allow your muscles to relax completely before completing the next repetition. Repeat __________ times. Complete this exercise __________ times per day. STRENGTH - Scapular Protractors, Quadruped  Get onto your hands and knees with your shoulders directly over your hands (or as close as you comfortably can be).  Keeping your elbows locked, lift the back of your rib cage up into your shoulder blades so your mid-back rounds-out. Keep your neck muscles relaxed.  Hold this position for __________ seconds. Slowly return to the starting position and allow your muscles to relax completely before completing the next repetition. Repeat __________ times. Complete this exercise __________ times per day.  STRENGTH - Scapular Depressors  Keeping your feet on the floor, lift your bottom from the seat and lock your elbows.  Keeping your elbows straight, allow gravity to pull your body weight down. Your shoulders will rise toward your ears.  Raise your body against gravity by drawing your shoulder blades down your back, shortening the distance between your shoulders and ears. Although your feet should always maintain contact with the floor, your feet should progressively support less body weight as you get stronger.  Hold __________ seconds. In a controlled and slow manner, lower your body weight to begin the next repetition. Repeat __________ times. Complete this exercise __________ times per day.  STRENGTH - Shoulder Extensors, Prone  Lie on your stomach on a firm surface so that your right / left arm overhangs the edge. Rest your forehead on your opposite forearm. With your thumb facing away from your body and your elbow straight, hold a __________ weight in your hand.  Squeeze your right / left shoulder blade to your mid-back spine and then slowly raise your arm behind you to the height of the bed.  Hold for __________ seconds. Slowly reverse the directions and return to the starting  position, controlling the weight as you lower your arm. Repeat __________ times. Complete this exercise __________ times per day.  STRENGTH - Horizontal Abductors Choose one of the two oppositions to complete this exercise. Prone: lying on stomach:  Lie on your stomach on a firm surface so that your right / left arm overhangs the edge. Rest your forehead on your opposite forearm. With your palm facing the floor and your elbow straight, hold a __________ weight in your hand.  Squeeze your right / left shoulder blade to your mid-back spine and then slowly raise your arm to the height of the bed.  Hold for __________ seconds. Slowly reverse the directions and return to the starting position, controlling the weight as you lower your arm. Repeat __________ times. Complete this exercise __________ times per day. Standing:  Secure a rubber  exercise band/tubing so that it is at the height of your shoulders when you are either standing or sitting on a firm arm-less chair.  Grasp an end of the band/tubing in each hand and have your palms face each other. Straighten your elbows and lift your hands straight in front of you at shoulder height. Step back away from the secured end of band/tubing until it becomes tense.  Squeeze your shoulder blades together. Keeping your elbows locked and your hands at shoulder-height, bring your hands out to your side.  Hold __________ seconds. Slowly ease the tension on the band/tubing as you reverse the directions and return to the starting position. Repeat __________ times. Complete this exercise __________ times per day. STRENGTH - Scapular Retractors  Secure a rubber exercise band/tubing so that it is at the height of your shoulders when you are either standing or sitting on a firm arm-less chair.  With a palm-down grip, grasp an end of the band/tubing in each hand. Straighten your elbows and lift your hands straight in front of you at shoulder height. Step back  away from the secured end of band/tubing until it becomes tense.  Squeezing your shoulder blades together, draw your elbows back as you bend them. Keep your upper arm lifted away from your body throughout the exercise.  Hold __________ seconds. Slowly ease the tension on the band/tubing as you reverse the directions and return to the starting position. Repeat __________ times. Complete this exercise __________ times per day. STRENGTH - Shoulder Extensors   Secure a rubber exercise band/tubing so that it is at the height of your shoulders when you are either standing or sitting on a firm arm-less chair.  With a thumbs-up grip, grasp an end of the band/tubing in each hand. Straighten your elbows and lift your hands straight in front of you at shoulder height. Step back away from the secured end of band/tubing until it becomes tense.  Squeezing your shoulder blades together, pull your hands down to the sides of your thighs. Do not allow your hands to go behind you.  Hold for __________ seconds. Slowly ease the tension on the band/tubing as you reverse the directions and return to the starting position. Repeat __________ times. Complete this exercise __________ times per day.  STRENGTH - Scapular Retractors and External Rotators  Secure a rubber exercise band/tubing so that it is at the height of your shoulders when you are either standing or sitting on a firm arm-less chair.  With a palm-down grip, grasp an end of the band/tubing in each hand. Bend your elbows 90 degrees and lift your elbows to shoulder height at your sides. Step back away from the secured end of band/tubing until it becomes tense.  Squeezing your shoulder blades together, rotate your shoulder so that your upper arm and elbow remain stationary, but your fists travel upward to head-height.  Hold __________ for seconds. Slowly ease the tension on the band/tubing as you reverse the directions and return to the starting  position. Repeat __________ times. Complete this exercise __________ times per day.  STRENGTH - Scapular Retractors and External Rotators, Rowing  Secure a rubber exercise band/tubing so that it is at the height of your shoulders when you are either standing or sitting on a firm arm-less chair.  With a palm-down grip, grasp an end of the band/tubing in each hand. Straighten your elbows and lift your hands straight in front of you at shoulder height. Step back away from the secured end of  band/tubing until it becomes tense.  Step 1: Squeeze your shoulder blades together. Bending your elbows, draw your hands to your chest as if you are rowing a boat. At the end of this motion, your hands and elbow should be at shoulder-height and your elbows should be out to your sides.  Step 2: Rotate your shoulder to raise your hands above your head. Your forearms should be vertical and your upper-arms should be horizontal.  Hold for __________ seconds. Slowly ease the tension on the band/tubing as you reverse the directions and return to the starting position. Repeat __________ times. Complete this exercise __________ times per day.  STRENGTH - Scapular Retractors and Elevators  Secure a rubber exercise band/tubing so that it is at the height of your shoulders when you are either standing or sitting on a firm arm-less chair.  With a thumbs-up grip, grasp an end of the band/tubing in each hand. Step back away from the secured end of band/tubing until it becomes tense.  Squeezing your shoulder blades together, straighten your elbows and lift your hands straight over your head.  Hold for __________ seconds. Slowly ease the tension on the band/tubing as you reverse the directions and return to the starting position. Repeat __________ times. Complete this exercise __________ times per day.  Document Released: 12/14/2005 Document Revised: 03/07/2012 Document Reviewed: 03/28/2009 [facility] Patient Information  2015 [place], [place]. This information is not intended to replace advice given to you by your health care provider. Make sure you discuss any questions you have with your health care provider.

## 2014-11-26 NOTE — Assessment & Plan Note (Signed)
Patient did respond very well to manipulation therapy today. Patient is not having any numbness or weakness Y would not do any significant further imaging at this time. I do feel x-rays would be beneficial. Do not feel that advance imaging is necessary. Patient did respond well to manipulation today. We discussed home exercises and postural exercises. Patient was given a new handout today. Patient will work on these different changes and come back and see me again in 3 weeks.

## 2014-11-27 ENCOUNTER — Encounter: Payer: Self-pay | Admitting: Family Medicine

## 2014-11-27 ENCOUNTER — Other Ambulatory Visit: Payer: Self-pay | Admitting: Family Medicine

## 2014-11-27 MED ORDER — GABAPENTIN 100 MG PO CAPS: 200.0000 mg | ORAL_CAPSULE | Freq: Every day | ORAL | Status: DC

## 2014-12-12 ENCOUNTER — Other Ambulatory Visit: Payer: Self-pay | Admitting: Family Medicine

## 2014-12-12 ENCOUNTER — Other Ambulatory Visit: Payer: Self-pay

## 2014-12-16 ENCOUNTER — Encounter: Payer: Self-pay | Admitting: Family Medicine

## 2014-12-17 ENCOUNTER — Ambulatory Visit: Payer: BC Managed Care – PPO | Admitting: Family Medicine

## 2015-01-11 ENCOUNTER — Other Ambulatory Visit: Payer: Self-pay | Admitting: Family Medicine

## 2015-01-11 NOTE — Telephone Encounter (Signed)
Rx sent to the pharmacy by e-script.//AB/CMA 

## 2015-03-04 ENCOUNTER — Telehealth: Payer: Self-pay

## 2015-03-04 NOTE — Telephone Encounter (Signed)
Left a message for call back.   Flu-08/28/14 Pap-03/28/13- normal per patient Tdap-01/27/11 Mmg-? CCS- ?

## 2015-03-05 ENCOUNTER — Ambulatory Visit (INDEPENDENT_AMBULATORY_CARE_PROVIDER_SITE_OTHER): Payer: BLUE CROSS/BLUE SHIELD | Admitting: Family Medicine

## 2015-03-05 ENCOUNTER — Encounter: Payer: Self-pay | Admitting: Family Medicine

## 2015-03-05 VITALS — BP 115/71 | HR 58 | Temp 98.0°F | Wt 178.2 lb

## 2015-03-05 DIAGNOSIS — F17201 Nicotine dependence, unspecified, in remission: Secondary | ICD-10-CM | POA: Diagnosis not present

## 2015-03-05 DIAGNOSIS — R030 Elevated blood-pressure reading, without diagnosis of hypertension: Secondary | ICD-10-CM

## 2015-03-05 DIAGNOSIS — F419 Anxiety disorder, unspecified: Secondary | ICD-10-CM

## 2015-03-05 DIAGNOSIS — D649 Anemia, unspecified: Secondary | ICD-10-CM | POA: Diagnosis not present

## 2015-03-05 DIAGNOSIS — F418 Other specified anxiety disorders: Secondary | ICD-10-CM

## 2015-03-05 DIAGNOSIS — K219 Gastro-esophageal reflux disease without esophagitis: Secondary | ICD-10-CM

## 2015-03-05 DIAGNOSIS — E162 Hypoglycemia, unspecified: Secondary | ICD-10-CM | POA: Diagnosis not present

## 2015-03-05 DIAGNOSIS — F329 Major depressive disorder, single episode, unspecified: Secondary | ICD-10-CM

## 2015-03-05 DIAGNOSIS — F32A Depression, unspecified: Secondary | ICD-10-CM

## 2015-03-05 DIAGNOSIS — Z0000 Encounter for general adult medical examination without abnormal findings: Secondary | ICD-10-CM

## 2015-03-05 DIAGNOSIS — D62 Acute posthemorrhagic anemia: Secondary | ICD-10-CM | POA: Insufficient documentation

## 2015-03-05 DIAGNOSIS — G4700 Insomnia, unspecified: Secondary | ICD-10-CM

## 2015-03-05 DIAGNOSIS — IMO0001 Reserved for inherently not codable concepts without codable children: Secondary | ICD-10-CM

## 2015-03-05 LAB — COMPREHENSIVE METABOLIC PANEL
ALT: 13 U/L (ref 0–35)
AST: 18 U/L (ref 0–37)
Albumin: 4.5 g/dL (ref 3.5–5.2)
Alkaline Phosphatase: 53 U/L (ref 39–117)
BUN: 12 mg/dL (ref 6–23)
CO2: 28 mEq/L (ref 19–32)
Calcium: 9.5 mg/dL (ref 8.4–10.5)
Chloride: 104 mEq/L (ref 96–112)
Creatinine, Ser: 0.73 mg/dL (ref 0.40–1.20)
GFR: 92.17 mL/min (ref >60.00)
Glucose, Bld: 82 mg/dL (ref 70–99)
Potassium: 4.9 mEq/L (ref 3.5–5.1)
Sodium: 134 mEq/L — ABNORMAL LOW (ref 135–145)
Total Bilirubin: 0.6 mg/dL (ref 0.2–1.2)
Total Protein: 7.8 g/dL (ref 6.0–8.3)

## 2015-03-05 LAB — CBC
HCT: 35.6 % — ABNORMAL LOW (ref 36.0–46.0)
Hemoglobin: 11.7 g/dL — ABNORMAL LOW (ref 12.0–15.0)
MCHC: 33 g/dL (ref 30.0–36.0)
MCV: 89.1 fl (ref 78.0–100.0)
Platelets: 254 10*3/uL (ref 150.0–400.0)
RBC: 3.99 Mil/uL (ref 3.87–5.11)
RDW: 15.5 % (ref 11.5–15.5)
WBC: 5.8 10*3/uL (ref 4.0–10.5)

## 2015-03-05 MED ORDER — ZOLPIDEM TARTRATE 10 MG PO TABS: 10.0000 mg | ORAL_TABLET | Freq: Every evening | ORAL | Status: DC | PRN

## 2015-03-05 MED ORDER — ALPRAZOLAM 0.25 MG PO TABS: 0.2500 mg | ORAL_TABLET | Freq: Two times a day (BID) | ORAL | Status: DC | PRN

## 2015-03-05 MED ORDER — ESCITALOPRAM OXALATE 20 MG PO TABS: 20.0000 mg | ORAL_TABLET | Freq: Every day | ORAL | Status: DC

## 2015-03-05 MED ORDER — OMEPRAZOLE 20 MG PO CPDR: 20.0000 mg | DELAYED_RELEASE_CAPSULE | Freq: Every day | ORAL | Status: DC

## 2015-03-05 NOTE — Progress Notes (Signed)
Pre visit review using our clinic review tool, if applicable. No additional management support is needed unless otherwise documented below in the visit note. 

## 2015-03-05 NOTE — Assessment & Plan Note (Signed)
No cigarette since 10/15 encouraged ongoing

## 2015-03-05 NOTE — Assessment & Plan Note (Signed)
Patient encouraged to maintain heart healthy diet, regular exercise, adequate sleep. Consider daily probiotics. Take medications as prescribed 

## 2015-03-05 NOTE — Patient Instructions (Signed)
Insomnia Insomnia is frequent trouble falling and/or staying asleep. Insomnia can be a long term problem or a short term problem. Both are common. Insomnia can be a short term problem when the wakefulness is related to a certain stress or worry. Long term insomnia is often related to ongoing stress during waking hours and/or poor sleeping habits. Overtime, sleep deprivation itself can make the problem worse. Every little thing feels more severe because you are overtired and your ability to cope is decreased. CAUSES   Stress, anxiety, and depression.  Poor sleeping habits.  Distractions such as TV in the bedroom.  Naps close to bedtime.  Engaging in emotionally charged conversations before bed.  Technical reading before sleep.  Alcohol and other sedatives. They may make the problem worse. They can hurt normal sleep patterns and normal dream activity.  Stimulants such as caffeine for several hours prior to bedtime.  Pain syndromes and shortness of breath can cause insomnia.  Exercise late at night.  Changing time zones may cause sleeping problems (jet lag). It is sometimes helpful to have someone observe your sleeping patterns. They should look for periods of not breathing during the night (sleep apnea). They should also look to see how long those periods last. If you live alone or observers are uncertain, you can also be observed at a sleep clinic where your sleep patterns will be professionally monitored. Sleep apnea requires a checkup and treatment. Give your caregivers your medical history. Give your caregivers observations your family has made about your sleep.  SYMPTOMS   Not feeling rested in the morning.  Anxiety and restlessness at bedtime.  Difficulty falling and staying asleep. TREATMENT   Your caregiver may prescribe treatment for an underlying medical disorders. Your caregiver can give advice or help if you are using alcohol or other drugs for self-medication. Treatment  of underlying problems will usually eliminate insomnia problems.  Medications can be prescribed for short time use. They are generally not recommended for lengthy use.  Over-the-counter sleep medicines are not recommended for lengthy use. They can be habit forming.  You can promote easier sleeping by making lifestyle changes such as:  Using relaxation techniques that help with breathing and reduce muscle tension.  Exercising earlier in the day.  Changing your diet and the time of your last meal. No night time snacks.  Establish a regular time to go to bed.  Counseling can help with stressful problems and worry.  Soothing music and white noise may be helpful if there are background noises you cannot remove.  Stop tedious detailed work at least one hour before bedtime. HOME CARE INSTRUCTIONS   Keep a diary. Inform your caregiver about your progress. This includes any medication side effects. See your caregiver regularly. Take note of:  Times when you are asleep.  Times when you are awake during the night.  The quality of your sleep.  How you feel the next day. This information will help your caregiver care for you.  Get out of bed if you are still awake after 15 minutes. Read or do some quiet activity. Keep the lights down. Wait until you feel sleepy and go back to bed.  Keep regular sleeping and waking hours. Avoid naps.  Exercise regularly.  Avoid distractions at bedtime. Distractions include watching television or engaging in any intense or detailed activity like attempting to balance the household checkbook.  Develop a bedtime ritual. Keep a familiar routine of bathing, brushing your teeth, climbing into bed at the same   time each night, listening to soothing music. Routines increase the success of falling to sleep faster.  Use relaxation techniques. This can be using breathing and muscle tension release routines. It can also include visualizing peaceful scenes. You can  also help control troubling or intruding thoughts by keeping your mind occupied with boring or repetitive thoughts like the old concept of counting sheep. You can make it more creative like imagining planting one beautiful flower after another in your backyard garden.  During your day, work to eliminate stress. When this is not possible use some of the previous suggestions to help reduce the anxiety that accompanies stressful situations. MAKE SURE YOU:   Understand these instructions.  Will watch your condition.  Will get help right away if you are not doing well or get worse. Document Released: 12/11/2000 Document Revised: 03/07/2012 Document Reviewed: 01/11/2008 Exit[employer] Patient Information 2015 [employer]. This information is not intended to replace advice given to you by your health care provider. Make sure you discuss any questions you have with your health care provider.  

## 2015-03-05 NOTE — Progress Notes (Signed)
Samantha Morales  [id number] 05/05/1971 03/05/2015      Progress Note-Follow Up  Subjective  Chief Complaint  Chief Complaint  Patient presents with  . Annual Exam    HPI  Patient is a 44 y.o. female in today for routine medical care. Patient is in today for follow-up. Is generally doing well. This switch from citalopram to Lexapro has been helpful. Her mood is improved and she denies suicidal ideation. She's had no recent illness. She notes zolpidem has been helpful intermittently for sleep. Denies CP/palp/SOB/HA/congestion/fevers/GI or GU c/o. Taking meds as prescribed  Past Medical History  Diagnosis Date  . RESTLESS LEG SYNDROME, HX OF 01/20/2011  . LACERATION OF FINGER 01/27/2011  . GERD 01/20/2011  . DEPRESSION 01/20/2011  . CHICKENPOX, HX OF 01/20/2011  . ANEMIA, HX OF 01/20/2011  . Tobacco abuse 05/12/2011  . Cellulitis of ankle 05/12/2011  . Atypical chest pain 04/15/2012  . Elevated BP 04/15/2012  . Sacroiliac joint pain 07/22/2012  . Sinusitis, acute 12/12/2012  . Insomnia 12/12/2012  . Back pain 07/22/2012  . Preventative health care 02/28/2014  . Tobacco abuse, in remission 05/12/2011    Past Surgical History  Procedure Laterality Date  . Lasik eye surgery x b/l      Family History  Problem Relation Age of Onset  . Lupus Sister   . Hypertension Brother   . Cancer Maternal Grandmother     breast  . Alzheimer's disease Maternal Grandmother   . Aneurysm Maternal Grandfather     brain  . Arthritis Maternal Grandfather     rheumatoid  . Nephrolithiasis Paternal Grandmother   . Heart attack Paternal Grandfather   . Anemia Brother   . Hypertension Father     History   Social History  . Marital Status: Married    Spouse Name: N/A  . Number of Children: N/A  . Years of Education: N/A   Occupational History  . Not on file.   Social History Main Topics  . Smoking status: Former Smoker -- 0.50 packs/day    Quit date: 04/12/2012  . Smokeless tobacco: Never  Used  . Alcohol Use: 0.0 oz/week    0 drink(s) per week     Comment: 6-7 a week  . Drug Use: No  . Sexual Activity:    Partners: Male     Comment: no dietary, lives with husband, wears seatbelt   Other Topics Concern  . Not on file   Social History Narrative    Current Outpatient Prescriptions on File Prior to Visit  Medication Sig Dispense Refill  . Cholecalciferol (EQL VITAMIN D3) 1000 UNITS tablet Take 1,000 Units by mouth daily.      . Ferrous Fumarate-Folic Acid 284-1 MG TABS Take 1 tablet by mouth daily. 30 each 2  . ALPRAZolam (XANAX) 0.25 MG tablet Take 1 tablet (0.25 mg total) by mouth 2 (two) times daily as needed for anxiety or sleep. (Patient not taking: Reported on 03/05/2015) 30 tablet 2  . ibuprofen (ADVIL,MOTRIN) 200 MG tablet Take 200 mg by mouth every 6 (six) hours as needed.    . mupirocin ointment (BACTROBAN) 2 % Apply 1 application topically daily. Apply to lesion on face and in nose at bedtime daily (Patient not taking: Reported on 03/05/2015) 22 g 2   No current facility-administered medications on file prior to visit.    Allergies  Allergen Reactions  . Erythromycin     REACTION: rash  . Penicillins     REACTION: swelling,  rash  . Sulfonamide Derivatives     REACTION: GI upset    Review of Systems  Review of Systems  Constitutional: Negative for fever, chills and malaise/fatigue.  HENT: Negative for congestion, hearing loss and nosebleeds.   Eyes: Negative for discharge.  Respiratory: Negative for cough, sputum production, shortness of breath and wheezing.   Cardiovascular: Negative for chest pain, palpitations and leg swelling.  Gastrointestinal: Negative for heartburn, nausea, vomiting, abdominal pain, diarrhea, constipation and blood in stool.  Genitourinary: Negative for dysuria, urgency, frequency and hematuria.  Musculoskeletal: Negative for myalgias, back pain and falls.  Skin: Negative for rash.  Neurological: Negative for dizziness,  tremors, sensory change, focal weakness, loss of consciousness, weakness and headaches.  Endo/Heme/Allergies: Negative for polydipsia. Does not bruise/bleed easily.  Psychiatric/Behavioral: Positive for depression. Negative for suicidal ideas. The patient has insomnia. The patient is not nervous/anxious.     Objective  BP 115/71 mmHg  Pulse 58  Temp(Src) 98 F (36.7 C) (Oral)  Wt 178 lb 4 oz (80.854 kg)  SpO2 100%  LMP 02/16/2015  Physical Exam  Physical Exam  Constitutional: She is oriented to person, place, and time and well-developed, well-nourished, and in no distress. No distress.  HENT:  Head: Normocephalic and atraumatic.  Right Ear: External ear normal.  Left Ear: External ear normal.  Nose: Nose normal.  Mouth/Throat: Oropharynx is clear and moist. No oropharyngeal exudate.  Eyes: Conjunctivae are normal. Pupils are equal, round, and reactive to light. Right eye exhibits no discharge. Left eye exhibits no discharge. No scleral icterus.  Neck: Normal range of motion. Neck supple. No thyromegaly present.  Cardiovascular: Normal rate, regular rhythm, normal heart sounds and intact distal pulses.   No murmur heard. Pulmonary/Chest: Effort normal and breath sounds normal. No respiratory distress. She has no wheezes. She has no rales.  Abdominal: Soft. Bowel sounds are normal. She exhibits no distension and no mass. There is no tenderness.  Musculoskeletal: Normal range of motion. She exhibits no edema or tenderness.  Lymphadenopathy:    She has no cervical adenopathy.  Neurological: She is alert and oriented to person, place, and time. She has normal reflexes. No cranial nerve deficit. Coordination normal.  Skin: Skin is warm and dry. No rash noted. She is not diaphoretic.  Psychiatric: Mood, memory and affect normal.    Lab Results  Component Value Date   TSH 2.07 08/28/2014   Lab Results  Component Value Date   WBC 4.9 08/28/2014   HGB 11.2* 08/28/2014   HCT 34.6*  08/28/2014   MCV 91.7 08/28/2014   PLT 251.0 08/28/2014   Lab Results  Component Value Date   CREATININE 0.8 08/28/2014   BUN 10 08/28/2014   NA 137 08/28/2014   K 4.5 08/28/2014   CL 103 08/28/2014   CO2 27 08/28/2014   Lab Results  Component Value Date   ALT 21 08/28/2014   AST 28 08/28/2014   ALKPHOS 65 08/28/2014   BILITOT 0.9 08/28/2014   Lab Results  Component Value Date   CHOL 160 08/28/2014   Lab Results  Component Value Date   HDL 93.20 08/28/2014   Lab Results  Component Value Date   LDLCALC 59 08/28/2014   Lab Results  Component Value Date   TRIG 37.0 08/28/2014   Lab Results  Component Value Date   CHOLHDL 2 08/28/2014     Assessment & Plan  Preventative health care Patient encouraged to maintain heart healthy diet, regular exercise, adequate sleep. Consider daily  probiotics. Take medications as prescribed   Tobacco abuse, in remission No cigarette since 10/15 encouraged ongoing    GERD Avoid offending foods, start probiotics. Do not eat large meals in late evening and consider raising head of bed.    Elevated BP Well controlled. Encouraged heart healthy diet such as the DASH diet and exercise as tolerated.   Anemia Mild. Increase leafy greens, consider increased lean red meat and using cast iron cookware. Continue to monitor, report any concerns   Insomnia Encouraged good sleep hygiene such as dark, quiet room. No blue/green glowing lights such as computer screens in bedroom. No alcohol or stimulants in evening. Cut down on caffeine as able. Regular exercise is helpful but not just prior to bed time. Given Zolpidem to use prn

## 2015-03-13 ENCOUNTER — Encounter: Payer: Self-pay | Admitting: Family Medicine

## 2015-03-13 DIAGNOSIS — D649 Anemia, unspecified: Secondary | ICD-10-CM

## 2015-03-13 HISTORY — DX: Anemia, unspecified: D64.9

## 2015-03-13 NOTE — Assessment & Plan Note (Signed)
Well controlled. Encouraged heart healthy diet such as the DASH diet and exercise as tolerated.  

## 2015-03-13 NOTE — Assessment & Plan Note (Signed)
Encouraged good sleep hygiene such as dark, quiet room. No blue/green glowing lights such as computer screens in bedroom. No alcohol or stimulants in evening. Cut down on caffeine as able. Regular exercise is helpful but not just prior to bed time. Given Zolpidem to use prn

## 2015-03-13 NOTE — Assessment & Plan Note (Signed)
Avoid offending foods, start probiotics. Do not eat large meals in late evening and consider raising head of bed.  

## 2015-03-13 NOTE — Assessment & Plan Note (Signed)
Mild Increase leafy greens, consider increased lean red meat and using cast iron cookware. Continue to monitor, report any concerns 

## 2015-03-14 NOTE — Telephone Encounter (Signed)
Unable to reach patient prior to visit  

## 2015-07-30 LAB — HM PAP SMEAR: HM Pap smear: NORMAL

## 2015-09-05 ENCOUNTER — Ambulatory Visit: Payer: BLUE CROSS/BLUE SHIELD | Admitting: Family Medicine

## 2015-10-07 ENCOUNTER — Ambulatory Visit (INDEPENDENT_AMBULATORY_CARE_PROVIDER_SITE_OTHER): Payer: BLUE CROSS/BLUE SHIELD | Admitting: Family Medicine

## 2015-10-07 ENCOUNTER — Encounter: Payer: Self-pay | Admitting: Family Medicine

## 2015-10-07 VITALS — BP 104/64 | HR 73 | Temp 98.9°F | Ht 67.0 in | Wt 180.2 lb

## 2015-10-07 DIAGNOSIS — K219 Gastro-esophageal reflux disease without esophagitis: Secondary | ICD-10-CM

## 2015-10-07 DIAGNOSIS — IMO0001 Reserved for inherently not codable concepts without codable children: Secondary | ICD-10-CM

## 2015-10-07 DIAGNOSIS — R030 Elevated blood-pressure reading, without diagnosis of hypertension: Secondary | ICD-10-CM

## 2015-10-07 DIAGNOSIS — D649 Anemia, unspecified: Secondary | ICD-10-CM

## 2015-10-07 DIAGNOSIS — E559 Vitamin D deficiency, unspecified: Secondary | ICD-10-CM | POA: Diagnosis not present

## 2015-10-07 DIAGNOSIS — F32A Depression, unspecified: Secondary | ICD-10-CM

## 2015-10-07 DIAGNOSIS — F419 Anxiety disorder, unspecified: Secondary | ICD-10-CM

## 2015-10-07 DIAGNOSIS — G4700 Insomnia, unspecified: Secondary | ICD-10-CM

## 2015-10-07 DIAGNOSIS — Z23 Encounter for immunization: Secondary | ICD-10-CM

## 2015-10-07 DIAGNOSIS — F418 Other specified anxiety disorders: Secondary | ICD-10-CM | POA: Diagnosis not present

## 2015-10-07 DIAGNOSIS — F329 Major depressive disorder, single episode, unspecified: Secondary | ICD-10-CM

## 2015-10-07 MED ORDER — ALPRAZOLAM 0.25 MG PO TABS: 0.2500 mg | ORAL_TABLET | Freq: Two times a day (BID) | ORAL | Status: DC | PRN

## 2015-10-07 MED ORDER — SUVOREXANT 10 MG PO TABS: 10.0000 mg | ORAL_TABLET | Freq: Every day | ORAL | Status: DC

## 2015-10-07 NOTE — Assessment & Plan Note (Signed)
Mild, repeat cbc today 

## 2015-10-07 NOTE — Assessment & Plan Note (Signed)
Takes Lexapro and rarely needs the Alprazolam in daytime. Uses Alprazolam prn for sleep alternately with the Ambien

## 2015-10-07 NOTE — Assessment & Plan Note (Signed)
Has been forgetting to take the Vitamin D will check a level

## 2015-10-07 NOTE — Patient Instructions (Signed)
Insomnia Insomnia is a sleep disorder that makes it difficult to fall asleep or to stay asleep. Insomnia can cause tiredness (fatigue), low energy, difficulty concentrating, mood swings, and poor performance at work or school.  There are three different ways to classify insomnia:  Difficulty falling asleep.  Difficulty staying asleep.  Waking up too early in the morning. Any type of insomnia can be long-term (chronic) or short-term (acute). Both are common. Short-term insomnia usually lasts for three months or less. Chronic insomnia occurs at least three times a week for longer than three months. CAUSES  Insomnia may be caused by another condition, situation, or substance, such as:  Anxiety.  Certain medicines.  Gastroesophageal reflux disease (GERD) or other gastrointestinal conditions.  Asthma or other breathing conditions.  Restless legs syndrome, sleep apnea, or other sleep disorders.  Chronic pain.  Menopause. This may include hot flashes.  Stroke.  Abuse of alcohol, tobacco, or illegal drugs.  Depression.  Caffeine.   Neurological disorders, such as Alzheimer disease.  An overactive thyroid (hyperthyroidism). The cause of insomnia may not be known. RISK FACTORS Risk factors for insomnia include:  Gender. Women are more commonly affected than men.  Age. Insomnia is more common as you get older.  Stress. This may involve your professional or personal life.  Income. Insomnia is more common in people with lower income.  Lack of exercise.   Irregular work schedule or night shifts.  Traveling between different time zones. SIGNS AND SYMPTOMS If you have insomnia, trouble falling asleep or trouble staying asleep is the main symptom. This may lead to other symptoms, such as:  Feeling fatigued.  Feeling nervous about going to sleep.  Not feeling rested in the morning.  Having trouble concentrating.  Feeling irritable, anxious, or depressed. TREATMENT   Treatment for insomnia depends on the cause. If your insomnia is caused by an underlying condition, treatment will focus on addressing the condition. Treatment may also include:   Medicines to help you sleep.  Counseling or therapy.  Lifestyle adjustments. HOME CARE INSTRUCTIONS   Take medicines only as directed by your health care provider.  Keep regular sleeping and waking hours. Avoid naps.  Keep a sleep diary to help you and your health care provider figure out what could be causing your insomnia. Include:   When you sleep.  When you wake up during the night.  How well you sleep.   How rested you feel the next day.  Any side effects of medicines you are taking.  What you eat and drink.   Make your bedroom a comfortable place where it is easy to fall asleep:  Put up shades or special blackout curtains to block light from outside.  Use a white noise machine to block noise.  Keep the temperature cool.   Exercise regularly as directed by your health care provider. Avoid exercising right before bedtime.  Use relaxation techniques to manage stress. Ask your health care provider to suggest some techniques that may work well for you. These may include:  Breathing exercises.  Routines to release muscle tension.  Visualizing peaceful scenes.  Cut back on alcohol, caffeinated beverages, and cigarettes, especially close to bedtime. These can disrupt your sleep.  Do not overeat or eat spicy foods right before bedtime. This can lead to digestive discomfort that can make it hard for you to sleep.  Limit screen use before bedtime. This includes:  Watching TV.  Using your smartphone, tablet, and computer.  Stick to a routine. This   can help you fall asleep faster. Try to do a quiet activity, brush your teeth, and go to bed at the same time each night.  Get out of bed if you are still awake after 15 minutes of trying to sleep. Keep the lights down, but try reading or  doing a quiet activity. When you feel sleepy, go back to bed.  Make sure that you drive carefully. Avoid driving if you feel very sleepy.  Keep all follow-up appointments as directed by your health care provider. This is important. SEEK MEDICAL CARE IF:   You are tired throughout the day or have trouble in your daily routine due to sleepiness.  You continue to have sleep problems or your sleep problems get worse. SEEK IMMEDIATE MEDICAL CARE IF:   You have serious thoughts about hurting yourself or someone else.   This information is not intended to replace advice given to you by your health care provider. Make sure you discuss any questions you have with your health care provider.   Document Released: 12/11/2000 Document Revised: 09/04/2015 Document Reviewed: 09/14/2014 Elsevier Interactive Patient Education 2016 [employer]  

## 2015-10-07 NOTE — Assessment & Plan Note (Addendum)
Avoid offending foods, start probiotics. Do not eat large meals in late evening and consider raising head of bed. Unfortunately her heartburn requires Rolaids several times a day. Will increase Omeprazole to 40 mg daily

## 2015-10-07 NOTE — Assessment & Plan Note (Signed)
Encouraged good sleep hygiene such as dark, quiet room. No blue/green glowing lights such as computer screens in bedroom. No alcohol or stimulants in evening. Cut down on caffeine as able. Regular exercise is helpful but not just prior to bed time. Alternates the Ambien with the Alprazolam.

## 2015-10-07 NOTE — Progress Notes (Signed)
Pre visit review using our clinic review tool, if applicable. No additional management support is needed unless otherwise documented below in the visit note. 

## 2015-10-07 NOTE — Assessment & Plan Note (Signed)
Well controlled. Encouraged heart healthy diet such as the DASH diet and exercise as tolerated.  

## 2015-10-08 LAB — COMPREHENSIVE METABOLIC PANEL
ALT: 13 U/L (ref 0–35)
AST: 18 U/L (ref 0–37)
Albumin: 4.1 g/dL (ref 3.5–5.2)
Alkaline Phosphatase: 58 U/L (ref 39–117)
BUN: 14 mg/dL (ref 6–23)
CO2: 25 mEq/L (ref 19–32)
Calcium: 9.1 mg/dL (ref 8.4–10.5)
Chloride: 105 mEq/L (ref 96–112)
Creatinine, Ser: 0.77 mg/dL (ref 0.40–1.20)
GFR: 86.43 mL/min (ref >60.00)
Glucose, Bld: 74 mg/dL (ref 70–99)
Potassium: 4.2 mEq/L (ref 3.5–5.1)
Sodium: 138 mEq/L (ref 135–145)
Total Bilirubin: 0.2 mg/dL (ref 0.2–1.2)
Total Protein: 7.3 g/dL (ref 6.0–8.3)

## 2015-10-08 LAB — CBC
HCT: 35 % — ABNORMAL LOW (ref 36.0–46.0)
Hemoglobin: 11.6 g/dL — ABNORMAL LOW (ref 12.0–15.0)
MCHC: 33.2 g/dL (ref 30.0–36.0)
MCV: 94.5 fl (ref 78.0–100.0)
Platelets: 306 10*3/uL (ref 150.0–400.0)
RBC: 3.71 Mil/uL — ABNORMAL LOW (ref 3.87–5.11)
RDW: 14.6 % (ref 11.5–15.5)
WBC: 7.9 10*3/uL (ref 4.0–10.5)

## 2015-10-08 LAB — TSH: TSH: 0.97 u[IU]/mL (ref 0.35–4.50)

## 2015-10-12 DIAGNOSIS — F419 Anxiety disorder, unspecified: Secondary | ICD-10-CM | POA: Insufficient documentation

## 2015-10-12 DIAGNOSIS — F32A Depression, unspecified: Secondary | ICD-10-CM | POA: Insufficient documentation

## 2015-10-12 DIAGNOSIS — F329 Major depressive disorder, single episode, unspecified: Secondary | ICD-10-CM | POA: Insufficient documentation

## 2015-10-12 NOTE — Progress Notes (Signed)
Subjective:    Patient ID: Samantha Morales, female    DOB: Jun 04, 1971, 44 y.o.   MRN: [id number]  Chief Complaint  Patient presents with  . Follow-up    HPI Patient is in today for follow-up. Had been doing fairly well but has a bit of a headache and some nausea today. No photophobia or phonophobia. No recent illness or fevers although she has noted some congestion over the last few days. Continues to struggle with insomnia and uses zolpidem and alprazolam intermittently. Denies CP/palp/SOB/congestion/fevers or GU c/o. Taking meds as prescribed  Past Medical History  Diagnosis Date  . RESTLESS LEG SYNDROME, HX OF 01/20/2011  . LACERATION OF FINGER 01/27/2011  . GERD 01/20/2011  . DEPRESSION 01/20/2011  . CHICKENPOX, HX OF 01/20/2011  . ANEMIA, HX OF 01/20/2011  . Tobacco abuse 05/12/2011  . Cellulitis of ankle 05/12/2011  . Atypical chest pain 04/15/2012  . Elevated BP 04/15/2012  . Sacroiliac joint pain 07/22/2012  . Sinusitis, acute 12/12/2012  . Insomnia 12/12/2012  . Back pain 07/22/2012  . Preventative health care 02/28/2014  . Tobacco abuse, in remission 05/12/2011  . Anemia 03/13/2015    Past Surgical History  Procedure Laterality Date  . Lasik eye surgery x b/l      Family History  Problem Relation Age of Onset  . Lupus Sister   . Hypertension Brother   . Cancer Maternal Grandmother     breast  . Alzheimer's disease Maternal Grandmother   . Aneurysm Maternal Grandfather     brain  . Arthritis Maternal Grandfather     rheumatoid  . Nephrolithiasis Paternal Grandmother   . Heart attack Paternal Grandfather   . Anemia Brother   . Hypertension Father     Social History   Social History  . Marital Status: Married    Spouse Name: N/A  . Number of Children: N/A  . Years of Education: N/A   Occupational History  . Not on file.   Social History Main Topics  . Smoking status: Former Smoker -- 0.50 packs/day    Quit date: 04/12/2012  . Smokeless tobacco: Never  Used  . Alcohol Use: 0.0 oz/week    0 drink(s) per week     Comment: 6-7 a week  . Drug Use: No  . Sexual Activity:    Partners: Male     Comment: no dietary, lives with husband, wears seatbelt   Other Topics Concern  . Not on file   Social History Narrative    Outpatient Prescriptions Prior to Visit  Medication Sig Dispense Refill  . escitalopram (LEXAPRO) 20 MG tablet Take 1 tablet (20 mg total) by mouth daily. 30 tablet 11  . ibuprofen (ADVIL,MOTRIN) 200 MG tablet Take 200 mg by mouth every 6 (six) hours as needed.    . mupirocin ointment (BACTROBAN) 2 % Apply 1 application topically daily. Apply to lesion on face and in nose at bedtime daily 22 g 2  . omeprazole (PRILOSEC) 20 MG capsule Take 1 capsule (20 mg total) by mouth daily. 30 capsule 11  . ALPRAZolam (XANAX) 0.25 MG tablet Take 1 tablet (0.25 mg total) by mouth 2 (two) times daily as needed for anxiety or sleep. 30 tablet 3  . Cholecalciferol (EQL VITAMIN D3) 1000 UNITS tablet Take 1,000 Units by mouth daily.      . Ferrous Fumarate-Folic Acid 706-2 MG TABS Take 1 tablet by mouth daily. (Patient not taking: Reported on 10/07/2015) 30 each 2   No facility-administered  medications prior to visit.    Allergies  Allergen Reactions  . Erythromycin     REACTION: rash  . Penicillins     REACTION: swelling, rash  . Sulfonamide Derivatives     REACTION: GI upset    Review of Systems  Constitutional: Negative for fever and malaise/fatigue.  HENT: Positive for congestion.   Eyes: Negative for discharge.  Respiratory: Negative for shortness of breath.   Cardiovascular: Negative for chest pain, palpitations and leg swelling.  Gastrointestinal: Positive for nausea. Negative for heartburn, vomiting and abdominal pain.  Genitourinary: Negative for dysuria.  Musculoskeletal: Negative for falls.  Skin: Negative for rash.  Neurological: Positive for headaches. Negative for loss of consciousness.  Endo/Heme/Allergies:  Negative for environmental allergies.  Psychiatric/Behavioral: Negative for depression. The patient is not nervous/anxious.        Objective:    Physical Exam  Constitutional: She is oriented to person, place, and time. She appears well-developed and well-nourished. No distress.  HENT:  Head: Normocephalic and atraumatic.  Nose: Nose normal.  Eyes: Right eye exhibits no discharge. Left eye exhibits no discharge.  Neck: Normal range of motion. Neck supple.  Cardiovascular: Normal rate and regular rhythm.   No murmur heard. Pulmonary/Chest: Effort normal and breath sounds normal.  Abdominal: Soft. Bowel sounds are normal. There is no tenderness.  Musculoskeletal: She exhibits no edema.  Neurological: She is alert and oriented to person, place, and time.  Skin: Skin is warm and dry.  Psychiatric: She has a normal mood and affect.  Nursing note and vitals reviewed.   BP 104/64 mmHg  Pulse 73  Temp(Src) 98.9 F (37.2 C) (Oral)  Ht $R'5\' 7"'lH$  (1.702 m)  Wt 180 lb 4 oz (81.761 kg)  BMI 28.22 kg/m2  SpO2 98% Wt Readings from Last 3 Encounters:  10/07/15 180 lb 4 oz (81.761 kg)  03/05/15 178 lb 4 oz (80.854 kg)  11/26/14 176 lb (79.833 kg)     Lab Results  Component Value Date   WBC 7.9 10/07/2015   HGB 11.6* 10/07/2015   HCT 35.0* 10/07/2015   PLT 306.0 10/07/2015   GLUCOSE 74 10/07/2015   CHOL 160 08/28/2014   TRIG 37.0 08/28/2014   HDL 93.20 08/28/2014   LDLCALC 59 08/28/2014   ALT 13 10/07/2015   AST 18 10/07/2015   NA 138 10/07/2015   K 4.2 10/07/2015   CL 105 10/07/2015   CREATININE 0.77 10/07/2015   BUN 14 10/07/2015   CO2 25 10/07/2015   TSH 0.97 10/07/2015    Lab Results  Component Value Date   TSH 0.97 10/07/2015   Lab Results  Component Value Date   WBC 7.9 10/07/2015   HGB 11.6* 10/07/2015   HCT 35.0* 10/07/2015   MCV 94.5 10/07/2015   PLT 306.0 10/07/2015   Lab Results  Component Value Date   NA 138 10/07/2015   K 4.2 10/07/2015   CO2 25  10/07/2015   GLUCOSE 74 10/07/2015   BUN 14 10/07/2015   CREATININE 0.77 10/07/2015   BILITOT 0.2 10/07/2015   ALKPHOS 58 10/07/2015   AST 18 10/07/2015   ALT 13 10/07/2015   PROT 7.3 10/07/2015   ALBUMIN 4.1 10/07/2015   CALCIUM 9.1 10/07/2015   GFR 86.43 10/07/2015   Lab Results  Component Value Date   CHOL 160 08/28/2014   Lab Results  Component Value Date   HDL 93.20 08/28/2014   Lab Results  Component Value Date   LDLCALC 59 08/28/2014   Lab  Results  Component Value Date   TRIG 37.0 08/28/2014   Lab Results  Component Value Date   CHOLHDL 2 08/28/2014   No results found for: HGBA1C     Assessment & Plan:   Problem List Items Addressed This Visit    Vitamin D deficiency    Has been forgetting to take the Vitamin D will check a level      Relevant Orders   CBC (Completed)   Comp Met (CMET) (Completed)   TSH (Completed)   Insomnia    Encouraged good sleep hygiene such as dark, quiet room. No blue/green glowing lights such as computer screens in bedroom. No alcohol or stimulants in evening. Cut down on caffeine as able. Regular exercise is helpful but not just prior to bed time. Alternates the Ambien with the Alprazolam.      Relevant Orders   CBC (Completed)   Comp Met (CMET) (Completed)   TSH (Completed)   GERD    Avoid offending foods, start probiotics. Do not eat large meals in late evening and consider raising head of bed. Unfortunately her heartburn requires Rolaids several times a day. Will increase Omeprazole to 40 mg daily      Relevant Orders   CBC (Completed)   Comp Met (CMET) (Completed)   TSH (Completed)   Elevated BP    Well controlled.. Encouraged heart healthy diet such as the DASH diet and exercise as tolerated.       Relevant Orders   CBC (Completed)   Comp Met (CMET) (Completed)   TSH (Completed)   Depression with anxiety    Takes Lexapro and rarely needs the Alprazolam in daytime. Uses Alprazolam prn for sleep alternately  with the Ambien      Relevant Orders   CBC (Completed)   Comp Met (CMET) (Completed)   TSH (Completed)   Anxiety and depression - Primary   Relevant Medications   ALPRAZolam (XANAX) 0.25 MG tablet   Other Relevant Orders   CBC (Completed)   Comp Met (CMET) (Completed)   TSH (Completed)   Anemia    Mild, repeat cbc today      Relevant Orders   CBC (Completed)   Comp Met (CMET) (Completed)   TSH (Completed)    Other Visit Diagnoses    Encounter for immunization           I am having Ms. Wieber maintain her Cholecalciferol, ibuprofen, Ferrous Fumarate-Folic Acid, mupirocin ointment, omeprazole, escitalopram, ALPRAZolam, and Suvorexant.  Meds ordered this encounter  Medications  . ALPRAZolam (XANAX) 0.25 MG tablet    Sig: Take 1 tablet (0.25 mg total) by mouth 2 (two) times daily as needed for anxiety or sleep.    Dispense:  30 tablet    Refill:  3  . DISCONTD: Suvorexant (BELSOMRA) 10 MG TABS    Sig: Take 10-20 mg by mouth at bedtime.    Dispense:  10 tablet    Refill:  0  . Suvorexant (BELSOMRA) 10 MG TABS    Sig: Take 10-20 mg by mouth at bedtime.    Dispense:  30 tablet    Refill:  0     Penni Homans, MD

## 2015-11-19 ENCOUNTER — Encounter: Payer: Self-pay | Admitting: Family Medicine

## 2015-12-05 ENCOUNTER — Ambulatory Visit (INDEPENDENT_AMBULATORY_CARE_PROVIDER_SITE_OTHER): Payer: BLUE CROSS/BLUE SHIELD | Admitting: Family Medicine

## 2015-12-05 ENCOUNTER — Encounter: Payer: Self-pay | Admitting: Family Medicine

## 2015-12-05 VITALS — BP 106/72 | HR 77 | Temp 98.2°F | Resp 16 | Ht 67.0 in | Wt 182.2 lb

## 2015-12-05 DIAGNOSIS — R238 Other skin changes: Secondary | ICD-10-CM

## 2015-12-05 DIAGNOSIS — N921 Excessive and frequent menstruation with irregular cycle: Secondary | ICD-10-CM

## 2015-12-05 DIAGNOSIS — R233 Spontaneous ecchymoses: Secondary | ICD-10-CM | POA: Insufficient documentation

## 2015-12-05 NOTE — Assessment & Plan Note (Signed)
New.  Suspect that this is due to the fact that pt is perimenopausal (based on irregular menses, mood swings, night sweats).  Discussed that she could possibly start OCPs to regulate her cycles- she is going to think about this and discuss w/ either GYN or PCP.  Check labs to r/o other possible causes of abnormal bleeding.  Start OTC menopause products to lessen some of her sxs.  Reviewed supportive care and red flags that should prompt return.  Pt expressed understanding and is in agreement w/ plan.

## 2015-12-05 NOTE — Assessment & Plan Note (Signed)
New.  Pt reports the majority of the bruising occurs on her legs.  I suspect this is due to minor traumas that pt does not recall.  Check labs to r/o abnormalities that could account for easy/abnormal bruising- thyroid, liver, renal, or PT abnormalities.  Provided reassurance to pt that if labs are all normal, there is no additional workup needed at this time.  Will follow.

## 2015-12-05 NOTE — Patient Instructions (Signed)
Follow up w/ Dr Julien Girt or Dr Charlett Blake to review menopausal symptoms and/or possible treatment We'll notify you of your lab results and make any changes if needed Start Black Cohosh or Estroven OTC for menopausal symptoms Call with any questions or concerns Happy Holidays!!!

## 2015-12-05 NOTE — Progress Notes (Signed)
   Subjective:    Patient ID: Samantha Morales, female    DOB: [birth year], 44 y.o.   MRN: [id number]  HPI Abnormal bleeding- 'my periods are really wacky'.  Currently menstruating x2 weeks.  Prior to this only had 1 week between cycles.  'i'm mean'- pt is on Lexapro but 'it's not working'.  Pt reports increased bruising w/o known injury- 1 behind L knee, L pelvic bone.  + night sweats- 'i'm soaked'.   Review of Systems For ROS see HPI     Objective:   Physical Exam  Constitutional: She is oriented to person, place, and time. She appears well-developed and well-nourished. No distress.  HENT:  Head: Normocephalic and atraumatic.  Eyes: Conjunctivae and EOM are normal. Pupils are equal, round, and reactive to light.  Neck: Normal range of motion. Neck supple. No thyromegaly present.  Cardiovascular: Normal rate, regular rhythm, normal heart sounds and intact distal pulses.   No murmur heard. Pulmonary/Chest: Effort normal and breath sounds normal. No respiratory distress.  Abdominal: Soft. She exhibits no distension. There is no tenderness.  Musculoskeletal: She exhibits no edema.  Lymphadenopathy:    She has no cervical adenopathy.  Neurological: She is alert and oriented to person, place, and time.  Skin: Skin is warm and dry.  Bruise on L ASIS  Psychiatric: She has a normal mood and affect. Her behavior is normal.  Vitals reviewed.         Assessment & Plan:

## 2015-12-05 NOTE — Progress Notes (Signed)
Pre visit review using our clinic review tool, if applicable. No additional management support is needed unless otherwise documented below in the visit note. 

## 2015-12-06 ENCOUNTER — Other Ambulatory Visit: Payer: Self-pay | Admitting: Family Medicine

## 2015-12-06 DIAGNOSIS — E875 Hyperkalemia: Secondary | ICD-10-CM

## 2015-12-06 LAB — BASIC METABOLIC PANEL
BUN: 12 mg/dL (ref 6–23)
CO2: 26 mEq/L (ref 19–32)
Calcium: 9 mg/dL (ref 8.4–10.5)
Chloride: 105 mEq/L (ref 96–112)
Creatinine, Ser: 0.84 mg/dL (ref 0.40–1.20)
GFR: 78.11 mL/min (ref >60.00)
Glucose, Bld: 60 mg/dL — ABNORMAL LOW (ref 70–99)
Potassium: 6.3 mEq/L (ref 3.5–5.1)
Sodium: 136 mEq/L (ref 135–145)

## 2015-12-06 LAB — CBC WITH DIFFERENTIAL/PLATELET
Basophils Absolute: 0.1 10*3/uL (ref 0.0–0.1)
Basophils Relative: 1.1 % (ref 0.0–3.0)
Eosinophils Absolute: 0.3 10*3/uL (ref 0.0–0.7)
Eosinophils Relative: 2.8 % (ref 0.0–5.0)
HCT: 38.8 % (ref 36.0–46.0)
Hemoglobin: 12.6 g/dL (ref 12.0–15.0)
Lymphocytes Relative: 19.9 % (ref 12.0–46.0)
Lymphs Abs: 1.8 10*3/uL (ref 0.7–4.0)
MCHC: 32.5 g/dL (ref 30.0–36.0)
MCV: 94.5 fl (ref 78.0–100.0)
Monocytes Absolute: 0.7 10*3/uL (ref 0.1–1.0)
Monocytes Relative: 8.2 % (ref 3.0–12.0)
Neutro Abs: 6 10*3/uL (ref 1.4–7.7)
Neutrophils Relative %: 68 % (ref 43.0–77.0)
Platelets: 308 10*3/uL (ref 150.0–400.0)
RBC: 4.11 Mil/uL (ref 3.87–5.11)
RDW: 14 % (ref 11.5–15.5)
WBC: 8.9 10*3/uL (ref 4.0–10.5)

## 2015-12-06 LAB — HEPATIC FUNCTION PANEL
ALT: 13 U/L (ref 0–35)
AST: 18 U/L (ref 0–37)
Albumin: 4.3 g/dL (ref 3.5–5.2)
Alkaline Phosphatase: 56 U/L (ref 39–117)
Bilirubin, Direct: 0.1 mg/dL (ref 0.0–0.3)
Total Bilirubin: 0.4 mg/dL (ref 0.2–1.2)
Total Protein: 7.5 g/dL (ref 6.0–8.3)

## 2015-12-06 LAB — PROTIME-INR
INR: 1 ratio (ref 0.8–1.0)
Prothrombin Time: 10.4 s (ref 9.6–13.1)

## 2015-12-06 LAB — TSH: TSH: 1.19 u[IU]/mL (ref 0.35–4.50)

## 2016-01-13 ENCOUNTER — Encounter: Payer: Self-pay | Admitting: Family Medicine

## 2016-01-13 ENCOUNTER — Ambulatory Visit (INDEPENDENT_AMBULATORY_CARE_PROVIDER_SITE_OTHER): Payer: BLUE CROSS/BLUE SHIELD | Admitting: Family Medicine

## 2016-01-13 VITALS — BP 110/68 | HR 76 | Temp 98.1°F | Ht 67.0 in | Wt 183.0 lb

## 2016-01-13 DIAGNOSIS — N921 Excessive and frequent menstruation with irregular cycle: Secondary | ICD-10-CM | POA: Diagnosis not present

## 2016-01-13 DIAGNOSIS — IMO0001 Reserved for inherently not codable concepts without codable children: Secondary | ICD-10-CM

## 2016-01-13 DIAGNOSIS — R030 Elevated blood-pressure reading, without diagnosis of hypertension: Secondary | ICD-10-CM

## 2016-01-13 DIAGNOSIS — T148 Other injury of unspecified body region: Secondary | ICD-10-CM

## 2016-01-13 DIAGNOSIS — F329 Major depressive disorder, single episode, unspecified: Secondary | ICD-10-CM

## 2016-01-13 DIAGNOSIS — E559 Vitamin D deficiency, unspecified: Secondary | ICD-10-CM

## 2016-01-13 DIAGNOSIS — G4700 Insomnia, unspecified: Secondary | ICD-10-CM

## 2016-01-13 DIAGNOSIS — F418 Other specified anxiety disorders: Secondary | ICD-10-CM | POA: Diagnosis not present

## 2016-01-13 DIAGNOSIS — T148XXA Other injury of unspecified body region, initial encounter: Secondary | ICD-10-CM

## 2016-01-13 DIAGNOSIS — D649 Anemia, unspecified: Secondary | ICD-10-CM

## 2016-01-13 DIAGNOSIS — E876 Hypokalemia: Secondary | ICD-10-CM

## 2016-01-13 DIAGNOSIS — F32A Depression, unspecified: Secondary | ICD-10-CM

## 2016-01-13 DIAGNOSIS — E663 Overweight: Secondary | ICD-10-CM

## 2016-01-13 DIAGNOSIS — F419 Anxiety disorder, unspecified: Secondary | ICD-10-CM

## 2016-01-13 MED ORDER — DESOGESTREL-ETHINYL ESTRADIOL 0.15-0.02/0.01 MG (21/5) PO TABS: 1.0000 | ORAL_TABLET | Freq: Every day | ORAL | Status: DC

## 2016-01-13 MED ORDER — ALPRAZOLAM 0.25 MG PO TABS: 0.2500 mg | ORAL_TABLET | Freq: Two times a day (BID) | ORAL | Status: DC | PRN

## 2016-01-13 MED ORDER — OMEPRAZOLE 20 MG PO CPDR: 20.0000 mg | DELAYED_RELEASE_CAPSULE | Freq: Every day | ORAL | Status: DC

## 2016-01-13 MED ORDER — ESCITALOPRAM OXALATE 20 MG PO TABS: 20.0000 mg | ORAL_TABLET | Freq: Every day | ORAL | Status: DC

## 2016-01-13 NOTE — Patient Instructions (Addendum)

## 2016-01-13 NOTE — Progress Notes (Signed)
Pre visit review using our clinic review tool, if applicable. No additional management support is needed unless otherwise documented below in the visit note. 

## 2016-01-13 NOTE — Progress Notes (Signed)
Subjective:    Patient ID: Samantha Morales, female    DOB: [birth year], 45 y.o.   MRN: [id number]  Chief Complaint  Patient presents with  . Follow-up    HPI Patient is in today for follow-up. She continues to struggle with depression, anxiety and insomnia. The summer worked well but insurance would not pay for it. She is frustrated with weight gain and also endorses some mild nausea and easy bruising. No other acute complaints or recent illness. She has noted an change in her menstrual cycle this past couple of months with them becoming somewhat more irregular and some occasional hot flashes, she follows with GYN, Dr Julien Girt.  Denies CP/palp/SOB/HA/congestion/fevers/GI or GU c/o. Taking meds as prescribed  Past Medical History  Diagnosis Date  . RESTLESS LEG SYNDROME, HX OF 01/20/2011  . LACERATION OF FINGER 01/27/2011  . GERD 01/20/2011  . DEPRESSION 01/20/2011  . CHICKENPOX, HX OF 01/20/2011  . ANEMIA, HX OF 01/20/2011  . Tobacco abuse 05/12/2011  . Cellulitis of ankle 05/12/2011  . Atypical chest pain 04/15/2012  . Elevated BP 04/15/2012  . Sacroiliac joint pain 07/22/2012  . Sinusitis, acute 12/12/2012  . Insomnia 12/12/2012  . Back pain 07/22/2012  . Preventative health care 02/28/2014  . Tobacco abuse, in remission 05/12/2011  . Anemia 03/13/2015  . Overweight 01/19/2016    Past Surgical History  Procedure Laterality Date  . Lasik eye surgery x b/l      Family History  Problem Relation Age of Onset  . Lupus Sister   . Hypertension Brother   . Cancer Maternal Grandmother     breast  . Alzheimer's disease Maternal Grandmother   . Aneurysm Maternal Grandfather     brain  . Arthritis Maternal Grandfather     rheumatoid  . Nephrolithiasis Paternal Grandmother   . Heart attack Paternal Grandfather   . Anemia Brother   . Hypertension Father     Social History   Social History  . Marital Status: Married    Spouse Name: N/A  . Number of Children: N/A  . Years of Education:  N/A   Occupational History  . Not on file.   Social History Main Topics  . Smoking status: Former Smoker -- 0.50 packs/day    Quit date: 04/12/2012  . Smokeless tobacco: Never Used  . Alcohol Use: 0.0 oz/week    0 drink(s) per week     Comment: 6-7 a week  . Drug Use: No  . Sexual Activity:    Partners: Male     Comment: no dietary, lives with husband, wears seatbelt   Other Topics Concern  . Not on file   Social History Narrative    Outpatient Prescriptions Prior to Visit  Medication Sig Dispense Refill  . Cholecalciferol (EQL VITAMIN D3) 1000 UNITS tablet Take 1,000 Units by mouth daily.      . Ferrous Fumarate-Folic Acid 99991111 MG TABS Take 1 tablet by mouth daily. 30 each 2  . ibuprofen (ADVIL,MOTRIN) 200 MG tablet Take 200 mg by mouth every 6 (six) hours as needed.    . mupirocin ointment (BACTROBAN) 2 % Apply 1 application topically daily. Apply to lesion on face and in nose at bedtime daily 22 g 2  . ALPRAZolam (XANAX) 0.25 MG tablet Take 1 tablet (0.25 mg total) by mouth 2 (two) times daily as needed for anxiety or sleep. 30 tablet 3  . escitalopram (LEXAPRO) 20 MG tablet Take 1 tablet (20 mg total) by mouth daily. [place]  tablet 11  . omeprazole (PRILOSEC) 20 MG capsule Take 1 capsule (20 mg total) by mouth daily. 30 capsule 11   No facility-administered medications prior to visit.    Allergies  Allergen Reactions  . Erythromycin     REACTION: rash  . Penicillins     REACTION: swelling, rash  . Sulfonamide Derivatives     REACTION: GI upset    Review of Systems  Constitutional: Negative for fever and malaise/fatigue.  HENT: Negative for congestion.   Eyes: Negative for discharge.  Respiratory: Negative for shortness of breath.   Cardiovascular: Negative for chest pain, palpitations and leg swelling.  Gastrointestinal: Positive for nausea and abdominal pain.  Genitourinary: Negative for dysuria.  Musculoskeletal: Negative for falls.  Skin: Negative for rash.    Neurological: Negative for loss of consciousness and headaches.  Endo/Heme/Allergies: Negative for environmental allergies.  Psychiatric/Behavioral: Positive for depression. The patient is nervous/anxious and has insomnia.        Objective:    Physical Exam  Constitutional: She is oriented to person, place, and time. She appears well-developed and well-nourished. No distress.  HENT:  Head: Normocephalic and atraumatic.  Nose: Nose normal.  Eyes: Right eye exhibits no discharge. Left eye exhibits no discharge.  Neck: Normal range of motion. Neck supple.  Cardiovascular: Normal rate and regular rhythm.   No murmur heard. Pulmonary/Chest: Effort normal and breath sounds normal.  Abdominal: Soft. Bowel sounds are normal. There is no tenderness.  Musculoskeletal: She exhibits no edema.  Neurological: She is alert and oriented to person, place, and time.  Skin: Skin is warm and dry.  Psychiatric: She has a normal mood and affect.  Nursing note and vitals reviewed.   BP 110/68 mmHg  Pulse 76  Temp(Src) 98.1 F (36.7 C) (Oral)  Ht 5\' 7"  (1.702 m)  Wt 183 lb (83.008 kg)  BMI 28.66 kg/m2  SpO2 98% Wt Readings from Last 3 Encounters:  01/13/16 183 lb (83.008 kg)  12/05/15 182 lb 4 oz (82.668 kg)  10/07/15 180 lb 4 oz (81.761 kg)     Lab Results  Component Value Date   WBC 7.9 01/13/2016   HGB 10.9* 01/13/2016   HCT 33.6* 01/13/2016   PLT 269.0 01/13/2016   GLUCOSE 128* 01/13/2016   CHOL 160 08/28/2014   TRIG 37.0 08/28/2014   HDL 93.20 08/28/2014   LDLCALC 59 08/28/2014   ALT 10 01/13/2016   AST 15 01/13/2016   NA 137 01/13/2016   K 3.4* 01/13/2016   CL 104 01/13/2016   CREATININE 0.73 01/13/2016   BUN 12 01/13/2016   CO2 27 01/13/2016   TSH 1.19 12/05/2015   INR 1.0 12/05/2015    Lab Results  Component Value Date   TSH 1.19 12/05/2015   Lab Results  Component Value Date   WBC 7.9 01/13/2016   HGB 10.9* 01/13/2016   HCT 33.6* 01/13/2016   MCV 94.4  01/13/2016   PLT 269.0 01/13/2016   Lab Results  Component Value Date   NA 137 01/13/2016   K 3.4* 01/13/2016   CO2 27 01/13/2016   GLUCOSE 128* 01/13/2016   BUN 12 01/13/2016   CREATININE 0.73 01/13/2016   BILITOT 0.5 01/13/2016   ALKPHOS 61 01/13/2016   AST 15 01/13/2016   ALT 10 01/13/2016   PROT 7.0 01/13/2016   ALBUMIN 4.0 01/13/2016   CALCIUM 8.6 01/13/2016   GFR 91.80 01/13/2016   Lab Results  Component Value Date   CHOL 160 08/28/2014   Lab  Results  Component Value Date   HDL 93.20 08/28/2014   Lab Results  Component Value Date   LDLCALC 59 08/28/2014   Lab Results  Component Value Date   TRIG 37.0 08/28/2014   Lab Results  Component Value Date   CHOLHDL 2 08/28/2014   No results found for: HGBA1C     Assessment & Plan:   Problem List Items Addressed This Visit    Anemia   Relevant Orders   CBC   Anxiety and depression   Relevant Medications   ALPRAZolam (XANAX) 0.25 MG tablet   Depression with anxiety    Lexapro      Elevated BP    Well controlled, Encouraged heart healthy diet such as the DASH diet and exercise as tolerated.       Insomnia    belsomra worked but Insurance underwriter would not cover, has had success with low dose Alprazolam may continue the same      Menorrhagia with irregular cycle - Primary   Relevant Orders   Comprehensive metabolic panel (Completed)   CBC (Completed)   Overweight    Encouraged DASH diet, decrease po intake and increase exercise as tolerated. Needs 7-8 hours of sleep nightly. Avoid trans fats, eat small, frequent meals every 4-5 hours with lean proteins, complex carbs and healthy fats. Minimize simple carbs, GMO foods.      Vitamin D deficiency    take daily Vitamin D over the counter. If already taking a daily supplement increase by 1000 IU daily and if not start Vitamin D 2000 IU daily.       Relevant Orders   VITAMIN D 25 Hydroxy (Vit-D Deficiency, Fractures) (Completed)    Other Visit Diagnoses     Bruising        Relevant Orders    CBC (Completed)    Low blood potassium        Relevant Orders    CBC    Basic metabolic panel       I am having Ms. Mcintyre start on desogestrel-ethinyl estradiol and b complex vitamins. I am also having her maintain her Cholecalciferol, ibuprofen, Ferrous Fumarate-Folic Acid, mupirocin ointment, ALPRAZolam, omeprazole, and escitalopram.  Meds ordered this encounter  Medications  . ALPRAZolam (XANAX) 0.25 MG tablet    Sig: Take 1 tablet (0.25 mg total) by mouth 2 (two) times daily as needed for anxiety or sleep.    Dispense:  45 tablet    Refill:  3  . omeprazole (PRILOSEC) 20 MG capsule    Sig: Take 1 capsule (20 mg total) by mouth daily.    Dispense:  30 capsule    Refill:  5  . escitalopram (LEXAPRO) 20 MG tablet    Sig: Take 1 tablet (20 mg total) by mouth daily.    Dispense:  30 tablet    Refill:  5  . desogestrel-ethinyl estradiol (KARIVA) 0.15-0.02/0.01 MG (21/5) tablet    Sig: Take 1 tablet by mouth daily.    Dispense:  1 Package    Refill:  5  . b complex vitamins capsule    Sig: Take 1 capsule by mouth daily.    Dispense:  30 capsule    Refill:  [place], MD

## 2016-01-14 LAB — CBC
HCT: 33.6 % — ABNORMAL LOW (ref 36.0–46.0)
Hemoglobin: 10.9 g/dL — ABNORMAL LOW (ref 12.0–15.0)
MCHC: 32.6 g/dL (ref 30.0–36.0)
MCV: 94.4 fl (ref 78.0–100.0)
Platelets: 269 10*3/uL (ref 150.0–400.0)
RBC: 3.56 Mil/uL — ABNORMAL LOW (ref 3.87–5.11)
RDW: 14.2 % (ref 11.5–15.5)
WBC: 7.9 10*3/uL (ref 4.0–10.5)

## 2016-01-14 LAB — COMPREHENSIVE METABOLIC PANEL
ALT: 10 U/L (ref 0–35)
AST: 15 U/L (ref 0–37)
Albumin: 4 g/dL (ref 3.5–5.2)
Alkaline Phosphatase: 61 U/L (ref 39–117)
BUN: 12 mg/dL (ref 6–23)
CO2: 27 mEq/L (ref 19–32)
Calcium: 8.6 mg/dL (ref 8.4–10.5)
Chloride: 104 mEq/L (ref 96–112)
Creatinine, Ser: 0.73 mg/dL (ref 0.40–1.20)
GFR: 91.8 mL/min (ref >60.00)
Glucose, Bld: 128 mg/dL — ABNORMAL HIGH (ref 70–99)
Potassium: 3.4 mEq/L — ABNORMAL LOW (ref 3.5–5.1)
Sodium: 137 mEq/L (ref 135–145)
Total Bilirubin: 0.5 mg/dL (ref 0.2–1.2)
Total Protein: 7 g/dL (ref 6.0–8.3)

## 2016-01-14 LAB — VITAMIN D 25 HYDROXY (VIT D DEFICIENCY, FRACTURES): VITD: 27.35 ng/mL — ABNORMAL LOW (ref 30.00–100.00)

## 2016-01-15 MED ORDER — B COMPLEX VITAMINS PO CAPS: 1.0000 | ORAL_CAPSULE | Freq: Every day | ORAL | Status: DC

## 2016-01-19 ENCOUNTER — Encounter: Payer: Self-pay | Admitting: Family Medicine

## 2016-01-19 DIAGNOSIS — E663 Overweight: Secondary | ICD-10-CM

## 2016-01-19 HISTORY — DX: Overweight: E66.3

## 2016-01-19 NOTE — Assessment & Plan Note (Signed)
take daily Vitamin D over the counter. If already taking a daily supplement increase by 1000 IU daily and if not start Vitamin D 2000 IU daily.

## 2016-01-19 NOTE — Assessment & Plan Note (Signed)
Well controlled, Encouraged heart healthy diet such as the DASH diet and exercise as tolerated.  

## 2016-01-19 NOTE — Assessment & Plan Note (Signed)
belsomra worked but Insurance underwriter would not cover, has had success with low dose Alprazolam may continue the same

## 2016-01-19 NOTE — Assessment & Plan Note (Signed)
Lexapro 

## 2016-01-19 NOTE — Assessment & Plan Note (Signed)
Encouraged DASH diet, decrease po intake and increase exercise as tolerated. Needs 7-8 hours of sleep nightly. Avoid trans fats, eat small, frequent meals every 4-5 hours with lean proteins, complex carbs and healthy fats. Minimize simple carbs, GMO foods. 

## 2016-02-26 ENCOUNTER — Other Ambulatory Visit: Payer: BLUE CROSS/BLUE SHIELD

## 2016-04-23 ENCOUNTER — Encounter: Payer: Self-pay | Admitting: Family Medicine

## 2016-04-23 ENCOUNTER — Ambulatory Visit (INDEPENDENT_AMBULATORY_CARE_PROVIDER_SITE_OTHER): Payer: BLUE CROSS/BLUE SHIELD | Admitting: Family Medicine

## 2016-04-23 VITALS — BP 108/70 | HR 68 | Temp 98.6°F | Ht 67.0 in | Wt 181.4 lb

## 2016-04-23 DIAGNOSIS — Z23 Encounter for immunization: Secondary | ICD-10-CM

## 2016-04-23 DIAGNOSIS — F419 Anxiety disorder, unspecified: Secondary | ICD-10-CM

## 2016-04-23 DIAGNOSIS — F418 Other specified anxiety disorders: Secondary | ICD-10-CM | POA: Diagnosis not present

## 2016-04-23 DIAGNOSIS — D649 Anemia, unspecified: Secondary | ICD-10-CM

## 2016-04-23 DIAGNOSIS — F32A Depression, unspecified: Secondary | ICD-10-CM

## 2016-04-23 DIAGNOSIS — Z0000 Encounter for general adult medical examination without abnormal findings: Secondary | ICD-10-CM

## 2016-04-23 DIAGNOSIS — R739 Hyperglycemia, unspecified: Secondary | ICD-10-CM | POA: Diagnosis not present

## 2016-04-23 DIAGNOSIS — IMO0001 Reserved for inherently not codable concepts without codable children: Secondary | ICD-10-CM

## 2016-04-23 DIAGNOSIS — R030 Elevated blood-pressure reading, without diagnosis of hypertension: Secondary | ICD-10-CM

## 2016-04-23 DIAGNOSIS — E559 Vitamin D deficiency, unspecified: Secondary | ICD-10-CM | POA: Diagnosis not present

## 2016-04-23 DIAGNOSIS — F329 Major depressive disorder, single episode, unspecified: Secondary | ICD-10-CM

## 2016-04-23 DIAGNOSIS — G4700 Insomnia, unspecified: Secondary | ICD-10-CM

## 2016-04-23 LAB — LIPID PANEL
Cholesterol: 151 mg/dL (ref 0–200)
HDL: 82.8 mg/dL (ref >39.00)
LDL Cholesterol: 58 mg/dL (ref 0–99)
NonHDL: 68.55
Total CHOL/HDL Ratio: 2
Triglycerides: 54 mg/dL (ref 0.0–149.0)
VLDL: 10.8 mg/dL (ref 0.0–40.0)

## 2016-04-23 LAB — CBC
HCT: 31.8 % — ABNORMAL LOW (ref 36.0–46.0)
Hemoglobin: 10.7 g/dL — ABNORMAL LOW (ref 12.0–15.0)
MCHC: 33.7 g/dL (ref 30.0–36.0)
MCV: 91.3 fl (ref 78.0–100.0)
Platelets: 309 10*3/uL (ref 150.0–400.0)
RBC: 3.48 Mil/uL — ABNORMAL LOW (ref 3.87–5.11)
RDW: 14 % (ref 11.5–15.5)
WBC: 5.8 10*3/uL (ref 4.0–10.5)

## 2016-04-23 LAB — VITAMIN D 25 HYDROXY (VIT D DEFICIENCY, FRACTURES): VITD: 31.32 ng/mL (ref 30.00–100.00)

## 2016-04-23 LAB — COMPREHENSIVE METABOLIC PANEL
ALT: 12 U/L (ref 0–35)
AST: 15 U/L (ref 0–37)
Albumin: 4.1 g/dL (ref 3.5–5.2)
Alkaline Phosphatase: 47 U/L (ref 39–117)
BUN: 11 mg/dL (ref 6–23)
CO2: 26 mEq/L (ref 19–32)
Calcium: 9.2 mg/dL (ref 8.4–10.5)
Chloride: 105 mEq/L (ref 96–112)
Creatinine, Ser: 0.81 mg/dL (ref 0.40–1.20)
GFR: 81.32 mL/min (ref >60.00)
Glucose, Bld: 95 mg/dL (ref 70–99)
Potassium: 4.6 mEq/L (ref 3.5–5.1)
Sodium: 137 mEq/L (ref 135–145)
Total Bilirubin: 0.5 mg/dL (ref 0.2–1.2)
Total Protein: 7.4 g/dL (ref 6.0–8.3)

## 2016-04-23 LAB — HEMOGLOBIN A1C: Hgb A1c MFr Bld: 5.4 % (ref 4.6–6.5)

## 2016-04-23 LAB — TSH: TSH: 2 u[IU]/mL (ref 0.35–4.50)

## 2016-04-23 MED ORDER — ZOLPIDEM TARTRATE 10 MG PO TABS: 10.0000 mg | ORAL_TABLET | Freq: Every evening | ORAL | Status: DC | PRN

## 2016-04-23 MED ORDER — OMEPRAZOLE 20 MG PO CPDR: 20.0000 mg | DELAYED_RELEASE_CAPSULE | Freq: Every day | ORAL | Status: DC

## 2016-04-23 MED ORDER — DESOGESTREL-ETHINYL ESTRADIOL 0.15-0.02/0.01 MG (21/5) PO TABS: 1.0000 | ORAL_TABLET | Freq: Every day | ORAL | Status: DC

## 2016-04-23 MED ORDER — ESCITALOPRAM OXALATE 20 MG PO TABS: 20.0000 mg | ORAL_TABLET | Freq: Every day | ORAL | Status: DC

## 2016-04-23 MED ORDER — ALPRAZOLAM 0.25 MG PO TABS: 0.2500 mg | ORAL_TABLET | Freq: Two times a day (BID) | ORAL | Status: DC | PRN

## 2016-04-23 NOTE — Assessment & Plan Note (Signed)
Well controlled. Encouraged heart healthy diet such as the DASH diet and exercise as tolerated.  

## 2016-04-23 NOTE — Assessment & Plan Note (Signed)
Will allow Ambien prn that has helped most in past.

## 2016-04-23 NOTE — Progress Notes (Signed)
Subjective:    Patient ID: Samantha Morales, female    DOB: 02/02/1971, 45 y.o.   MRN: [id number]  Chief Complaint  Patient presents with  . Follow-up    HPI Patient is in today for follow up.  Patient reports she is doing well has been exercising regularly.  Patient reports the alprazolam helps in the evening with sleep.  No recent illness or acute concerns. Denies CP/palp/SOB/HA/congestion/fevers/GI or GU c/o. Taking meds as prescribed   Past Medical History  Diagnosis Date  . RESTLESS LEG SYNDROME, HX OF 01/20/2011  . LACERATION OF FINGER 01/27/2011  . GERD 01/20/2011  . DEPRESSION 01/20/2011  . CHICKENPOX, HX OF 01/20/2011  . ANEMIA, HX OF 01/20/2011  . Tobacco abuse 05/12/2011  . Cellulitis of ankle 05/12/2011  . Atypical chest pain 04/15/2012  . Elevated BP 04/15/2012  . Sacroiliac joint pain 07/22/2012  . Sinusitis, acute 12/12/2012  . Insomnia 12/12/2012  . Back pain 07/22/2012  . Preventative health care 02/28/2014  . Tobacco abuse, in remission 05/12/2011  . Anemia 03/13/2015  . Overweight 01/19/2016  . Hyperglycemia 05/10/2016    Past Surgical History  Procedure Laterality Date  . Lasik eye surgery x b/l      Family History  Problem Relation Age of Onset  . Lupus Sister   . Hypertension Brother   . Cancer Maternal Grandmother     breast  . Alzheimer's disease Maternal Grandmother   . Aneurysm Maternal Grandfather     brain  . Arthritis Maternal Grandfather     rheumatoid  . Nephrolithiasis Paternal Grandmother   . Heart attack Paternal Grandfather   . Anemia Brother   . Hypertension Father     Social History   Social History  . Marital Status: Married    Spouse Name: N/A  . Number of Children: N/A  . Years of Education: N/A   Occupational History  . Not on file.   Social History Main Topics  . Smoking status: Former Smoker -- 0.50 packs/day    Quit date: 04/12/2012  . Smokeless tobacco: Never Used  . Alcohol Use: 0.0 oz/week    0 drink(s) per week       Comment: 6-7 a week  . Drug Use: No  . Sexual Activity:    Partners: Male     Comment: no dietary, lives with husband, wears seatbelt   Other Topics Concern  . Not on file   Social History Narrative    Outpatient Prescriptions Prior to Visit  Medication Sig Dispense Refill  . b complex vitamins capsule Take 1 capsule by mouth daily. 30 capsule 3  . Cholecalciferol (EQL VITAMIN D3) 1000 UNITS tablet Take 1,000 Units by mouth daily.      . Ferrous Fumarate-Folic Acid 99991111 MG TABS Take 1 tablet by mouth daily. 30 each 2  . ibuprofen (ADVIL,MOTRIN) 200 MG tablet Take 200 mg by mouth every 6 (six) hours as needed.    . ALPRAZolam (XANAX) 0.25 MG tablet Take 1 tablet (0.25 mg total) by mouth 2 (two) times daily as needed for anxiety or sleep. 45 tablet 3  . desogestrel-ethinyl estradiol (KARIVA) 0.15-0.02/0.01 MG (21/5) tablet Take 1 tablet by mouth daily. 1 Package 5  . escitalopram (LEXAPRO) 20 MG tablet Take 1 tablet (20 mg total) by mouth daily. 30 tablet 5  . omeprazole (PRILOSEC) 20 MG capsule Take 1 capsule (20 mg total) by mouth daily. 30 capsule 5  . mupirocin ointment (BACTROBAN) 2 % Apply 1 application  topically daily. Apply to lesion on face and in nose at bedtime daily (Patient not taking: Reported on 04/23/2016) 22 g 2   No facility-administered medications prior to visit.    Allergies  Allergen Reactions  . Erythromycin     REACTION: rash  . Penicillins     REACTION: swelling, rash  . Sulfonamide Derivatives     REACTION: GI upset    Review of Systems  Constitutional: Negative for fever and malaise/fatigue.  HENT: Negative for congestion.   Eyes: Negative for blurred vision.  Respiratory: Negative for shortness of breath.   Cardiovascular: Negative for chest pain, palpitations and leg swelling.  Gastrointestinal: Negative for nausea, abdominal pain and blood in stool.  Genitourinary: Negative for dysuria and frequency.  Musculoskeletal: Negative for falls.   Skin: Negative for rash.  Neurological: Negative for dizziness, loss of consciousness and headaches.  Endo/Heme/Allergies: Negative for environmental allergies.  Psychiatric/Behavioral: Negative for depression. The patient is not nervous/anxious.        Objective:    Physical Exam  Constitutional: She is oriented to person, place, and time. She appears well-developed and well-nourished. No distress.  HENT:  Head: Normocephalic and atraumatic.  Eyes: Conjunctivae are normal.  Neck: Neck supple. No thyromegaly present.  Cardiovascular: Normal rate, regular rhythm and normal heart sounds.   No murmur heard. Pulmonary/Chest: Effort normal and breath sounds normal. No respiratory distress.  Abdominal: Soft. Bowel sounds are normal. She exhibits no distension and no mass. There is no tenderness.  Musculoskeletal: She exhibits no edema.  Lymphadenopathy:    She has no cervical adenopathy.  Neurological: She is alert and oriented to person, place, and time.  Skin: Skin is warm and dry.  Psychiatric: She has a normal mood and affect. Her behavior is normal.    BP 108/70 mmHg  Pulse 68  Temp(Src) 98.6 F (37 C) (Oral)  Ht 5\' 7"  (1.702 m)  Wt 181 lb 6 oz (82.271 kg)  BMI 28.40 kg/m2  SpO2 96% Wt Readings from Last 3 Encounters:  04/23/16 181 lb 6 oz (82.271 kg)  01/13/16 183 lb (83.008 kg)  12/05/15 182 lb 4 oz (82.668 kg)     Lab Results  Component Value Date   WBC 5.8 04/23/2016   HGB 10.7* 04/23/2016   HCT 31.8* 04/23/2016   PLT 309.0 04/23/2016   GLUCOSE 95 04/23/2016   CHOL 151 04/23/2016   TRIG 54.0 04/23/2016   HDL 82.80 04/23/2016   LDLCALC 58 04/23/2016   ALT 12 04/23/2016   AST 15 04/23/2016   NA 137 04/23/2016   K 4.6 04/23/2016   CL 105 04/23/2016   CREATININE 0.81 04/23/2016   BUN 11 04/23/2016   CO2 26 04/23/2016   TSH 2.00 04/23/2016   INR 1.0 12/05/2015   HGBA1C 5.4 04/23/2016    Lab Results  Component Value Date   TSH 2.00 04/23/2016   Lab  Results  Component Value Date   WBC 5.8 04/23/2016   HGB 10.7* 04/23/2016   HCT 31.8* 04/23/2016   MCV 91.3 04/23/2016   PLT 309.0 04/23/2016   Lab Results  Component Value Date   NA 137 04/23/2016   K 4.6 04/23/2016   CO2 26 04/23/2016   GLUCOSE 95 04/23/2016   BUN 11 04/23/2016   CREATININE 0.81 04/23/2016   BILITOT 0.5 04/23/2016   ALKPHOS 47 04/23/2016   AST 15 04/23/2016   ALT 12 04/23/2016   PROT 7.4 04/23/2016   ALBUMIN 4.1 04/23/2016   CALCIUM 9.2  04/23/2016   GFR 81.32 04/23/2016   Lab Results  Component Value Date   CHOL 151 04/23/2016   Lab Results  Component Value Date   HDL 82.80 04/23/2016   Lab Results  Component Value Date   LDLCALC 58 04/23/2016   Lab Results  Component Value Date   TRIG 54.0 04/23/2016   Lab Results  Component Value Date   CHOLHDL 2 04/23/2016   Lab Results  Component Value Date   HGBA1C 5.4 04/23/2016       Assessment & Plan:   Problem List Items Addressed This Visit    Vitamin D deficiency    Continue supplements      Relevant Medications   ALPRAZolam (XANAX) 0.25 MG tablet   Other Relevant Orders   CBC (Completed)   TSH (Completed)   Lipid panel (Completed)   Comprehensive metabolic panel (Completed)   Hemoglobin A1c (Completed)   Vitamin D (25 hydroxy) (Completed)   Preventative health care   Relevant Medications   ALPRAZolam (XANAX) 0.25 MG tablet   Other Relevant Orders   CBC (Completed)   TSH (Completed)   Lipid panel (Completed)   Comprehensive metabolic panel (Completed)   Hemoglobin A1c (Completed)   Vitamin D (25 hydroxy) (Completed)   Insomnia    Will allow Ambien prn that has helped most in past.      Hyperglycemia    hgba1c acceptable, minimize simple carbs. Increase exercise as tolerated.      Elevated BP    Well controlled. Encouraged heart healthy diet such as the DASH diet and exercise as tolerated.       Relevant Medications   ALPRAZolam (XANAX) 0.25 MG tablet   Other  Relevant Orders   CBC (Completed)   TSH (Completed)   Lipid panel (Completed)   Comprehensive metabolic panel (Completed)   Hemoglobin A1c (Completed)   Vitamin D (25 hydroxy) (Completed)   Anxiety and depression - Primary   Relevant Medications   ALPRAZolam (XANAX) 0.25 MG tablet   Other Relevant Orders   CBC (Completed)   TSH (Completed)   Lipid panel (Completed)   Comprehensive metabolic panel (Completed)   Hemoglobin A1c (Completed)   Vitamin D (25 hydroxy) (Completed)   Anemia   Relevant Medications   ALPRAZolam (XANAX) 0.25 MG tablet   Other Relevant Orders   CBC (Completed)   TSH (Completed)   Lipid panel (Completed)   Comprehensive metabolic panel (Completed)   Hemoglobin A1c (Completed)   Vitamin D (25 hydroxy) (Completed)    Other Visit Diagnoses    Need for diphtheria-tetanus-pertussis (Tdap) vaccine, adult/adolescent        Relevant Orders    Tdap vaccine greater than or equal to 7yo IM (Completed)       I have discontinued Ms. Racy mupirocin ointment. I am also having her start on zolpidem. Additionally, I am having her maintain her Cholecalciferol, ibuprofen, Ferrous Fumarate-Folic Acid, b complex vitamins, desogestrel-ethinyl estradiol, escitalopram, omeprazole, and ALPRAZolam.  Meds ordered this encounter  Medications  . desogestrel-ethinyl estradiol (KARIVA) 0.15-0.02/0.01 MG (21/5) tablet    Sig: Take 1 tablet by mouth daily.    Dispense:  1 Package    Refill:  5  . escitalopram (LEXAPRO) 20 MG tablet    Sig: Take 1 tablet (20 mg total) by mouth daily.    Dispense:  30 tablet    Refill:  5  . omeprazole (PRILOSEC) 20 MG capsule    Sig: Take 1 capsule (20 mg total) by mouth daily.  Dispense:  30 capsule    Refill:  5  . ALPRAZolam (XANAX) 0.25 MG tablet    Sig: Take 1 tablet (0.25 mg total) by mouth 2 (two) times daily as needed for anxiety or sleep.    Dispense:  45 tablet    Refill:  3  . zolpidem (AMBIEN) 10 MG tablet    Sig: Take 1  tablet (10 mg total) by mouth at bedtime as needed for sleep.    Dispense:  30 tablet    Refill:  2     Penni Homans, MD

## 2016-04-23 NOTE — Progress Notes (Signed)
Pre visit review using our clinic review tool, if applicable. No additional management support is needed unless otherwise documented below in the visit note. 

## 2016-04-23 NOTE — Patient Instructions (Signed)
Insomnia Insomnia is a sleep disorder that makes it difficult to fall asleep or to stay asleep. Insomnia can cause tiredness (fatigue), low energy, difficulty concentrating, mood swings, and poor performance at work or school.  There are three different ways to classify insomnia:  Difficulty falling asleep.  Difficulty staying asleep.  Waking up too early in the morning. Any type of insomnia can be long-term (chronic) or short-term (acute). Both are common. Short-term insomnia usually lasts for three months or less. Chronic insomnia occurs at least three times a week for longer than three months. CAUSES  Insomnia may be caused by another condition, situation, or substance, such as:  Anxiety.  Certain medicines.  Gastroesophageal reflux disease (GERD) or other gastrointestinal conditions.  Asthma or other breathing conditions.  Restless legs syndrome, sleep apnea, or other sleep disorders.  Chronic pain.  Menopause. This may include hot flashes.  Stroke.  Abuse of alcohol, tobacco, or illegal drugs.  Depression.  Caffeine.   Neurological disorders, such as Alzheimer disease.  An overactive thyroid (hyperthyroidism). The cause of insomnia may not be known. RISK FACTORS Risk factors for insomnia include:  Gender. Women are more commonly affected than men.  Age. Insomnia is more common as you get older.  Stress. This may involve your professional or personal life.  Income. Insomnia is more common in people with lower income.  Lack of exercise.   Irregular work schedule or night shifts.  Traveling between different time zones. SIGNS AND SYMPTOMS If you have insomnia, trouble falling asleep or trouble staying asleep is the main symptom. This may lead to other symptoms, such as:  Feeling fatigued.  Feeling nervous about going to sleep.  Not feeling rested in the morning.  Having trouble concentrating.  Feeling irritable, anxious, or depressed. TREATMENT   Treatment for insomnia depends on the cause. If your insomnia is caused by an underlying condition, treatment will focus on addressing the condition. Treatment may also include:   Medicines to help you sleep.  Counseling or therapy.  Lifestyle adjustments. HOME CARE INSTRUCTIONS   Take medicines only as directed by your health care provider.  Keep regular sleeping and waking hours. Avoid naps.  Keep a sleep diary to help you and your health care provider figure out what could be causing your insomnia. Include:   When you sleep.  When you wake up during the night.  How well you sleep.   How rested you feel the next day.  Any side effects of medicines you are taking.  What you eat and drink.   Make your bedroom a comfortable place where it is easy to fall asleep:  Put up shades or special blackout curtains to block light from outside.  Use a white noise machine to block noise.  Keep the temperature cool.   Exercise regularly as directed by your health care provider. Avoid exercising right before bedtime.  Use relaxation techniques to manage stress. Ask your health care provider to suggest some techniques that may work well for you. These may include:  Breathing exercises.  Routines to release muscle tension.  Visualizing peaceful scenes.  Cut back on alcohol, caffeinated beverages, and cigarettes, especially close to bedtime. These can disrupt your sleep.  Do not overeat or eat spicy foods right before bedtime. This can lead to digestive discomfort that can make it hard for you to sleep.  Limit screen use before bedtime. This includes:  Watching TV.  Using your smartphone, tablet, and computer.  Stick to a routine. This   can help you fall asleep faster. Try to do a quiet activity, brush your teeth, and go to bed at the same time each night.  Get out of bed if you are still awake after 15 minutes of trying to sleep. Keep the lights down, but try reading or  doing a quiet activity. When you feel sleepy, go back to bed.  Make sure that you drive carefully. Avoid driving if you feel very sleepy.  Keep all follow-up appointments as directed by your health care provider. This is important. SEEK MEDICAL CARE IF:   You are tired throughout the day or have trouble in your daily routine due to sleepiness.  You continue to have sleep problems or your sleep problems get worse. SEEK IMMEDIATE MEDICAL CARE IF:   You have serious thoughts about hurting yourself or someone else.   This information is not intended to replace advice given to you by your health care provider. Make sure you discuss any questions you have with your health care provider.   Document Released: 12/11/2000 Document Revised: 09/04/2015 Document Reviewed: 09/14/2014 Elsevier Interactive Patient Education 2016 [employer]  

## 2016-05-10 ENCOUNTER — Encounter: Payer: Self-pay | Admitting: Family Medicine

## 2016-05-10 DIAGNOSIS — R739 Hyperglycemia, unspecified: Secondary | ICD-10-CM

## 2016-05-10 HISTORY — DX: Hyperglycemia, unspecified: R73.9

## 2016-05-10 NOTE — Assessment & Plan Note (Signed)
hgba1c acceptable, minimize simple carbs. Increase exercise as tolerated.  

## 2016-05-10 NOTE — Assessment & Plan Note (Signed)
Continue supplements

## 2016-07-24 ENCOUNTER — Ambulatory Visit: Payer: BLUE CROSS/BLUE SHIELD | Admitting: Family Medicine

## 2016-09-17 ENCOUNTER — Ambulatory Visit (INDEPENDENT_AMBULATORY_CARE_PROVIDER_SITE_OTHER): Payer: BLUE CROSS/BLUE SHIELD | Admitting: Medical

## 2016-09-17 ENCOUNTER — Telehealth: Payer: Self-pay | Admitting: Medical

## 2016-09-17 VITALS — BP 134/81 | HR 72 | Temp 98.0°F | Ht 67.0 in | Wt 185.6 lb

## 2016-09-17 DIAGNOSIS — L089 Local infection of the skin and subcutaneous tissue, unspecified: Secondary | ICD-10-CM

## 2016-09-17 MED ORDER — DOXYCYCLINE HYCLATE 100 MG PO TABS: 100.0000 mg | ORAL_TABLET | Freq: Two times a day (BID) | ORAL | 0 refills | Status: DC

## 2016-09-17 MED ORDER — HYDROCODONE-ACETAMINOPHEN 5-325 MG PO TABS
1.0000 | ORAL_TABLET | Freq: Four times a day (QID) | ORAL | 0 refills | Status: DC | PRN
Start: 2016-09-17 — End: 2016-10-26

## 2016-09-17 NOTE — Progress Notes (Addendum)
Subjective:    Patient ID: Samantha Morales, female    DOB: 05/28/1971, 45 y.o.   MRN: [id number]  HPI Pt in for evaluation. Pt states she has gradual redness on rt thumb since tuesday. Then this morning has gotten a lot of redness  and swelling. Feels tight and tender.  This happened before 2-3 months ago briefly and then resolved without any procedure.  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   LMP-started yesterday. Came at expected. Pt is on ocp.     Review of Systems  Constitutional: Negative for chills, fatigue and fever.  Respiratory: Negative for cough, chest tightness and wheezing.   Cardiovascular: Negative for chest pain and palpitations.  Musculoskeletal:       Rt thumb red and tendness. See PE.  Skin:       Redness to thumb. See PE.  Neurological: Negative for dizziness, numbness and headaches.  Hematological: Negative for adenopathy. Does not bruise/bleed easily.    Past Medical History:  Diagnosis Date  . Anemia 03/13/2015  . ANEMIA, HX OF 01/20/2011  . Atypical chest pain 04/15/2012  . Back pain 07/22/2012  . Cellulitis of ankle 05/12/2011  . CHICKENPOX, HX OF 01/20/2011  . DEPRESSION 01/20/2011  . Elevated BP 04/15/2012  . GERD 01/20/2011  . Hyperglycemia 05/10/2016  . Insomnia 12/12/2012  . LACERATION OF FINGER 01/27/2011  . Overweight 01/19/2016  . Preventative health care 02/28/2014  . RESTLESS LEG SYNDROME, HX OF 01/20/2011  . Sacroiliac joint pain 07/22/2012  . Sinusitis, acute 12/12/2012  . Tobacco abuse 05/12/2011  . 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 Marital status: Married    Spouse name: N/A  . Number of children: N/A  . Years of education: N/A   Occupational History  . Not on file.   Social History Main Topics  . Smoking status: Former Smoker    Packs/day: 0.50    Quit date: 04/12/2012  . Smokeless tobacco: Never Used  . Alcohol use 0.0 oz/week    0 drink(s) per week     Comment: 6-7 a week  . Drug  use: No  . Sexual activity: Yes    Partners: Male     Comment: no dietary, lives with husband, wears seatbelt   Other Topics Concern  . Not on file   Social History Narrative  . No narrative on file    Past Surgical History:  Procedure Laterality Date  . lasik eye surgery X b/l      Family History  Problem Relation Age of Onset  . Lupus Sister   . Hypertension Brother   . Cancer Maternal Grandmother     breast  . Alzheimer's disease Maternal Grandmother   . Aneurysm Maternal Grandfather     brain  . Arthritis Maternal Grandfather     rheumatoid  . Nephrolithiasis Paternal Grandmother   . Heart attack Paternal Grandfather   . Anemia Brother   . Hypertension Father     Allergies  Allergen Reactions  . Erythromycin     REACTION: rash  . Penicillins     REACTION: swelling, rash  . Sulfonamide Derivatives     REACTION: GI upset    Current Outpatient Prescriptions on File Prior to Visit  Medication Sig Dispense Refill  . ALPRAZolam (XANAX) 0.25 MG tablet Take 1 tablet (0.25 mg total) by mouth 2 (two) times daily as needed for anxiety or sleep. 45 tablet 3  .  b complex vitamins capsule Take 1 capsule by mouth daily. 30 capsule 3  . Cholecalciferol (EQL VITAMIN D3) 1000 UNITS tablet Take 1,000 Units by mouth daily.      Marland Kitchen desogestrel-ethinyl estradiol (KARIVA) 0.15-0.02/0.01 MG (21/5) tablet Take 1 tablet by mouth daily. 1 Package 5  . escitalopram (LEXAPRO) 20 MG tablet Take 1 tablet (20 mg total) by mouth daily. 30 tablet 5  . Ferrous Fumarate-Folic Acid 99991111 MG TABS Take 1 tablet by mouth daily. 30 each 2  . ibuprofen (ADVIL,MOTRIN) 200 MG tablet Take 200 mg by mouth every 6 (six) hours as needed.    Marland Kitchen omeprazole (PRILOSEC) 20 MG capsule Take 1 capsule (20 mg total) by mouth daily. 30 capsule 5   No current facility-administered medications on file prior to visit.     BP 134/81   Pulse 72   Temp 98 F (36.7 C) (Oral)   Ht 5\' 7"  (1.702 m)   Wt 185 lb 9.6 oz  (84.2 kg)   LMP 09/17/2016   SpO2 99%   BMI 29.07 kg/m       Objective:   Physical Exam  General- No acute distress. Pleasant patient.  Rt thumb- lateral aspect edge of nail moderate- red and tender( 1/4 of tip). Yellowish color to edge of nail. Partial flexion. Faint redness up to dip.   Rt upper ext- no tracking and no lymphadenopathy.       Assessment & Plan:  You appear to have rt thumb infection with probable abscess.  Since this is your  thumb on dominant hand. Also  we approaching the  weekend, I want to get you in with hand surgeon  You can do warm salt water soaks twice a day. Try motrin for pain. But if your pain is worsening then start norco.  Will rx doxycycline antibiotic.   Follow up with [place] as needed I am putting referral to hand surgeon tonight. I want you to call her by 9am and speak with Marita Kansas or Anderson Malta if no one has called you by 9am  Unable to refer to hand surgeon. All too busy. No available.  Pre procdure vitals on day 09-18-2016 bp 120/90, pulse 74, temp 98.3 and O2 sat 99%. Attempts to refer pt this am to hand specialist failed. Pt was on the way to ED. I talked with Dr. Charlett Blake advised pt to come by our office.   Pt signed consent form benefit and risk explained. At this point explained benefit exceeds any risk in light of worsened infection and abscess now apparent.   Sterile drapped area. bedadine applied. Sprayed area with pain easy. Injected both side of thumb at base with 2% lidocaine. Anesthesia achieved. Incised with 15 blade. Expressed discharge out. Got culture of wound.(post procedure bleeding stopped. Tissue looks bruised at base of nail post treatment.  Web designer cleansed with saline and dress wound.    Follow up this coming in Monday or as needed. Any tracking or redness up arm then ED evaluation.  Changed clindamycin. Stop doxycycline      Tajay Muzzy, Percell Miller, PA-C

## 2016-09-17 NOTE — Telephone Encounter (Signed)
Will you see hand surgeon referral. Get her in by tomorrow.

## 2016-09-17 NOTE — Patient Instructions (Addendum)
You appear to have rt thumb infection with probable abscess.  Since this is your  thumb on dominant hand. Also  we approaching the  weekend, I want to get you in with hand surgeon  You can do warm salt water soaks twice a day. Try motrin for pain. But if your pain is worsening then start norco.  Will rx doxycycline antibiotic.  Follow up with [place] as needed I am putting referral to hand surgeon tonight. I want you to call her by 9am and speak with Marita Kansas or Anderson Malta if no one has called you by 9am    Fill pain med use if needed.  If area worsens over weekend which should not be the case then ED evaluation.  Unable to refer so procedure I and D done in office.  Changed clindamycin. Stop doxycycline. Get norco rx.  Redress wound daily. Do warm salt water soaks daily. Redress wound after cleaning daily.   Follow up on Monday. Work note until Tuesday.

## 2016-09-18 ENCOUNTER — Encounter: Payer: Self-pay | Admitting: Medical

## 2016-09-18 ENCOUNTER — Encounter (INDEPENDENT_AMBULATORY_CARE_PROVIDER_SITE_OTHER): Payer: BLUE CROSS/BLUE SHIELD | Admitting: Medical

## 2016-09-18 DIAGNOSIS — S61219A Laceration without foreign body of unspecified finger without damage to nail, initial encounter: Secondary | ICD-10-CM

## 2016-09-18 DIAGNOSIS — L089 Local infection of the skin and subcutaneous tissue, unspecified: Secondary | ICD-10-CM | POA: Diagnosis not present

## 2016-09-18 MED ORDER — KETOROLAC TROMETHAMINE 60 MG/2ML IM SOLN
60.0000 mg | Freq: Once | INTRAMUSCULAR | Status: AC
  Administered 2016-09-18: 60 mg via INTRAMUSCULAR

## 2016-09-18 MED ORDER — CLINDAMYCIN HCL 150 MG PO CAPS: 150.0000 mg | ORAL_CAPSULE | Freq: Three times a day (TID) | ORAL | 0 refills | Status: DC

## 2016-09-18 MED ORDER — FLUCONAZOLE 150 MG PO TABS: 150.0000 mg | ORAL_TABLET | Freq: Once | ORAL | 1 refills | Status: AC

## 2016-09-18 NOTE — Addendum Note (Signed)
Addended by: Anabel Halon on: 09/18/2016 12:15 PM   Modules accepted: Orders

## 2016-09-18 NOTE — Progress Notes (Signed)
This encounter was created in error - please disregard.

## 2016-09-18 NOTE — Addendum Note (Signed)
Addended by: Anabel Halon on: 09/18/2016 12:58 PM   Modules accepted: Orders

## 2016-09-18 NOTE — Addendum Note (Signed)
Addended by: Anabel Halon on: 09/18/2016 12:43 PM   Modules accepted: Orders

## 2016-09-18 NOTE — Progress Notes (Signed)
Pre visit review using our clinic tool,if applicable. No additional management support is needed unless otherwise documented below in the visit note.  

## 2016-09-19 NOTE — Telephone Encounter (Signed)
I called pt today at 9 am to see how she was doing. Her thumb feels better. Now faint pain 1-2 level at most but  before was severe described 9-10 level pain. Pt not reporting redness to area. She is only taking ibuprofen for pain now. Not requiring norco. Pt will follow up on Monday.

## 2016-09-21 ENCOUNTER — Ambulatory Visit (HOSPITAL_BASED_OUTPATIENT_CLINIC_OR_DEPARTMENT_OTHER)
Admission: RE | Admit: 2016-09-21 | Discharge: 2016-09-21 | Disposition: A | Discharge status: Home / Self Care | Payer: BLUE CROSS/BLUE SHIELD | Source: Ambulatory Visit | Attending: Medical | Admitting: Medical

## 2016-09-21 ENCOUNTER — Ambulatory Visit (INDEPENDENT_AMBULATORY_CARE_PROVIDER_SITE_OTHER): Payer: BLUE CROSS/BLUE SHIELD | Admitting: Medical

## 2016-09-21 ENCOUNTER — Telehealth: Payer: Self-pay | Admitting: Medical

## 2016-09-21 VITALS — HR 70 | Temp 98.6°F | Ht 67.0 in | Wt 182.0 lb

## 2016-09-21 DIAGNOSIS — M79644 Pain in right finger(s): Secondary | ICD-10-CM | POA: Insufficient documentation

## 2016-09-21 DIAGNOSIS — L089 Local infection of the skin and subcutaneous tissue, unspecified: Secondary | ICD-10-CM

## 2016-09-21 NOTE — Progress Notes (Signed)
Subjective:    Patient ID: Samantha Morales, female    DOB: Nov 27, 1971, 45 y.o.   MRN: [id number]  HPI  Pt in for follow up. Pt pain level is 5/10. Pain level was 10/10 prior to I and D.   Pt with dressing had level 5/10 pain. But when gauze removed pain level minimal 2/10.  I tried to get patient in with hand surgeon. All attempts failed on Friday. Pt has had no fevers, no chills or sweats.   Pt has not taken any ibuprofen today. Last took last night.   Review of Systems  Constitutional: Negative for chills, fatigue and fever.  Musculoskeletal:       Rt thumb pain.  Psychiatric/Behavioral: Negative for behavioral problems and confusion.   Past Medical History:  Diagnosis Date  . Anemia 03/13/2015  . ANEMIA, HX OF 01/20/2011  . Atypical chest pain 04/15/2012  . Back pain 07/22/2012  . Cellulitis of ankle 05/12/2011  . CHICKENPOX, HX OF 01/20/2011  . DEPRESSION 01/20/2011  . Elevated BP 04/15/2012  . GERD 01/20/2011  . Hyperglycemia 05/10/2016  . Insomnia 12/12/2012  . LACERATION OF FINGER 01/27/2011  . Overweight 01/19/2016  . Preventative health care 02/28/2014  . RESTLESS LEG SYNDROME, HX OF 01/20/2011  . Sacroiliac joint pain 07/22/2012  . Sinusitis, acute 12/12/2012  . Tobacco abuse 05/12/2011  . 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 Marital status: Married    Spouse name: N/A  . Number of children: N/A  . Years of education: N/A   Occupational History  . Not on file.   Social History Main Topics  . Smoking status: Former Smoker    Packs/day: 0.50    Quit date: 04/12/2012  . Smokeless tobacco: Never Used  . Alcohol use 0.0 oz/week    0 drink(s) per week     Comment: 6-7 a week  . Drug use: No  . Sexual activity: Yes    Partners: Male     Comment: no dietary, lives with husband, wears seatbelt   Other Topics Concern  . Not on file   Social History Narrative  . No narrative on file    Past Surgical History:    Procedure Laterality Date  . lasik eye surgery X b/l      Family History  Problem Relation Age of Onset  . Lupus Sister   . Hypertension Brother   . Cancer Maternal Grandmother     breast  . Alzheimer's disease Maternal Grandmother   . Aneurysm Maternal Grandfather     brain  . Arthritis Maternal Grandfather     rheumatoid  . Nephrolithiasis Paternal Grandmother   . Heart attack Paternal Grandfather   . Anemia Brother   . Hypertension Father     Allergies  Allergen Reactions  . Erythromycin     REACTION: rash  . Penicillins     REACTION: swelling, rash  . Sulfonamide Derivatives     REACTION: GI upset    Current Outpatient Prescriptions on File Prior to Visit  Medication Sig Dispense Refill  . ALPRAZolam (XANAX) 0.25 MG tablet Take 1 tablet (0.25 mg total) by mouth 2 (two) times daily as needed for anxiety or sleep. 45 tablet 3  . b complex vitamins capsule Take 1 capsule by mouth daily. 30 capsule 3  . Cholecalciferol (EQL VITAMIN D3) 1000 UNITS tablet Take 1,000 Units by mouth daily.      . clindamycin (CLEOCIN)  150 MG capsule Take 1 capsule (150 mg total) by mouth 3 (three) times daily. 21 capsule 0  . desogestrel-ethinyl estradiol (KARIVA) 0.15-0.02/0.01 MG (21/5) tablet Take 1 tablet by mouth daily. 1 Package 5  . doxycycline (VIBRA-TABS) 100 MG tablet Take 1 tablet (100 mg total) by mouth 2 (two) times daily. 20 tablet 0  . escitalopram (LEXAPRO) 20 MG tablet Take 1 tablet (20 mg total) by mouth daily. 30 tablet 5  . Ferrous Fumarate-Folic Acid 99991111 MG TABS Take 1 tablet by mouth daily. 30 each 2  . HYDROcodone-acetaminophen (NORCO) 5-325 MG tablet Take 1 tablet by mouth every 6 (six) hours as needed for moderate pain. 8 tablet 0  . ibuprofen (ADVIL,MOTRIN) 200 MG tablet Take 200 mg by mouth every 6 (six) hours as needed.    Marland Kitchen omeprazole (PRILOSEC) 20 MG capsule Take 1 capsule (20 mg total) by mouth daily. 30 capsule 5   No current facility-administered  medications on file prior to visit.     Pulse 70   Temp 98.6 F (37 C) (Oral)   Ht 5\' 7"  (1.702 m)   Wt 182 lb (82.6 kg)   LMP 09/17/2016   SpO2 98%   BMI 28.51 kg/m       Objective:   Physical Exam   General- No acute distress. Pleasant patient. Neck- Full range of motion, no jvd Lungs- Clear, even and unlabored. Heart- regular rate and rhythm. Neurologic- CNII- XII grossly intact.  Rt thumb- discharge site of I and D looks good. Healing wound. No re aculamulation of pus. Some swelling to lateral aspect of her thumb. Some faint intermittent pain in this area. Good color. No redness or tracking. Overall thumb pad looks little more swollen than left side.       Assessment & Plan:  Your thumb looks better overall but since pain level still moderate and some decreased flexion I want to get xray to make sure no indication of infection of bone.  Also will try to get you in with hand specialist to make sure no deeper incision or procedure needs to be done.   Follow up date to be determined. Depends on when can get you in with hand specialist. If we don't get you in by Wednesday then would need to see you back then.  Presently continue clindamycin and ibuprofen.  Also get cbc lab to evaluate if wbc elevated.    Campbell Agramonte, Percell Miller, PA-C

## 2016-09-21 NOTE — Telephone Encounter (Signed)
Will you look at referral I placed. Can you get pt scheduled for Tuesday or Wednesday this week. Very important please give me update on 09-22-2016 early in am. Try [facility] first please.

## 2016-09-21 NOTE — Progress Notes (Signed)
Pre visit review using our clinic tool,if applicable. No additional management support is needed unless otherwise documented below in the visit note.  

## 2016-09-21 NOTE — Addendum Note (Signed)
Addended by: Peggyann Shoals on: 09/21/2016 03:33 PM   Modules accepted: Orders

## 2016-09-21 NOTE — Patient Instructions (Addendum)
Your thumb looks better overall but since pain level still moderate and some decreased flexion I want to get xray to make sure no indication of infection of bone.  Also will try to get you in with hand specialist to make sure no deeper incision or procedure needs to be done.   Follow up date to be determined. Depends on when can get you in with hand specialist. If we don't get you in by Wednesday then would need to see you back then.  Presently continue clindamycin and ibuprofen.  Also get cbc lab to evaluate if wbc elevated.

## 2016-09-22 ENCOUNTER — Telehealth: Payer: Self-pay | Admitting: Medical

## 2016-09-22 DIAGNOSIS — L03011 Cellulitis of right finger: Secondary | ICD-10-CM | POA: Diagnosis not present

## 2016-09-22 LAB — CBC WITH DIFFERENTIAL/PLATELET
Basophils Absolute: 0 10*3/uL (ref 0.0–0.1)
Basophils Relative: 0.5 % (ref 0.0–3.0)
Eosinophils Absolute: 0.2 10*3/uL (ref 0.0–0.7)
Eosinophils Relative: 2.3 % (ref 0.0–5.0)
HCT: 38.3 % (ref 36.0–46.0)
Hemoglobin: 12.7 g/dL (ref 12.0–15.0)
Lymphocytes Relative: 25.3 % (ref 12.0–46.0)
Lymphs Abs: 2.1 10*3/uL (ref 0.7–4.0)
MCHC: 33.1 g/dL (ref 30.0–36.0)
MCV: 89.5 fl (ref 78.0–100.0)
Monocytes Absolute: 0.6 10*3/uL (ref 0.1–1.0)
Monocytes Relative: 6.7 % (ref 3.0–12.0)
Neutro Abs: 5.5 10*3/uL (ref 1.4–7.7)
Neutrophils Relative %: 65.2 % (ref 43.0–77.0)
Platelets: 358 10*3/uL (ref 150.0–400.0)
RBC: 4.28 Mil/uL (ref 3.87–5.11)
RDW: 15.4 % (ref 11.5–15.5)
WBC: 8.4 10*3/uL (ref 4.0–10.5)

## 2016-09-22 NOTE — Telephone Encounter (Signed)
Please fax over wound culture report and xray of thumb report along with notes.

## 2016-09-22 NOTE — Telephone Encounter (Signed)
Regarding the hand surgeon referral. I did culture of drainage. Also xray of thumb done yesterday. Will you send the culture and the xray report of thumb.   What is status of the referral.

## 2016-09-22 NOTE — Telephone Encounter (Signed)
Everything was sent to Dr. Caralyn Guile. Pt will be seen 1:45pm today. I notified her of appt.

## 2016-10-06 DIAGNOSIS — J069 Acute upper respiratory infection, unspecified: Secondary | ICD-10-CM | POA: Diagnosis not present

## 2016-10-15 DIAGNOSIS — J0100 Acute maxillary sinusitis, unspecified: Secondary | ICD-10-CM | POA: Diagnosis not present

## 2016-10-15 DIAGNOSIS — B373 Candidiasis of vulva and vagina: Secondary | ICD-10-CM | POA: Diagnosis not present

## 2016-10-15 DIAGNOSIS — R05 Cough: Secondary | ICD-10-CM | POA: Diagnosis not present

## 2016-10-26 ENCOUNTER — Encounter: Payer: Self-pay | Admitting: Family Medicine

## 2016-10-26 ENCOUNTER — Ambulatory Visit (INDEPENDENT_AMBULATORY_CARE_PROVIDER_SITE_OTHER): Payer: BLUE CROSS/BLUE SHIELD | Admitting: Family Medicine

## 2016-10-26 DIAGNOSIS — F419 Anxiety disorder, unspecified: Secondary | ICD-10-CM

## 2016-10-26 DIAGNOSIS — J069 Acute upper respiratory infection, unspecified: Secondary | ICD-10-CM

## 2016-10-26 DIAGNOSIS — F418 Other specified anxiety disorders: Secondary | ICD-10-CM | POA: Diagnosis not present

## 2016-10-26 DIAGNOSIS — G4700 Insomnia, unspecified: Secondary | ICD-10-CM

## 2016-10-26 DIAGNOSIS — E559 Vitamin D deficiency, unspecified: Secondary | ICD-10-CM

## 2016-10-26 DIAGNOSIS — F329 Major depressive disorder, single episode, unspecified: Secondary | ICD-10-CM

## 2016-10-26 DIAGNOSIS — Z0000 Encounter for general adult medical examination without abnormal findings: Secondary | ICD-10-CM

## 2016-10-26 DIAGNOSIS — F32A Depression, unspecified: Secondary | ICD-10-CM

## 2016-10-26 DIAGNOSIS — R739 Hyperglycemia, unspecified: Secondary | ICD-10-CM

## 2016-10-26 DIAGNOSIS — R040 Epistaxis: Secondary | ICD-10-CM

## 2016-10-26 HISTORY — DX: Epistaxis: R04.0

## 2016-10-26 HISTORY — DX: Acute upper respiratory infection, unspecified: J06.9

## 2016-10-26 MED ORDER — MUPIROCIN 2 % EX OINT: 1.0000 "application " | TOPICAL_OINTMENT | Freq: Two times a day (BID) | CUTANEOUS | 0 refills | Status: DC

## 2016-10-26 MED ORDER — DESOGESTREL-ETHINYL ESTRADIOL 0.15-0.02/0.01 MG (21/5) PO TABS: 1.0000 | ORAL_TABLET | Freq: Every day | ORAL | 5 refills | Status: DC

## 2016-10-26 MED ORDER — OMEPRAZOLE 20 MG PO CPDR: 20.0000 mg | DELAYED_RELEASE_CAPSULE | Freq: Every day | ORAL | 5 refills | Status: DC

## 2016-10-26 MED ORDER — ESCITALOPRAM OXALATE 20 MG PO TABS: 20.0000 mg | ORAL_TABLET | Freq: Every day | ORAL | 5 refills | Status: DC

## 2016-10-26 MED ORDER — ZOLPIDEM TARTRATE 10 MG PO TABS: 10.0000 mg | ORAL_TABLET | Freq: Every evening | ORAL | 2 refills | Status: DC | PRN

## 2016-10-26 MED ORDER — ALPRAZOLAM 0.25 MG PO TABS: 0.2500 mg | ORAL_TABLET | Freq: Two times a day (BID) | ORAL | 3 refills | Status: DC | PRN

## 2016-10-26 NOTE — Progress Notes (Signed)
Patient ID: Samantha Morales, female   DOB: [birth year], 45 y.o.   MRN: [id number]   Subjective:    Patient ID: Samantha Morales, female    DOB: 11-Jan-1971, 45 y.o.   MRN: [id number]  Chief Complaint  Patient presents with  . Follow-up    HPI Patient is in today for follow up. She is feeling well today but is recivering from a head cold and feels better after her course of doxycycline. Patient denies any difficulties at home. No trouble with ADLs, depression or falls. See EMR for functional status screen and depression screen. No recent changes to vision or hearing. Is UTD with immunizations. Is UTD with screening. Discussed Advanced Directives. Encouraged heart healthy diet, exercise as tolerated and adequate sleep. See patient's problem list for health risk factors to monitor. See AVS for preventative healthcare recommendation schedule. Does endorse some mild right sided nose bleeds but they are easily controlled. Notes some tdifficulty falling asleep and maintaining sleep at times.   Past Medical History:  Diagnosis Date  . Acute upper respiratory infection 10/26/2016  . Anemia 03/13/2015  . ANEMIA, HX OF 01/20/2011  . Atypical chest pain 04/15/2012  . Back pain 07/22/2012  . Cellulitis of ankle 05/12/2011  . CHICKENPOX, HX OF 01/20/2011  . DEPRESSION 01/20/2011  . Elevated BP 04/15/2012  . Epistaxis 10/26/2016  . GERD 01/20/2011  . Hyperglycemia 05/10/2016  . Insomnia 12/12/2012  . LACERATION OF FINGER 01/27/2011  . Overweight 01/19/2016  . Preventative health care 02/28/2014  . RESTLESS LEG SYNDROME, HX OF 01/20/2011  . Sacroiliac joint pain 07/22/2012  . Sinusitis, acute 12/12/2012  . Tobacco abuse 05/12/2011  . 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 lasik eye surgery X b/l      Family History  Problem Relation Age of Onset  . Lupus Sister   . Hypertension Brother   . Cancer Maternal Grandmother     breast  . Alzheimer's  disease Maternal Grandmother   . Aneurysm Maternal Grandfather     brain  . Arthritis Maternal Grandfather     rheumatoid  . Nephrolithiasis Paternal Grandmother   . Heart attack Paternal Grandfather   . Anemia Brother   . Hypertension Father     Social History   Social History  . Marital status: Married    Spouse name: N/A  . Number of children: N/A  . Years of education: N/A   Occupational History  . Not on file.   Social History Main Topics  . Smoking status: Former Smoker    Packs/day: 0.50    Quit date: 04/12/2012  . Smokeless tobacco: Never Used  . Alcohol use 0.0 oz/week    0 drink(s) per week     Comment: 6-7 a week  . Drug use: No  . Sexual activity: Yes    Partners: Male     Comment: no dietary, lives with husband, wears seatbelt   Other Topics Concern  . Not on file   Social History Narrative  . No narrative on file    Outpatient Medications Prior to Visit  Medication Sig Dispense Refill  . b complex vitamins capsule Take 1 capsule by mouth daily. 30 capsule 3  . Cholecalciferol (EQL VITAMIN D3) 1000 UNITS tablet Take 1,000 Units by mouth daily.      . Ferrous Fumarate-Folic Acid 99991111 MG TABS Take 1 tablet by mouth daily. 30 each 2  . ibuprofen (ADVIL,MOTRIN) 200 MG  tablet Take 200 mg by mouth every 6 (six) hours as needed.    . ALPRAZolam (XANAX) 0.25 MG tablet Take 1 tablet (0.25 mg total) by mouth 2 (two) times daily as needed for anxiety or sleep. 45 tablet 3  . clindamycin (CLEOCIN) 150 MG capsule Take 1 capsule (150 mg total) by mouth 3 (three) times daily. 21 capsule 0  . desogestrel-ethinyl estradiol (KARIVA) 0.15-0.02/0.01 MG (21/5) tablet Take 1 tablet by mouth daily. 1 Package 5  . escitalopram (LEXAPRO) 20 MG tablet Take 1 tablet (20 mg total) by mouth daily. 30 tablet 5  . HYDROcodone-acetaminophen (NORCO) 5-325 MG tablet Take 1 tablet by mouth every 6 (six) hours as needed for moderate pain. 8 tablet 0  . omeprazole (PRILOSEC) 20 MG  capsule Take 1 capsule (20 mg total) by mouth daily. 30 capsule 5  . doxycycline (VIBRA-TABS) 100 MG tablet Take 1 tablet (100 mg total) by mouth 2 (two) times daily. 20 tablet 0   No facility-administered medications prior to visit.     Allergies  Allergen Reactions  . Erythromycin     REACTION: rash  . Penicillins     REACTION: swelling, rash  . Sulfonamide Derivatives     REACTION: GI upset    Review of Systems  Constitutional: Negative for fever and malaise/fatigue.  HENT: Positive for congestion.   Eyes: Negative for blurred vision.  Respiratory: Positive for cough. Negative for shortness of breath.   Cardiovascular: Negative for chest pain, palpitations and leg swelling.  Gastrointestinal: Negative for abdominal pain, blood in stool and nausea.  Genitourinary: Negative for dysuria and frequency.  Musculoskeletal: Negative for falls.  Skin: Negative for rash.  Neurological: Negative for dizziness, loss of consciousness and headaches.  Endo/Heme/Allergies: Negative for environmental allergies.  Psychiatric/Behavioral: Negative for depression. The patient is not nervous/anxious.        Objective:    Physical Exam  Constitutional: She is oriented to person, place, and time. She appears well-developed and well-nourished. No distress.  HENT:  Head: Normocephalic and atraumatic.  Nose: Nose normal.  Nasal mucosa erythematous no lesions seen  Eyes: Right eye exhibits no discharge. Left eye exhibits no discharge.  Neck: Normal range of motion. Neck supple.  Cardiovascular: Normal rate and regular rhythm.   No murmur heard. Pulmonary/Chest: Effort normal and breath sounds normal.  Abdominal: Soft. Bowel sounds are normal. There is no tenderness.  Musculoskeletal: She exhibits no edema.  Neurological: She is alert and oriented to person, place, and time.  Skin: Skin is warm and dry.  Psychiatric: She has a normal mood and affect.  Nursing note and vitals reviewed.   BP  121/74 (BP Location: Right Arm, Patient Position: Sitting, Cuff Size: Normal)   Pulse 78   Temp 98.6 F (37 C) (Oral)   Ht 5' 7.5" (1.715 m)   Wt 190 lb 2 oz (86.2 kg)   SpO2 99%   BMI 29.34 kg/m  Wt Readings from Last 3 Encounters:  10/26/16 190 lb 2 oz (86.2 kg)  09/21/16 182 lb (82.6 kg)  09/17/16 185 lb 9.6 oz (84.2 kg)     Lab Results  Component Value Date   WBC 8.4 09/21/2016   HGB 12.7 09/21/2016   HCT 38.3 09/21/2016   PLT 358.0 09/21/2016   GLUCOSE 95 04/23/2016   CHOL 151 04/23/2016   TRIG 54.0 04/23/2016   HDL 82.80 04/23/2016   LDLCALC 58 04/23/2016   ALT 12 04/23/2016   AST 15 04/23/2016   NA  137 04/23/2016   K 4.6 04/23/2016   CL 105 04/23/2016   CREATININE 0.81 04/23/2016   BUN 11 04/23/2016   CO2 26 04/23/2016   TSH 2.00 04/23/2016   INR 1.0 12/05/2015   HGBA1C 5.4 04/23/2016    Lab Results  Component Value Date   TSH 2.00 04/23/2016   Lab Results  Component Value Date   WBC 8.4 09/21/2016   HGB 12.7 09/21/2016   HCT 38.3 09/21/2016   MCV 89.5 09/21/2016   PLT 358.0 09/21/2016   Lab Results  Component Value Date   NA 137 04/23/2016   K 4.6 04/23/2016   CO2 26 04/23/2016   GLUCOSE 95 04/23/2016   BUN 11 04/23/2016   CREATININE 0.81 04/23/2016   BILITOT 0.5 04/23/2016   ALKPHOS 47 04/23/2016   AST 15 04/23/2016   ALT 12 04/23/2016   PROT 7.4 04/23/2016   ALBUMIN 4.1 04/23/2016   CALCIUM 9.2 04/23/2016   GFR 81.32 04/23/2016   Lab Results  Component Value Date   CHOL 151 04/23/2016   Lab Results  Component Value Date   HDL 82.80 04/23/2016   Lab Results  Component Value Date   LDLCALC 58 04/23/2016   Lab Results  Component Value Date   TRIG 54.0 04/23/2016   Lab Results  Component Value Date   CHOLHDL 2 04/23/2016   Lab Results  Component Value Date   HGBA1C 5.4 04/23/2016       Assessment & Plan:   Problem List Items Addressed This Visit    Insomnia    Encouraged good sleep hygiene such as dark, quiet  room. No blue/green glowing lights such as computer screens in bedroom. No alcohol or stimulants in evening. Cut down on caffeine as able. Regular exercise is helpful but not just prior to bed time. Given refill on Ambien which she uses intermittently      Preventative health care   Relevant Medications   ALPRAZolam (XANAX) 0.25 MG tablet   Vitamin D deficiency    Continue daily supplements      Relevant Medications   ALPRAZolam (XANAX) 0.25 MG tablet   Anxiety and depression    Doing well on Lexapro, given refills on alprazolam to use prn      Relevant Medications   ALPRAZolam (XANAX) 0.25 MG tablet   Hyperglycemia    minimize simple carbs. Increase exercise as tolerated.      Epistaxis    Right nostril mild, likely related to URI, given Mupirocin ointment to use prn      Acute upper respiratory infection    Improving, Encouraged increased rest and hydration, add probiotics, zinc such as Coldeze or Xicam. Treat fevers as needed. Mucinex, vit C, elderberry      Relevant Medications   mupirocin ointment (BACTROBAN) 2 %    Other Visit Diagnoses   None.     I have discontinued Ms. Meddings HYDROcodone-acetaminophen, doxycycline, and clindamycin. I am also having her start on mupirocin ointment. Additionally, I am having her maintain her Cholecalciferol, ibuprofen, Ferrous Fumarate-Folic Acid, b complex vitamins, escitalopram, omeprazole, ALPRAZolam, zolpidem, and desogestrel-ethinyl estradiol.  Meds ordered this encounter  Medications  . mupirocin ointment (BACTROBAN) 2 %    Sig: Place 1 application into the nose 2 (two) times daily.    Dispense:  22 g    Refill:  0  . escitalopram (LEXAPRO) 20 MG tablet    Sig: Take 1 tablet (20 mg total) by mouth daily.    Dispense:  30  tablet    Refill:  5  . omeprazole (PRILOSEC) 20 MG capsule    Sig: Take 1 capsule (20 mg total) by mouth daily.    Dispense:  30 capsule    Refill:  5  . ALPRAZolam (XANAX) 0.25 MG tablet    Sig:  Take 1 tablet (0.25 mg total) by mouth 2 (two) times daily as needed for anxiety or sleep.    Dispense:  45 tablet    Refill:  3  . zolpidem (AMBIEN) 10 MG tablet    Sig: Take 1 tablet (10 mg total) by mouth at bedtime as needed for sleep.    Dispense:  30 tablet    Refill:  2  . desogestrel-ethinyl estradiol (KARIVA) 0.15-0.02/0.01 MG (21/5) tablet    Sig: Take 1 tablet by mouth daily.    Dispense:  1 Package    Refill:  5     Penni Homans, MD

## 2016-10-26 NOTE — Assessment & Plan Note (Signed)
Continue daily supplements 

## 2016-10-26 NOTE — Progress Notes (Signed)
Pre visit review using our clinic review tool, if applicable. No additional management support is needed unless otherwise documented below in the visit note. 

## 2016-10-26 NOTE — Assessment & Plan Note (Signed)
Right nostril mild, likely related to URI, given Mupirocin ointment to use prn

## 2016-10-26 NOTE — Patient Instructions (Signed)
Encouraged increased rest and hydration, add probiotics 10 strain caps 1 daily, [employer], zinc such as Coldeze or Xicam. Treat fevers as needed. Vitamin C 500 to 1000 mg, Aged or Black garlic daily, Elderberry Plain Mucinex twice daily Upper Respiratory Infection, Adult Most upper respiratory infections (URIs) are a viral infection of the air passages leading to the lungs. A URI affects the nose, throat, and upper air passages. The most common type of URI is nasopharyngitis and is typically referred to as "the common cold." URIs run their course and usually go away on their own. Most of the time, a URI does not require medical attention, but sometimes a bacterial infection in the upper airways can follow a viral infection. This is called a secondary infection. Sinus and middle ear infections are common types of secondary upper respiratory infections. Bacterial pneumonia can also complicate a URI. A URI can worsen asthma and chronic obstructive pulmonary disease (COPD). Sometimes, these complications can require emergency medical care and may be life threatening.  CAUSES Almost all URIs are caused by viruses. A virus is a type of germ and can spread from one person to another.  RISKS FACTORS You may be at risk for a URI if:   You smoke.   You have chronic heart or lung disease.  You have a weakened defense (immune) system.   You are very young or very old.   You have nasal allergies or asthma.  You work in crowded or poorly ventilated areas.  You work in health care facilities or schools. SIGNS AND SYMPTOMS  Symptoms typically develop 2-3 days after you come in contact with a cold virus. Most viral URIs last 7-10 days. However, viral URIs from the influenza virus (flu virus) can last 14-18 days and are typically more severe. Symptoms may include:   Runny or stuffy (congested) nose.   Sneezing.   Cough.   Sore throat.   Headache.   Fatigue.   Fever.   Loss of  appetite.   Pain in your forehead, behind your eyes, and over your cheekbones (sinus pain).  Muscle aches.  DIAGNOSIS  Your health care provider may diagnose a URI by:  Physical exam.  Tests to check that your symptoms are not due to another condition such as:  Strep throat.  Sinusitis.  Pneumonia.  Asthma. TREATMENT  A URI goes away on its own with time. It cannot be cured with medicines, but medicines may be prescribed or recommended to relieve symptoms. Medicines may help:  Reduce your fever.  Reduce your cough.  Relieve nasal congestion. HOME CARE INSTRUCTIONS   Take medicines only as directed by your health care provider.   Gargle warm saltwater or take cough drops to comfort your throat as directed by your health care provider.  Use a warm mist humidifier or inhale steam from a shower to increase air moisture. This may make it easier to breathe.  Drink enough fluid to keep your urine clear or pale yellow.   Eat soups and other clear broths and maintain good nutrition.   Rest as needed.   Return to work when your temperature has returned to normal or as your health care provider advises. You may need to stay home longer to avoid infecting others. You can also use a face mask and careful hand washing to prevent spread of the virus.  Increase the usage of your inhaler if you have asthma.   Do not use any tobacco products, including cigarettes, chewing tobacco, or  electronic cigarettes. If you need help quitting, ask your health care provider. PREVENTION  The best way to protect yourself from getting a cold is to practice good hygiene.   Avoid oral or hand contact with people with cold symptoms.   Wash your hands often if contact occurs.  There is no clear evidence that vitamin C, vitamin E, echinacea, or exercise reduces the chance of developing a cold. However, it is always recommended to get plenty of rest, exercise, and practice good nutrition.    SEEK MEDICAL CARE IF:   You are getting worse rather than better.   Your symptoms are not controlled by medicine.   You have chills.  You have worsening shortness of breath.  You have brown or red mucus.  You have yellow or brown nasal discharge.  You have pain in your face, especially when you bend forward.  You have a fever.  You have swollen neck glands.  You have pain while swallowing.  You have white areas in the back of your throat. SEEK IMMEDIATE MEDICAL CARE IF:   You have severe or persistent:  Headache.  Ear pain.  Sinus pain.  Chest pain.  You have chronic lung disease and any of the following:  Wheezing.  Prolonged cough.  Coughing up blood.  A change in your usual mucus.  You have a stiff neck.  You have changes in your:  Vision.  Hearing.  Thinking.  Mood. MAKE SURE YOU:   Understand these instructions.  Will watch your condition.  Will get help right away if you are not doing well or get worse.   This information is not intended to replace advice given to you by your health care provider. Make sure you discuss any questions you have with your health care provider.   Document Released: 06/09/2001 Document Revised: 04/30/2015 Document Reviewed: 03/21/2014 Elsevier Interactive Patient Education [employer].

## 2016-10-26 NOTE — Assessment & Plan Note (Signed)
Encouraged good sleep hygiene such as dark, quiet room. No blue/green glowing lights such as computer screens in bedroom. No alcohol or stimulants in evening. Cut down on caffeine as able. Regular exercise is helpful but not just prior to bed time. Given refill on Ambien which she uses intermittently

## 2016-10-26 NOTE — Assessment & Plan Note (Signed)
Improving, Encouraged increased rest and hydration, add probiotics, zinc such as Coldeze or Xicam. Treat fevers as needed. Mucinex, vit C, elderberry

## 2016-10-26 NOTE — Assessment & Plan Note (Signed)
Doing well on Lexapro, given refills on alprazolam to use prn

## 2016-10-26 NOTE — Assessment & Plan Note (Signed)
minimize simple carbs. Increase exercise as tolerated.  

## 2016-10-30 DIAGNOSIS — J00 Acute nasopharyngitis [common cold]: Secondary | ICD-10-CM | POA: Diagnosis not present

## 2016-10-30 DIAGNOSIS — R197 Diarrhea, unspecified: Secondary | ICD-10-CM | POA: Diagnosis not present

## 2016-10-30 DIAGNOSIS — R110 Nausea: Secondary | ICD-10-CM | POA: Diagnosis not present

## 2016-10-31 NOTE — Assessment & Plan Note (Signed)
.  sb

## 2016-11-02 DIAGNOSIS — J069 Acute upper respiratory infection, unspecified: Secondary | ICD-10-CM | POA: Diagnosis not present

## 2016-11-05 DIAGNOSIS — B379 Candidiasis, unspecified: Secondary | ICD-10-CM | POA: Diagnosis not present

## 2016-11-05 DIAGNOSIS — R05 Cough: Secondary | ICD-10-CM | POA: Diagnosis not present

## 2016-11-05 DIAGNOSIS — H10022 Other mucopurulent conjunctivitis, left eye: Secondary | ICD-10-CM | POA: Diagnosis not present

## 2016-11-05 DIAGNOSIS — H66003 Acute suppurative otitis media without spontaneous rupture of ear drum, bilateral: Secondary | ICD-10-CM | POA: Diagnosis not present

## 2016-11-12 DIAGNOSIS — J322 Chronic ethmoidal sinusitis: Secondary | ICD-10-CM | POA: Diagnosis not present

## 2016-11-12 DIAGNOSIS — J040 Acute laryngitis: Secondary | ICD-10-CM | POA: Diagnosis not present

## 2016-11-12 DIAGNOSIS — J320 Chronic maxillary sinusitis: Secondary | ICD-10-CM | POA: Diagnosis not present

## 2016-11-12 DIAGNOSIS — H6523 Chronic serous otitis media, bilateral: Secondary | ICD-10-CM | POA: Diagnosis not present

## 2016-12-17 DIAGNOSIS — L02215 Cutaneous abscess of perineum: Secondary | ICD-10-CM | POA: Diagnosis not present

## 2017-01-01 DIAGNOSIS — M9902 Segmental and somatic dysfunction of thoracic region: Secondary | ICD-10-CM | POA: Diagnosis not present

## 2017-01-01 DIAGNOSIS — M531 Cervicobrachial syndrome: Secondary | ICD-10-CM | POA: Diagnosis not present

## 2017-01-01 DIAGNOSIS — M9903 Segmental and somatic dysfunction of lumbar region: Secondary | ICD-10-CM | POA: Diagnosis not present

## 2017-01-01 DIAGNOSIS — M9901 Segmental and somatic dysfunction of cervical region: Secondary | ICD-10-CM | POA: Diagnosis not present

## 2017-01-18 ENCOUNTER — Ambulatory Visit (INDEPENDENT_AMBULATORY_CARE_PROVIDER_SITE_OTHER): Payer: BLUE CROSS/BLUE SHIELD | Admitting: Medical

## 2017-01-18 ENCOUNTER — Encounter: Payer: Self-pay | Admitting: Medical

## 2017-01-18 ENCOUNTER — Ambulatory Visit (HOSPITAL_BASED_OUTPATIENT_CLINIC_OR_DEPARTMENT_OTHER)
Admission: RE | Admit: 2017-01-18 | Discharge: 2017-01-18 | Disposition: A | Discharge status: Home / Self Care | Payer: BLUE CROSS/BLUE SHIELD | Source: Ambulatory Visit | Attending: Medical | Admitting: Medical

## 2017-01-18 VITALS — BP 0/0 | HR 74 | Temp 98.0°F | Resp 16 | Ht 67.0 in | Wt 185.4 lb

## 2017-01-18 DIAGNOSIS — R05 Cough: Secondary | ICD-10-CM | POA: Diagnosis not present

## 2017-01-18 DIAGNOSIS — J209 Acute bronchitis, unspecified: Secondary | ICD-10-CM

## 2017-01-18 DIAGNOSIS — R9431 Abnormal electrocardiogram [ECG] [EKG]: Secondary | ICD-10-CM

## 2017-01-18 DIAGNOSIS — R079 Chest pain, unspecified: Secondary | ICD-10-CM | POA: Diagnosis not present

## 2017-01-18 DIAGNOSIS — R059 Cough, unspecified: Secondary | ICD-10-CM

## 2017-01-18 DIAGNOSIS — M940 Chondrocostal junction syndrome [Tietze]: Secondary | ICD-10-CM

## 2017-01-18 LAB — CBC WITH DIFFERENTIAL/PLATELET
Basophils Absolute: 0 10*3/uL (ref 0.0–0.1)
Basophils Relative: 0.4 % (ref 0.0–3.0)
Eosinophils Absolute: 0.3 10*3/uL (ref 0.0–0.7)
Eosinophils Relative: 3.4 % (ref 0.0–5.0)
HCT: 34.8 % — ABNORMAL LOW (ref 36.0–46.0)
Hemoglobin: 11.6 g/dL — ABNORMAL LOW (ref 12.0–15.0)
Lymphocytes Relative: 28.7 % (ref 12.0–46.0)
Lymphs Abs: 2.6 10*3/uL (ref 0.7–4.0)
MCHC: 33.2 g/dL (ref 30.0–36.0)
MCV: 87.9 fl (ref 78.0–100.0)
Monocytes Absolute: 0.6 10*3/uL (ref 0.1–1.0)
Monocytes Relative: 6.8 % (ref 3.0–12.0)
Neutro Abs: 5.6 10*3/uL (ref 1.4–7.7)
Neutrophils Relative %: 60.7 % (ref 43.0–77.0)
Platelets: 292 10*3/uL (ref 150.0–400.0)
RBC: 3.96 Mil/uL (ref 3.87–5.11)
RDW: 15.8 % — ABNORMAL HIGH (ref 11.5–15.5)
WBC: 9.2 10*3/uL (ref 4.0–10.5)

## 2017-01-18 LAB — TROPONIN I: TNIDX: 0 ug/l (ref 0.00–0.06)

## 2017-01-18 MED ORDER — DICLOFENAC SODIUM 75 MG PO TBEC: 75.0000 mg | DELAYED_RELEASE_TABLET | Freq: Two times a day (BID) | ORAL | 0 refills | Status: DC

## 2017-01-18 MED ORDER — DOXYCYCLINE HYCLATE 100 MG PO TABS: 100.0000 mg | ORAL_TABLET | Freq: Two times a day (BID) | ORAL | 0 refills | Status: DC

## 2017-01-18 NOTE — Progress Notes (Signed)
Pre visit review using our clinic review tool, if applicable. No additional management support is needed unless otherwise documented below in the visit note/SLS  

## 2017-01-18 NOTE — Progress Notes (Signed)
Subjective:    Patient ID: Samantha Morales, female    DOB: March 18, 1971, 46 y.o.   MRN: [id number]  HPI  Pt in for some recent onset of chest congestion and productive cough for about on 7-10 days. Pt feels some chills but no fever. Pt states last 3 nights has been sweating. Pt states sweats not new. She states told she is in early menopause.  Pt also has some left lower rib region and chest pain. Pt has pain worse on coughing, changing position and on direct palpation. Pain is not upper chest.  About one ago when cutting someone hair pain in lower rib and her left arm felt transient numbness to left arm for only 10 seconds but no recurrent arm.  Pt does smoke. Social smoker(1 pack every 2-3 weeks). Cholesterol is good. No family history of heart attack or stroke.        Review of Systems  Constitutional: Positive for chills and diaphoresis. Negative for fatigue and fever.  HENT: Negative for congestion, ear pain, postnasal drip, rhinorrhea, sinus pain and sinus pressure.   Respiratory: Positive for cough. Negative for shortness of breath and wheezing.        Productive cough. But not severe productive.  Cardiovascular: Negative for chest pain and palpitations.       Left lower rib pain.  Gastrointestinal: Negative for abdominal pain, diarrhea, nausea and vomiting.  Musculoskeletal: Negative for back pain, myalgias and neck stiffness.  Skin: Negative for rash.  Neurological: Negative for dizziness, syncope, speech difficulty, weakness and headaches.  Hematological: Negative for adenopathy. Does not bruise/bleed easily.  Psychiatric/Behavioral: Negative for behavioral problems and confusion. The patient is not nervous/anxious.        Objective:   Physical Exam  General  Mental Status - Alert. General Appearance - Well groomed. Not in acute distress.  Skin Rashes- No Rashes.  HEENT Head- Normal. Ear Auditory Canal - Left- Normal. Right - Normal.Tympanic Membrane- Left-  Normal. Right- Normal. Eye Sclera/Conjunctiva- Left- Normal. Right- Normal. Nose & Sinuses Nasal Mucosa- Left-  Boggy and Congested. Right-  Boggy and  Congested.Bilateral no maxillary and  No frontal sinus pressure. Mouth & Throat Lips: Upper Lip- Normal: no dryness, cracking, pallor, cyanosis, or vesicular eruption. Lower Lip-Normal: no dryness, cracking, pallor, cyanosis or vesicular eruption. Buccal Mucosa- Bilateral- No Aphthous ulcers. Oropharynx- No Discharge or Erythema. Tonsils: Characteristics- Bilateral- No Erythema or Congestion. Size/Enlargement- Bilateral- No enlargement. Discharge- bilateral-None.  Neck Neck- Supple. No Masses.   Chest and Lung Exam Auscultation: Breath Sounds:-Clear even and unlabored.  Cardiovascular Auscultation:Rythm- Regular, rate and rhythm. Murmurs & Other Heart Sounds:Ausculatation of the heart reveal- No Murmurs.  Lymphatic Head & Neck General Head & Neck Lymphatics: Bilateral: Description- No Localized lymphadenopathy.  Lower ribs- tender to palpation on exam. Tender on lower ribs and tender left lower costochondral junction.(pain increased on direct palpation and cough). Pain increased on lying supine).       Assessment & Plan:  Pt work number [phone].  You appear to have bronchitis. Rest hydrate and tylenol for fever. I am prescribing cough medicine benzonatate, and doxycycline antibiotic.  Get cxr today.  Rx diclofenac for rib/costonchondritis pain.   If pain worsens or changes in your left lower rib area as discussed  then ED evaluation.  Will get troponin stat today. Do recommend you stop smoking as this is your worst cardiac risk factor.  On your ekg it showed possible short pr interval. I will review prior ekg and make  decision if cardiology referral needed.(decided to refer)  Follow up in 7-10 days or as needed  Decided to go ahead and refer to cardiologist for short pr interval.

## 2017-01-18 NOTE — Patient Instructions (Addendum)
You appear to have bronchitis. Rest hydrate and tylenol for fever. I am prescribing cough medicine benzonatate, and doxycycline antibiotic.  Get cxr today.  Rx diclofenac for rib/costonchondritis pain.   If pain worsens or changes in your left lower rib area as discussed  then ED evaluation.  Will get troponin stat today. Do recommend you stop smoking as this is your worst cardiac  risk factor.  On your ekg it showed possible short pr interval. I will review prior ekg and make decision if cardiology referral needed.(decided to refer)  Follow up in 7-10 days or as needed

## 2017-01-19 ENCOUNTER — Telehealth: Payer: Self-pay | Admitting: Medical

## 2017-01-19 MED ORDER — FLUCONAZOLE 150 MG PO TABS: 150.0000 mg | ORAL_TABLET | Freq: Once | ORAL | 0 refills | Status: AC

## 2017-01-19 NOTE — Telephone Encounter (Signed)
Pt notified of instructions and verbalized understanding. 

## 2017-01-19 NOTE — Telephone Encounter (Signed)
She will take if gets yeast infection symptoms.

## 2017-01-19 NOTE — Telephone Encounter (Signed)
rx diflucan sent to her pharmacy.

## 2017-01-21 DIAGNOSIS — Z1231 Encounter for screening mammogram for malignant neoplasm of breast: Secondary | ICD-10-CM | POA: Diagnosis not present

## 2017-01-21 DIAGNOSIS — Z01419 Encounter for gynecological examination (general) (routine) without abnormal findings: Secondary | ICD-10-CM | POA: Diagnosis not present

## 2017-01-21 DIAGNOSIS — Z6828 Body mass index (BMI) 28.0-28.9, adult: Secondary | ICD-10-CM | POA: Diagnosis not present

## 2017-02-03 DIAGNOSIS — M531 Cervicobrachial syndrome: Secondary | ICD-10-CM | POA: Diagnosis not present

## 2017-02-03 DIAGNOSIS — M9902 Segmental and somatic dysfunction of thoracic region: Secondary | ICD-10-CM | POA: Diagnosis not present

## 2017-02-03 DIAGNOSIS — M9901 Segmental and somatic dysfunction of cervical region: Secondary | ICD-10-CM | POA: Diagnosis not present

## 2017-02-03 DIAGNOSIS — M9903 Segmental and somatic dysfunction of lumbar region: Secondary | ICD-10-CM | POA: Diagnosis not present

## 2017-02-12 DIAGNOSIS — J00 Acute nasopharyngitis [common cold]: Secondary | ICD-10-CM | POA: Diagnosis not present

## 2017-02-12 DIAGNOSIS — R05 Cough: Secondary | ICD-10-CM | POA: Diagnosis not present

## 2017-02-24 ENCOUNTER — Encounter: Payer: Self-pay | Admitting: Cardiovascular Disease

## 2017-02-24 ENCOUNTER — Ambulatory Visit (INDEPENDENT_AMBULATORY_CARE_PROVIDER_SITE_OTHER): Payer: BLUE CROSS/BLUE SHIELD | Admitting: Cardiovascular Disease

## 2017-02-24 VITALS — BP 124/82 | HR 62 | Ht 67.0 in | Wt 185.4 lb

## 2017-02-24 DIAGNOSIS — R9431 Abnormal electrocardiogram [ECG] [EKG]: Secondary | ICD-10-CM

## 2017-02-24 DIAGNOSIS — Z720 Tobacco use: Secondary | ICD-10-CM | POA: Diagnosis not present

## 2017-02-24 NOTE — Progress Notes (Signed)
Cardiology Office Note   Date:  02/24/2017   ID:  Samantha Morales, DOB Jun 07, 1971, MRN [id number]  PCP:  Penni Homans, MD  Cardiologist:   Skeet Latch, MD   Chief Complaint  Patient presents with  . New Patient (Initial Visit)    Pt states no Sx.       History of Present Illness: Samantha Morales is a 46 y.o. female with tobacco abuse who presents for an evaluation of an abnormal EKG.  She was seen by Mackie Pai, PA-C on 12/2016 for bronchitis.  EKG at that time revealed a short PR interval of 112 ms.  No delta waves were noted.  She was referred to cardiology for further evaluation.  She has been experiencing recurrent URIs, ear infections and bronichitis since September.  She has been coughing heavily and had purulent sputum.  She initially saw her PCP due to sharp pain under her left breast that occurs after coughing.  She continues to have the pain, though it has improved.  It is worse deep breaths with movement. She denies exertional chest pain, though she doesn't get formal exercise.  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 She denies lower extremity edema, orthopnea, or PND.  She also hasn't noted any palpitations, lightheadedness, or dizziness. She reports social smoking and her husband smokes.   Past Medical History:  Diagnosis Date  . Acute upper respiratory infection 10/26/2016  . Anemia 03/13/2015  . ANEMIA, HX OF 01/20/2011  . Atypical chest pain 04/15/2012  . Back pain 07/22/2012  . Cellulitis of ankle 05/12/2011  . CHICKENPOX, HX OF 01/20/2011  . DEPRESSION 01/20/2011  . Elevated BP 04/15/2012  . Epistaxis 10/26/2016  . GERD 01/20/2011  . Hyperglycemia 05/10/2016  . Insomnia 12/12/2012  . LACERATION OF FINGER 01/27/2011  . Overweight 01/19/2016  . Preventative health care 02/28/2014  . RESTLESS LEG SYNDROME, HX OF 01/20/2011  . Sacroiliac joint pain 07/22/2012  . Sinusitis, acute 12/12/2012  . Tobacco abuse 05/12/2011  . Tobacco abuse, in remission  05/12/2011    Past Surgical History:  Procedure Laterality Date  . lasik eye surgery X b/l       Current Outpatient Prescriptions  Medication Sig Dispense Refill  . ALPRAZolam (XANAX) 0.25 MG tablet Take 1 tablet (0.25 mg total) by mouth 2 (two) times daily as needed for anxiety or sleep. 45 tablet 3  . b complex vitamins capsule Take 1 capsule by mouth daily. 30 capsule 3  . cetirizine (ZYRTEC) 10 MG tablet Take 10 mg by mouth daily.    . Cholecalciferol (EQL VITAMIN D3) 1000 UNITS tablet Take 1,000 Units by mouth daily.      Marland Kitchen escitalopram (LEXAPRO) 20 MG tablet Take 1 tablet (20 mg total) by mouth daily. 30 tablet 5  . ibuprofen (ADVIL,MOTRIN) 200 MG tablet Take 200 mg by mouth every 6 (six) hours as needed.    Marland Kitchen omeprazole (PRILOSEC) 20 MG capsule Take 1 capsule (20 mg total) by mouth daily. 30 capsule 5  . zolpidem (AMBIEN) 10 MG tablet Take 1 tablet (10 mg total) by mouth at bedtime as needed for sleep. 30 tablet 3   No current facility-administered medications for this visit.     Allergies:   Erythromycin; Penicillins; and Sulfonamide derivatives    Social History:  The patient  reports that she quit smoking about 4 years ago. She smoked 0.50 packs per day. She has never used smokeless tobacco. She reports that  she drinks alcohol. She reports that she does not use drugs.   Family History:  The patient's family history includes Alzheimer's disease in her maternal grandmother; Anemia in her brother; Aneurysm in her maternal grandfather; Arrhythmia in her mother; Arthritis in her maternal grandfather; Cancer in her maternal grandmother; Heart attack in her paternal grandfather; Hypertension in her brother and mother; Lupus in her sister; Nephrolithiasis in her paternal grandmother.    ROS:  Please see the history of present illness.   Otherwise, review of systems are positive for none.   All other systems are reviewed and negative.    PHYSICAL EXAM: VS:  BP 124/82   Pulse 62    Ht 5\' 7"  (1.702 m)   Wt 84.1 kg (185 lb 6.4 oz)   BMI 29.04 kg/m   , BMI Body mass index is 29.04 kg/m. GENERAL:  Well appearing HEENT:  Pupils equal round and reactive, fundi not visualized, oral mucosa unremarkable NECK:  No jugular venous distention, waveform within normal limits, carotid upstroke brisk and symmetric, no bruits, no thyromegaly LYMPHATICS:  No cervical adenopathy LUNGS:  Clear to auscultation bilaterally HEART:  RRR.  PMI not displaced or sustained,S1 and S2 within normal limits, no S3, no S4, no clicks, no rubs, no murmurs ABD:  Flat, positive bowel sounds normal in frequency in pitch, no bruits, no rebound, no guarding, no midline pulsatile mass, no hepatomegaly, no splenomegaly EXT:  2 plus pulses throughout, no edema, no cyanosis no clubbing SKIN:  No rashes no nodules NEURO:  Cranial nerves II through XII grossly intact, motor grossly intact throughout PSYCH:  Cognitively intact, oriented to person place and time    EKG:  EKG is ordered today. The ekg ordered today demonstrates sinus rhythm. Rate 62 bpm. PR interval 142 ms. 01/18/17: Sinus rhythm. Rate 68 bpm. PR interval 112 ms.   Recent Labs: 04/23/2016: ALT 12; BUN 11; Creatinine, Ser 0.81; Potassium 4.6; Sodium 137; TSH 2.00 01/18/2017: Hemoglobin 11.6; Platelets 292.0    Lipid Panel    Component Value Date/Time   CHOL 151 04/23/2016 1116   TRIG 54.0 04/23/2016 1116   HDL 82.80 04/23/2016 1116   CHOLHDL 2 04/23/2016 1116   VLDL 10.8 04/23/2016 1116   LDLCALC 58 04/23/2016 1116      Wt Readings from Last 3 Encounters:  02/24/17 84.1 kg (185 lb 6.4 oz)  01/18/17 84.1 kg (185 lb 6 oz)  10/26/16 86.2 kg (190 lb 2 oz)      ASSESSMENT AND PLAN:  # Short PR Interval:  Samantha Morales PR interval on EKG 1/201 was 112 ms. Her EKG was otherwise unremarkable. There is no evidence of delta waves that would be expected for Wolff-Parkinson-White syndrome. This could have been a Lown-Ganong-Levine pattern  which indicates a perinodal accessory pathway or enhance AV conductionHowever, her EKG today is completely unremarkable. Visually her prior PR interval appeared normal. Given that she is not experiencing any symptoms such as palpitations and her EKG today is normal, we will not pursue any additional testing at this time. If she can start having palpitations we will consider him to try monitoring possible EP referral for consideration of EP study.     # CV Disease Prevention: Samantha Morales was encouraged to start exercising 90-150 minutes weekly. Her lipid profile is excellent. She was also encouraged to stop smoking even in social situations.     Current medicines are reviewed at length with the patient today.  The patient does not have concerns regarding  medicines.  The following changes have been made:  no change  Labs/ tests ordered today include:  No orders of the defined types were placed in this encounter.    Disposition:   FU with Kentavius Dettore C. Oval Linsey, MD, [facility] as needed    This note was written with the assistance of speech recognition software.  Please excuse any transcriptional errors.  Signed, Naudia Crosley C. Oval Linsey, MD, [facility]  02/24/2017 8:50 AM    [facility]

## 2017-02-24 NOTE — Patient Instructions (Signed)
Medication Instructions:  .Your physician recommends that you continue on your current medications as directed. Please refer to the Current Medication list given to you today.  Labwork: none  Testing/Procedures: none  Follow-Up: As needed   

## 2017-04-12 ENCOUNTER — Encounter: Payer: BLUE CROSS/BLUE SHIELD | Admitting: Family Medicine

## 2017-04-13 ENCOUNTER — Encounter: Payer: Self-pay | Admitting: Family Medicine

## 2017-05-26 DIAGNOSIS — J0100 Acute maxillary sinusitis, unspecified: Secondary | ICD-10-CM | POA: Diagnosis not present

## 2017-06-11 DIAGNOSIS — M9903 Segmental and somatic dysfunction of lumbar region: Secondary | ICD-10-CM | POA: Diagnosis not present

## 2017-06-11 DIAGNOSIS — M531 Cervicobrachial syndrome: Secondary | ICD-10-CM | POA: Diagnosis not present

## 2017-06-11 DIAGNOSIS — M9901 Segmental and somatic dysfunction of cervical region: Secondary | ICD-10-CM | POA: Diagnosis not present

## 2017-06-11 DIAGNOSIS — M9902 Segmental and somatic dysfunction of thoracic region: Secondary | ICD-10-CM | POA: Diagnosis not present

## 2017-06-29 ENCOUNTER — Other Ambulatory Visit: Payer: Self-pay | Admitting: Family Medicine

## 2017-06-29 MED ORDER — OMEPRAZOLE 20 MG PO CPDR: 20.0000 mg | DELAYED_RELEASE_CAPSULE | Freq: Every day | ORAL | 0 refills | Status: DC

## 2017-07-16 ENCOUNTER — Other Ambulatory Visit: Payer: Self-pay | Admitting: Family Medicine

## 2017-09-09 ENCOUNTER — Encounter: Payer: Self-pay | Admitting: Family Medicine

## 2017-09-09 ENCOUNTER — Ambulatory Visit (INDEPENDENT_AMBULATORY_CARE_PROVIDER_SITE_OTHER): Payer: BLUE CROSS/BLUE SHIELD | Admitting: Family Medicine

## 2017-09-09 VITALS — BP 118/72 | HR 66 | Temp 98.1°F | Resp 18 | Wt 180.5 lb

## 2017-09-09 DIAGNOSIS — E559 Vitamin D deficiency, unspecified: Secondary | ICD-10-CM | POA: Diagnosis not present

## 2017-09-09 DIAGNOSIS — F329 Major depressive disorder, single episode, unspecified: Secondary | ICD-10-CM

## 2017-09-09 DIAGNOSIS — F419 Anxiety disorder, unspecified: Secondary | ICD-10-CM

## 2017-09-09 DIAGNOSIS — K219 Gastro-esophageal reflux disease without esophagitis: Secondary | ICD-10-CM

## 2017-09-09 DIAGNOSIS — F418 Other specified anxiety disorders: Secondary | ICD-10-CM

## 2017-09-09 DIAGNOSIS — F32A Depression, unspecified: Secondary | ICD-10-CM

## 2017-09-09 DIAGNOSIS — G4700 Insomnia, unspecified: Secondary | ICD-10-CM

## 2017-09-09 DIAGNOSIS — Z0000 Encounter for general adult medical examination without abnormal findings: Secondary | ICD-10-CM | POA: Diagnosis not present

## 2017-09-09 DIAGNOSIS — Z0283 Encounter for blood-alcohol and blood-drug test: Secondary | ICD-10-CM | POA: Diagnosis not present

## 2017-09-09 MED ORDER — ZOLPIDEM TARTRATE 10 MG PO TABS: 10.0000 mg | ORAL_TABLET | Freq: Every evening | ORAL | 3 refills | Status: DC | PRN

## 2017-09-09 MED ORDER — ALPRAZOLAM 0.25 MG PO TABS: 0.2500 mg | ORAL_TABLET | Freq: Two times a day (BID) | ORAL | 3 refills | Status: DC | PRN

## 2017-09-09 MED ORDER — BUSPIRONE HCL 10 MG PO TABS: 10.0000 mg | ORAL_TABLET | Freq: Three times a day (TID) | ORAL | 2 refills | Status: DC

## 2017-09-09 MED ORDER — OMEPRAZOLE 20 MG PO CPDR: 20.0000 mg | DELAYED_RELEASE_CAPSULE | Freq: Every day | ORAL | 1 refills | Status: DC

## 2017-09-09 NOTE — Progress Notes (Signed)
Subjective:  I acted as a Education administrator for Dr. Charlett Blake. [place], [place]  Patient ID: Samantha Morales, female    DOB: July 12, 1971, 46 y.o.   MRN: [id number]  No chief complaint on file.   HPI  Patient is in today for follow up. She is struggling with significant stress and anxiety. She has trouble sleeping and settling down at night. Notes anhedonia but denies suicidal ideation. Denies CP/palp/SOB/HA/congestion/fevers/GI or GU c/o. Taking meds as prescribed Patient Care Team: Mosie Lukes, MD as PCP - General (Family Medicine) Marylynn Pearson, MD as Consulting Physician (Obstetrics and Gynecology)   Past Medical History:  Diagnosis Date  . Acute upper respiratory infection 10/26/2016  . Anemia 03/13/2015  . ANEMIA, HX OF 01/20/2011  . Atypical chest pain 04/15/2012  . Back pain 07/22/2012  . Cellulitis of ankle 05/12/2011  . CHICKENPOX, HX OF 01/20/2011  . DEPRESSION 01/20/2011  . Elevated BP 04/15/2012  . Epistaxis 10/26/2016  . GERD 01/20/2011  . Hyperglycemia 05/10/2016  . Insomnia 12/12/2012  . LACERATION OF FINGER 01/27/2011  . Overweight 01/19/2016  . Preventative health care 02/28/2014  . RESTLESS LEG SYNDROME, HX OF 01/20/2011  . Sacroiliac joint pain 07/22/2012  . Sinusitis, acute 12/12/2012  . Tobacco abuse 05/12/2011  . 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 lasik eye surgery X b/l      Family History  Problem Relation Age of Onset  . Hypertension Mother   . Arrhythmia Mother   . Lupus Sister   . Hypertension Brother   . Cancer Maternal Grandmother        breast  . Alzheimer's disease Maternal Grandmother   . Aneurysm Maternal Grandfather        brain  . Arthritis Maternal Grandfather        rheumatoid  . Nephrolithiasis Paternal Grandmother   . Heart attack Paternal Grandfather   . Anemia Brother     Social History   Social History  . Marital status: Married    Spouse name: N/A  . Number of children: N/A    . Years of education: N/A   Occupational History  . Not on file.   Social History Main Topics  . Smoking status: Former Smoker    Packs/day: 0.50    Quit date: 04/12/2012  . Smokeless tobacco: Never Used  . Alcohol use 0.0 oz/week     Comment: 6-7 a week  . Drug use: No  . Sexual activity: Yes    Partners: Male     Comment: no dietary, lives with husband, wears seatbelt   Other Topics Concern  . Not on file   Social History Narrative  . No narrative on file    Outpatient Medications Prior to Visit  Medication Sig Dispense Refill  . cetirizine (ZYRTEC) 10 MG tablet Take 10 mg by mouth daily.    Marland Kitchen escitalopram (LEXAPRO) 20 MG tablet TAKE 1 TABLET (20 MG TOTAL) BY MOUTH DAILY. 30 tablet 5  . ibuprofen (ADVIL,MOTRIN) 200 MG tablet Take 200 mg by mouth every 6 (six) hours as needed.    . ALPRAZolam (XANAX) 0.25 MG tablet Take 1 tablet (0.25 mg total) by mouth 2 (two) times daily as needed for anxiety or sleep. 45 tablet 3  . b complex vitamins capsule Take 1 capsule by mouth daily. 30 capsule 3  . Cholecalciferol (EQL VITAMIN D3) 1000 UNITS tablet Take 1,000 Units by mouth daily.      Marland Kitchen  omeprazole (PRILOSEC) 20 MG capsule Take 1 capsule (20 mg total) by mouth daily. 90 capsule 0  . zolpidem (AMBIEN) 10 MG tablet Take 1 tablet (10 mg total) by mouth at bedtime as needed for sleep. 30 tablet 3   No facility-administered medications prior to visit.     Allergies  Allergen Reactions  . Erythromycin     REACTION: rash  . Penicillins     REACTION: swelling, rash  . Sulfonamide Derivatives     REACTION: GI upset    Review of Systems  Constitutional: Negative for fever and malaise/fatigue.  HENT: Negative for congestion.   Eyes: Negative for blurred vision.  Respiratory: Negative for shortness of breath.   Cardiovascular: Negative for chest pain, palpitations and leg swelling.  Gastrointestinal: Negative for abdominal pain, blood in stool and nausea.  Genitourinary:  Negative for dysuria and frequency.  Musculoskeletal: Negative for falls.  Skin: Negative for rash.  Neurological: Negative for dizziness, loss of consciousness and headaches.  Endo/Heme/Allergies: Negative for environmental allergies.  Psychiatric/Behavioral: Negative for depression. The patient is nervous/anxious and has insomnia.        Objective:    Physical Exam  Constitutional: She is oriented to person, place, and time. She appears well-developed and well-nourished. No distress.  HENT:  Head: Normocephalic and atraumatic.  Nose: Nose normal.  Eyes: Right eye exhibits no discharge. Left eye exhibits no discharge.  Neck: Normal range of motion. Neck supple.  Cardiovascular: Normal rate and regular rhythm.   No murmur heard. Pulmonary/Chest: Effort normal and breath sounds normal.  Abdominal: Soft. Bowel sounds are normal. There is no tenderness.  Musculoskeletal: She exhibits no edema.  Neurological: She is alert and oriented to person, place, and time.  Skin: Skin is warm and dry.  Psychiatric: She has a normal mood and affect.  Nursing note and vitals reviewed.   BP 118/72 (BP Location: Left Arm, Patient Position: Sitting, Cuff Size: Normal)   Pulse 66   Temp 98.1 F (36.7 C) (Oral)   Resp 18   Wt 180 lb 8 oz (81.9 kg)   SpO2 98%   BMI 28.27 kg/m  Wt Readings from Last 3 Encounters:  09/09/17 180 lb 8 oz (81.9 kg)  02/24/17 185 lb 6.4 oz (84.1 kg)  01/18/17 185 lb 6 oz (84.1 kg)   BP Readings from Last 3 Encounters:  09/09/17 118/72  02/24/17 124/82  01/18/17 (!) 0/0     Immunization History  Administered Date(s) Administered  . Influenza Split 09/27/2000, 12/08/2011  . Influenza Whole 10/21/2013  . Influenza,inj,Quad PF,6+ Mos 08/28/2014, 10/07/2015  . Influenza-Unspecified 08/28/2016  . Td 01/27/2011  . Tdap 04/23/2016    Health Maintenance  Topic Date Due  . HIV Screening  06/14/1986  . INFLUENZA VACCINE  07/28/2017  . PAP SMEAR  07/29/2018  .  TETANUS/TDAP  04/23/2026    Lab Results  Component Value Date   WBC 9.2 01/18/2017   HGB 11.6 (L) 01/18/2017   HCT 34.8 (L) 01/18/2017   PLT 292.0 01/18/2017   GLUCOSE 95 04/23/2016   CHOL 151 04/23/2016   TRIG 54.0 04/23/2016   HDL 82.80 04/23/2016   LDLCALC 58 04/23/2016   ALT 12 04/23/2016   AST 15 04/23/2016   NA 137 04/23/2016   K 4.6 04/23/2016   CL 105 04/23/2016   CREATININE 0.81 04/23/2016   BUN 11 04/23/2016   CO2 26 04/23/2016   TSH 2.00 04/23/2016   INR 1.0 12/05/2015   HGBA1C 5.4 04/23/2016  Lab Results  Component Value Date   TSH 2.00 04/23/2016   Lab Results  Component Value Date   WBC 9.2 01/18/2017   HGB 11.6 (L) 01/18/2017   HCT 34.8 (L) 01/18/2017   MCV 87.9 01/18/2017   PLT 292.0 01/18/2017   Lab Results  Component Value Date   NA 137 04/23/2016   K 4.6 04/23/2016   CO2 26 04/23/2016   GLUCOSE 95 04/23/2016   BUN 11 04/23/2016   CREATININE 0.81 04/23/2016   BILITOT 0.5 04/23/2016   ALKPHOS 47 04/23/2016   AST 15 04/23/2016   ALT 12 04/23/2016   PROT 7.4 04/23/2016   ALBUMIN 4.1 04/23/2016   CALCIUM 9.2 04/23/2016   GFR 81.32 04/23/2016   Lab Results  Component Value Date   CHOL 151 04/23/2016   Lab Results  Component Value Date   HDL 82.80 04/23/2016   Lab Results  Component Value Date   LDLCALC 58 04/23/2016   Lab Results  Component Value Date   TRIG 54.0 04/23/2016   Lab Results  Component Value Date   CHOLHDL 2 04/23/2016   Lab Results  Component Value Date   HGBA1C 5.4 04/23/2016         Assessment & Plan:   Problem List Items Addressed This Visit    Depression with anxiety    Will add Buspar and continue Lexapro. Minimize Alprazolam use.       GERD    Doing well will drop Omeprazole to 20 mg po qod and use Ranitidine on the opposite days. Then decrease use further as tolerated. Avoid offending foods.       Relevant Medications   omeprazole (PRILOSEC) 20 MG capsule   Insomnia    Using Zolpidem  prn Encouraged good sleep hygiene such as dark, quiet room. No blue/green glowing lights such as computer screens in bedroom. No alcohol or stimulants in evening. Cut down on caffeine as able. Regular exercise is helpful but not just prior to bed time.       Relevant Medications   zolpidem (AMBIEN) 10 MG tablet   Preventative health care   Relevant Medications   ALPRAZolam (XANAX) 0.25 MG tablet   Vitamin D deficiency   Relevant Medications   ALPRAZolam (XANAX) 0.25 MG tablet   RESOLVED: Anxiety and depression   Relevant Medications   ALPRAZolam (XANAX) 0.25 MG tablet    Other Visit Diagnoses    Encounter for drug screening    -  Primary   Relevant Orders   Pain Mgmt, Profile 8 w/Conf, U      I have discontinued Ms. Jobin Cholecalciferol and b complex vitamins. I am also having her start on busPIRone. Additionally, I am having her maintain her ibuprofen, cetirizine, escitalopram, ALPRAZolam, zolpidem, and omeprazole.  Meds ordered this encounter  Medications  . ALPRAZolam (XANAX) 0.25 MG tablet    Sig: Take 1 tablet (0.25 mg total) by mouth 2 (two) times daily as needed for anxiety or sleep.    Dispense:  45 tablet    Refill:  3  . zolpidem (AMBIEN) 10 MG tablet    Sig: Take 1 tablet (10 mg total) by mouth at bedtime as needed for sleep.    Dispense:  30 tablet    Refill:  3  . busPIRone (BUSPAR) 10 MG tablet    Sig: Take 1 tablet (10 mg total) by mouth 3 (three) times daily.    Dispense:  60 tablet    Refill:  2  . omeprazole (  PRILOSEC) 20 MG capsule    Sig: Take 1 capsule (20 mg total) by mouth daily.    Dispense:  90 capsule    Refill:  1    CMA served as scribe during this visit. History, Physical and Plan performed by medical provider. Documentation and orders reviewed and attested to.  Penni Homans, MD

## 2017-09-09 NOTE — Patient Instructions (Addendum)
buspar is for anxiety  Consider using Ranitidine/Zantac 150 mg every other day, alternating with Omeprazole and try stopping if able Generalized Anxiety Disorder, Adult Generalized anxiety disorder (GAD) is a mental health disorder. People with this condition constantly worry about everyday events. Unlike normal anxiety, worry related to GAD is not triggered by a specific event. These worries also do not fade or get better with time. GAD interferes with life functions, including relationships, work, and school. GAD can vary from mild to severe. People with severe GAD can have intense waves of anxiety with physical symptoms (panic attacks). What are the causes? The exact cause of GAD is not known. What increases the risk? This condition is more likely to develop in:  Women.  People who have a family history of anxiety disorders.  People who are very shy.  People who experience very stressful life events, such as the death of a loved one.  People who have a very stressful family environment.  What are the signs or symptoms? People with GAD often worry excessively about many things in their lives, such as their health and family. They may also be overly concerned about:  Doing well at work.  Being on time.  Natural disasters.  Friendships.  Physical symptoms of GAD include:  Fatigue.  Muscle tension or having muscle twitches.  Trembling or feeling shaky.  Being easily startled.  Feeling like your heart is pounding or racing.  Feeling out of breath or like you cannot take a deep breath.  Having trouble falling asleep or staying asleep.  Sweating.  Nausea, diarrhea, or irritable bowel syndrome (IBS).  Headaches.  Trouble concentrating or remembering facts.  Restlessness.  Irritability.  How is this diagnosed? Your health care provider can diagnose GAD based on your symptoms and medical history. You will also have a physical exam. The health care provider will  ask specific questions about your symptoms, including how severe they are, when they started, and if they come and go. Your health care provider may ask you about your use of alcohol or drugs, including prescription medicines. Your health care provider may refer you to a mental health specialist for further evaluation. Your health care provider will do a thorough examination and may perform additional tests to rule out other possible causes of your symptoms. To be diagnosed with GAD, a person must have anxiety that:  Is out of his or her control.  Affects several different aspects of his or her life, such as work and relationships.  Causes distress that makes him or her unable to take part in normal activities.  Includes at least three physical symptoms of GAD, such as restlessness, fatigue, trouble concentrating, irritability, muscle tension, or sleep problems.  Before your health care provider can confirm a diagnosis of GAD, these symptoms must be present more days than they are not, and they must last for six months or longer. How is this treated? The following therapies are usually used to treat GAD:  Medicine. Antidepressant medicine is usually prescribed for long-term daily control. Antianxiety medicines may be added in severe cases, especially when panic attacks occur.  Talk therapy (psychotherapy). Certain types of talk therapy can be helpful in treating GAD by providing support, education, and guidance. Options include: ? Cognitive behavioral therapy (CBT). People learn coping skills and techniques to ease their anxiety. They learn to identify unrealistic or negative thoughts and behaviors and to replace them with positive ones. ? Acceptance and commitment therapy (ACT). This treatment teaches people  how to be mindful as a way to cope with unwanted thoughts and feelings. ? Biofeedback. This process trains you to manage your body's response (physiological response) through breathing  techniques and relaxation methods. You will work with a therapist while machines are used to monitor your physical symptoms.  Stress management techniques. These include yoga, meditation, and exercise.  A mental health specialist can help determine which treatment is best for you. Some people see improvement with one type of therapy. However, other people require a combination of therapies. Follow these instructions at home:  Take over-the-counter and prescription medicines only as told by your health care provider.  Try to maintain a normal routine.  Try to anticipate stressful situations and allow extra time to manage them.  Practice any stress management or self-calming techniques as taught by your health care provider.  Do not punish yourself for setbacks or for not making progress.  Try to recognize your accomplishments, even if they are small.  Keep all follow-up visits as told by your health care provider. This is important. Contact a health care provider if:  Your symptoms do not get better.  Your symptoms get worse.  You have signs of depression, such as: ? A persistently sad, cranky, or irritable mood. ? Loss of enjoyment in activities that used to bring you joy. ? Change in weight or eating. ? Changes in sleeping habits. ? Avoiding friends or family members. ? Loss of energy for normal tasks. ? Feelings of guilt or worthlessness. Get help right away if:  You have serious thoughts about hurting yourself or others. If you ever feel like you may hurt yourself or others, or have thoughts about taking your own life, get help right away. You can go to your nearest emergency department or call:  Your local emergency services (911 in the [place]  A suicide crisis helpline, such as the [place] at 281 796 9493. This is open 24 hours a day.  Summary  Generalized anxiety disorder (GAD) is a mental health disorder that involves worry that is  not triggered by a specific event.  People with GAD often worry excessively about many things in their lives, such as their health and family.  GAD may cause physical symptoms such as restlessness, trouble concentrating, sleep problems, frequent sweating, nausea, diarrhea, headaches, and trembling or muscle twitching.  A mental health specialist can help determine which treatment is best for you. Some people see improvement with one type of therapy. However, other people require a combination of therapies. This information is not intended to replace advice given to you by your health care provider. Make sure you discuss any questions you have with your health care provider. Document Released: 04/10/2013 Document Revised: 11/03/2016 Document Reviewed: 11/03/2016 Elsevier Interactive Patient Education  [employer].

## 2017-09-12 LAB — PAIN MGMT, PROFILE 8 W/CONF, U
6 Acetylmorphine: NEGATIVE ng/mL (ref <10)
Alcohol Metabolites: POSITIVE ng/mL — AB (ref <500)
Amphetamines: NEGATIVE ng/mL (ref <500)
Benzodiazepines: NEGATIVE ng/mL (ref <100)
Buprenorphine, Urine: NEGATIVE ng/mL (ref <5)
Cocaine Metabolite: NEGATIVE ng/mL (ref <150)
Creatinine: 31.5 mg/dL
Ethyl Glucuronide (ETG): 23446 ng/mL — ABNORMAL HIGH (ref <500)
Ethyl Sulfate (ETS): 887 ng/mL — ABNORMAL HIGH (ref <100)
MDMA: NEGATIVE ng/mL (ref <500)
Marijuana Metabolite: NEGATIVE ng/mL (ref <20)
Opiates: NEGATIVE ng/mL (ref <100)
Oxidant: NEGATIVE ug/mL (ref <200)
Oxycodone: NEGATIVE ng/mL (ref <100)
pH: 5.68 (ref 4.5–9.0)

## 2017-09-12 NOTE — Assessment & Plan Note (Signed)
Will add Buspar and continue Lexapro. Minimize Alprazolam use.

## 2017-09-12 NOTE — Assessment & Plan Note (Signed)
Using Zolpidem prn Encouraged good sleep hygiene such as dark, quiet room. No blue/green glowing lights such as computer screens in bedroom. No alcohol or stimulants in evening. Cut down on caffeine as able. Regular exercise is helpful but not just prior to bed time.

## 2017-09-12 NOTE — Assessment & Plan Note (Signed)
Doing well will drop Omeprazole to 20 mg po qod and use Ranitidine on the opposite days. Then decrease use further as tolerated. Avoid offending foods.

## 2017-10-01 ENCOUNTER — Encounter: Payer: Self-pay | Admitting: Medical

## 2017-10-01 ENCOUNTER — Telehealth: Payer: Self-pay | Admitting: Family Medicine

## 2017-10-01 ENCOUNTER — Ambulatory Visit (INDEPENDENT_AMBULATORY_CARE_PROVIDER_SITE_OTHER): Payer: BLUE CROSS/BLUE SHIELD | Admitting: Medical

## 2017-10-01 VITALS — BP 127/82 | HR 58 | Temp 98.4°F | Resp 16 | Ht 67.0 in | Wt 184.6 lb

## 2017-10-01 DIAGNOSIS — J40 Bronchitis, not specified as acute or chronic: Secondary | ICD-10-CM | POA: Diagnosis not present

## 2017-10-01 DIAGNOSIS — R059 Cough, unspecified: Secondary | ICD-10-CM

## 2017-10-01 DIAGNOSIS — R05 Cough: Secondary | ICD-10-CM

## 2017-10-01 MED ORDER — AZITHROMYCIN 250 MG PO TABS: ORAL_TABLET | ORAL | 0 refills | Status: DC

## 2017-10-01 MED ORDER — FLUTICASONE PROPIONATE 50 MCG/ACT NA SUSP: 2.0000 | Freq: Every day | NASAL | 1 refills | Status: DC

## 2017-10-01 MED ORDER — ALBUTEROL SULFATE HFA 108 (90 BASE) MCG/ACT IN AERS: 2.0000 | INHALATION_SPRAY | Freq: Four times a day (QID) | RESPIRATORY_TRACT | 0 refills | Status: DC | PRN

## 2017-10-01 NOTE — Telephone Encounter (Signed)
[facility] [address] is correct pharmacy. Please make correction on chart. She will pick up today at [facility] but please make the correction in her chart going forward it is [facility] [address].

## 2017-10-01 NOTE — Patient Instructions (Addendum)
You appear to have bronchitis. Rest hydrate and tylenol for fever. Continue  cough medicine benzonatate and prescribed azithromycin  antibiotic. For your nasal congestion rx flonase.   You should gradually get better. If not then notify us and would recommend a chest xray.  Follow up in 7-10 days or as needed

## 2017-10-01 NOTE — Progress Notes (Signed)
Subjective:    Patient ID: Samantha Morales, female    DOB: 09-May-1971, 46 y.o.   MRN: [id number]  HPI   Pt in for some chest congestion and productive cough with brown colored mucous since Monday. Yesterday was rough day. She states had fever. Some  Minimal chills and sweats. T max was 100 max yesterday briefly. No body aches but mild fatigue.   Pt states has been taking benzonatate for her cough. She can sleep.    Review of Systems  Constitutional: Positive for fatigue and fever. Negative for chills and diaphoresis.  HENT: Positive for congestion and voice change. Negative for ear pain, sinus pain, sinus pressure, sneezing and sore throat.        Mild hoarse voice.  Respiratory: Positive for cough and wheezing. Negative for chest tightness and shortness of breath.        Pt thinks mild wheeze this morning but this is rare for her.  Cardiovascular: Negative for chest pain and palpitations.  Gastrointestinal: Negative for abdominal pain.  Genitourinary: Negative for dysuria.  Musculoskeletal: Negative for arthralgias, back pain, neck pain and neck stiffness.  Skin: Negative for rash.  Neurological: Negative for dizziness and headaches.  Hematological: Negative for adenopathy. Does not bruise/bleed easily.  Psychiatric/Behavioral: Negative for behavioral problems and confusion.     Past Medical History:  Diagnosis Date  . Acute upper respiratory infection 10/26/2016  . Anemia 03/13/2015  . ANEMIA, HX OF 01/20/2011  . Atypical chest pain 04/15/2012  . Back pain 07/22/2012  . Cellulitis of ankle 05/12/2011  . CHICKENPOX, HX OF 01/20/2011  . DEPRESSION 01/20/2011  . Elevated BP 04/15/2012  . Epistaxis 10/26/2016  . GERD 01/20/2011  . Hyperglycemia 05/10/2016  . Insomnia 12/12/2012  . LACERATION OF FINGER 01/27/2011  . Overweight 01/19/2016  . Preventative health care 02/28/2014  . RESTLESS LEG SYNDROME, HX OF 01/20/2011  . Sacroiliac joint pain 07/22/2012  . Sinusitis, acute  12/12/2012  . Tobacco abuse 05/12/2011  . 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 Marital status: Married    Spouse name: N/A  . Number of children: N/A  . Years of education: N/A   Occupational History  . Not on file.   Social History Main Topics  . Smoking status: Former Smoker    Packs/day: 0.50    Quit date: 04/12/2012  . Smokeless tobacco: Never Used  . Alcohol use 0.0 oz/week     Comment: 6-7 a week  . Drug use: No  . Sexual activity: Yes    Partners: Male     Comment: no dietary, lives with husband, wears seatbelt   Other Topics Concern  . Not on file   Social History Narrative  . No narrative on file    Past Surgical History:  Procedure Laterality Date  . lasik eye surgery X b/l      Family History  Problem Relation Age of Onset  . Hypertension Mother   . Arrhythmia Mother   . Lupus Sister   . Hypertension Brother   . Cancer Maternal Grandmother        breast  . Alzheimer's disease Maternal Grandmother   . Aneurysm Maternal Grandfather        brain  . Arthritis Maternal Grandfather        rheumatoid  . Nephrolithiasis Paternal Grandmother   . Heart attack Paternal Grandfather   . Anemia Brother     Allergies  Allergen  Reactions  . Erythromycin     REACTION: rash  . Penicillins     REACTION: swelling, rash  . Sulfonamide Derivatives     REACTION: GI upset    Current Outpatient Prescriptions on File Prior to Visit  Medication Sig Dispense Refill  . ALPRAZolam (XANAX) 0.25 MG tablet Take 1 tablet (0.25 mg total) by mouth 2 (two) times daily as needed for anxiety or sleep. 45 tablet 3  . cetirizine (ZYRTEC) 10 MG tablet Take 10 mg by mouth daily.    Marland Kitchen escitalopram (LEXAPRO) 20 MG tablet TAKE 1 TABLET (20 MG TOTAL) BY MOUTH DAILY. 30 tablet 5  . ibuprofen (ADVIL,MOTRIN) 200 MG tablet Take 200 mg by mouth every 6 (six) hours as needed.    Marland Kitchen omeprazole (PRILOSEC) 20 MG capsule Take 1 capsule (20 mg total) by  mouth daily. 90 capsule 1  . zolpidem (AMBIEN) 10 MG tablet Take 1 tablet (10 mg total) by mouth at bedtime as needed for sleep. 30 tablet 3  . busPIRone (BUSPAR) 10 MG tablet Take 1 tablet (10 mg total) by mouth 3 (three) times daily. (Patient not taking: Reported on 10/01/2017) 60 tablet 2   No current facility-administered medications on file prior to visit.     BP 127/82   Pulse (!) 58   Temp 98.4 F (36.9 C) (Oral)   Resp 16   Ht 5\' 7"  (1.702 m)   Wt 184 lb 9.6 oz (83.7 kg)   SpO2 99%   BMI 28.91 kg/m       Objective:   Physical Exam  General  Mental Status - Alert. General Appearance - Well groomed. Not in acute distress.Moderate nasal congestion.  Skin Rashes- No Rashes.  HEENT Head- Normal. Ear Auditory Canal - Left- Normal. Right - Normal.Tympanic Membrane- Left- Normal. Right- Normal. Eye Sclera/Conjunctiva- Left- Normal. Right- Normal. Nose & Sinuses Nasal Mucosa- Left-  Boggy and Congested. Right-  Boggy and  Congested.Bilateral  no maxillary and no frontal sinus pressure. Mouth & Throat Lips: Upper Lip- Normal: no dryness, cracking, pallor, cyanosis, or vesicular eruption. Lower Lip-Normal: no dryness, cracking, pallor, cyanosis or vesicular eruption. Buccal Mucosa- Bilateral- No Aphthous ulcers. Oropharynx- No Discharge or Erythema. Tonsils: Characteristics- Bilateral- No Erythema or Congestion. Size/Enlargement- Bilateral- No enlargement. Discharge- bilateral-None.  Neck Neck- Supple. No Masses.   Chest and Lung Exam Auscultation: Breath Sounds:-Clear even and unlabored.  Cardiovascular Auscultation:Rythm- Regular, rate and rhythm. Murmurs & Other Heart Sounds:Ausculatation of the heart reveal- No Murmurs.  Lymphatic Head & Neck General Head & Neck Lymphatics: Bilateral: Description- No Localized lymphadenopathy.       Assessment & Plan:  You appear to have bronchitis. Rest hydrate and tylenol for fever. Continue  cough medicine benzonatate   and prescribed azithromycin antibiotic. For your nasal congestion rx flonase.   You should gradually get better. If not then notify us and would recommend a chest xray.  Follow up in 7-10 days or as needed  Pt reassures me can take azithromycin no side effects. GI symptoms with emycin.  Note also I did make albuterol inhaler available in the event her mild wheezing this morning were to worsen. Also notes today I did not think her overall presentation indicated flu type presentation.

## 2017-10-04 NOTE — Telephone Encounter (Signed)
Done

## 2017-10-22 ENCOUNTER — Other Ambulatory Visit: Payer: Self-pay | Admitting: Family Medicine

## 2017-11-02 ENCOUNTER — Ambulatory Visit: Payer: BLUE CROSS/BLUE SHIELD | Admitting: Family Medicine

## 2017-12-08 DIAGNOSIS — R05 Cough: Secondary | ICD-10-CM | POA: Diagnosis not present

## 2017-12-08 DIAGNOSIS — J0140 Acute pansinusitis, unspecified: Secondary | ICD-10-CM | POA: Diagnosis not present

## 2018-01-09 DIAGNOSIS — R197 Diarrhea, unspecified: Secondary | ICD-10-CM | POA: Diagnosis not present

## 2018-01-09 DIAGNOSIS — R112 Nausea with vomiting, unspecified: Secondary | ICD-10-CM | POA: Diagnosis not present

## 2018-01-09 DIAGNOSIS — R509 Fever, unspecified: Secondary | ICD-10-CM | POA: Diagnosis not present

## 2018-01-14 ENCOUNTER — Other Ambulatory Visit: Payer: Self-pay | Admitting: Family Medicine

## 2018-02-21 DIAGNOSIS — Z6828 Body mass index (BMI) 28.0-28.9, adult: Secondary | ICD-10-CM | POA: Diagnosis not present

## 2018-02-21 DIAGNOSIS — Z01419 Encounter for gynecological examination (general) (routine) without abnormal findings: Secondary | ICD-10-CM | POA: Diagnosis not present

## 2018-02-21 DIAGNOSIS — Z1231 Encounter for screening mammogram for malignant neoplasm of breast: Secondary | ICD-10-CM | POA: Diagnosis not present

## 2018-02-21 LAB — HM MAMMOGRAPHY

## 2018-03-18 DIAGNOSIS — Z1329 Encounter for screening for other suspected endocrine disorder: Secondary | ICD-10-CM | POA: Diagnosis not present

## 2018-03-18 DIAGNOSIS — R102 Pelvic and perineal pain: Secondary | ICD-10-CM | POA: Diagnosis not present

## 2018-03-18 DIAGNOSIS — D259 Leiomyoma of uterus, unspecified: Secondary | ICD-10-CM | POA: Diagnosis not present

## 2018-03-18 DIAGNOSIS — Z13220 Encounter for screening for lipoid disorders: Secondary | ICD-10-CM | POA: Diagnosis not present

## 2018-03-18 DIAGNOSIS — Z131 Encounter for screening for diabetes mellitus: Secondary | ICD-10-CM | POA: Diagnosis not present

## 2018-03-18 DIAGNOSIS — Z1321 Encounter for screening for nutritional disorder: Secondary | ICD-10-CM | POA: Diagnosis not present

## 2018-06-27 ENCOUNTER — Encounter: Payer: Self-pay | Admitting: Family Medicine

## 2018-06-27 ENCOUNTER — Ambulatory Visit: Payer: BLUE CROSS/BLUE SHIELD | Admitting: Family Medicine

## 2018-06-27 VITALS — BP 102/78 | HR 72 | Temp 98.2°F | Resp 18 | Wt 179.2 lb

## 2018-06-27 DIAGNOSIS — F419 Anxiety disorder, unspecified: Secondary | ICD-10-CM

## 2018-06-27 DIAGNOSIS — B351 Tinea unguium: Secondary | ICD-10-CM

## 2018-06-27 DIAGNOSIS — R739 Hyperglycemia, unspecified: Secondary | ICD-10-CM | POA: Diagnosis not present

## 2018-06-27 DIAGNOSIS — M25512 Pain in left shoulder: Secondary | ICD-10-CM

## 2018-06-27 DIAGNOSIS — F418 Other specified anxiety disorders: Secondary | ICD-10-CM

## 2018-06-27 DIAGNOSIS — Z79899 Other long term (current) drug therapy: Secondary | ICD-10-CM

## 2018-06-27 DIAGNOSIS — E559 Vitamin D deficiency, unspecified: Secondary | ICD-10-CM

## 2018-06-27 DIAGNOSIS — G4700 Insomnia, unspecified: Secondary | ICD-10-CM

## 2018-06-27 DIAGNOSIS — G8929 Other chronic pain: Secondary | ICD-10-CM | POA: Diagnosis not present

## 2018-06-27 DIAGNOSIS — F329 Major depressive disorder, single episode, unspecified: Secondary | ICD-10-CM | POA: Diagnosis not present

## 2018-06-27 DIAGNOSIS — F32A Depression, unspecified: Secondary | ICD-10-CM

## 2018-06-27 DIAGNOSIS — Z0000 Encounter for general adult medical examination without abnormal findings: Secondary | ICD-10-CM

## 2018-06-27 MED ORDER — ZOLPIDEM TARTRATE 10 MG PO TABS: 10.0000 mg | ORAL_TABLET | Freq: Every evening | ORAL | 3 refills | Status: DC | PRN

## 2018-06-27 MED ORDER — TERBINAFINE HCL 250 MG PO TABS: 250.0000 mg | ORAL_TABLET | Freq: Every day | ORAL | 1 refills | Status: DC

## 2018-06-27 MED ORDER — ESCITALOPRAM OXALATE 20 MG PO TABS: 20.0000 mg | ORAL_TABLET | Freq: Every day | ORAL | 5 refills | Status: DC

## 2018-06-27 MED ORDER — OMEPRAZOLE 20 MG PO CPDR: DELAYED_RELEASE_CAPSULE | ORAL | 0 refills | Status: DC

## 2018-06-27 MED ORDER — ALPRAZOLAM 0.25 MG PO TABS: 0.2500 mg | ORAL_TABLET | Freq: Two times a day (BID) | ORAL | 3 refills | Status: DC | PRN

## 2018-06-27 NOTE — Progress Notes (Signed)
Subjective:  I acted as a Education administrator for Dr. Charlett Blake. [place], [place]  Patient ID: Samantha Morales, female    DOB: 14-Nov-1971, 47 y.o.   MRN: [id number]  No chief complaint on file.   HPI  Patient is in today for medication follow up and overall is doing well.  Continues to no recent falls or trauma.  No radicular symptoms.  She also has stiffness and pain in her right thumb which is ongoing but no redness or warmth.  No recent febrile illness or acute concerns.  Is doing well on Lexapro and needs her alprazolam infrequently.  Has good results when she takes it. Denies CP/palp/SOB/HA/congestion/fevers/GI or GU c/o. Taking meds as prescribed  Patient Care Team: Mosie Lukes, MD as PCP - General (Family Medicine) Marylynn Pearson, MD as Consulting Physician (Obstetrics and Gynecology)   Past Medical History:  Diagnosis Date  . Acute upper respiratory infection 10/26/2016  . Anemia 03/13/2015  . ANEMIA, HX OF 01/20/2011  . Atypical chest pain 04/15/2012  . Back pain 07/22/2012  . Cellulitis of ankle 05/12/2011  . CHICKENPOX, HX OF 01/20/2011  . DEPRESSION 01/20/2011  . Elevated BP 04/15/2012  . Epistaxis 10/26/2016  . GERD 01/20/2011  . Hyperglycemia 05/10/2016  . Insomnia 12/12/2012  . LACERATION OF FINGER 01/27/2011  . Overweight 01/19/2016  . Preventative health care 02/28/2014  . RESTLESS LEG SYNDROME, HX OF 01/20/2011  . Sacroiliac joint pain 07/22/2012  . Sinusitis, acute 12/12/2012  . Tobacco abuse 05/12/2011  . 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 lasik eye surgery X b/l      Family History  Problem Relation Age of Onset  . Hypertension Mother   . Arrhythmia Mother   . Lupus Sister   . Hypertension Brother   . Cancer Maternal Grandmother        breast  . Alzheimer's disease Maternal Grandmother   . Aneurysm Maternal Grandfather        brain  . Arthritis Maternal Grandfather        rheumatoid  . Nephrolithiasis  Paternal Grandmother   . Heart attack Paternal Grandfather   . Anemia Brother     Social History   Socioeconomic History  . Marital status: Married    Spouse name: Not on file  . Number of children: Not on file  . Years of education: Not on file  . Highest education level: Not on file  Occupational History  . Not on file  Social Needs  . Financial resource strain: Not on file  . Food insecurity:    Worry: Not on file    Inability: Not on file  . Transportation needs:    Medical: Not on file    Non-medical: Not on file  Tobacco Use  . Smoking status: Former Smoker    Packs/day: 0.50    Last attempt to quit: 04/12/2012    Years since quitting: 6.2  . Smokeless tobacco: Never Used  Substance and Sexual Activity  . Alcohol use: Yes    Alcohol/week: 0.0 oz    Comment: 6-7 a week  . Drug use: No  . Sexual activity: Yes    Partners: Male    Comment: no dietary, lives with husband, wears seatbelt  Lifestyle  . Physical activity:    Days per week: Not on file    Minutes per session: Not on file  . Stress: Not on file  Relationships  . Social connections:  Talks on phone: Not on file    Gets together: Not on file    Attends religious service: Not on file    Active member of club or organization: Not on file    Attends meetings of clubs or organizations: Not on file    Relationship status: Not on file  . Intimate partner violence:    Fear of current or ex partner: Not on file    Emotionally abused: Not on file    Physically abused: Not on file    Forced sexual activity: Not on file  Other Topics Concern  . Not on file  Social History Narrative  . Not on file    Outpatient Medications Prior to Visit  Medication Sig Dispense Refill  . cetirizine (ZYRTEC) 10 MG tablet Take 10 mg by mouth daily.    . fluticasone (FLONASE) 50 MCG/ACT nasal spray Place 2 sprays into both nostrils daily. 16 g 1  . ibuprofen (ADVIL,MOTRIN) 200 MG tablet Take 200 mg by mouth every 6  (six) hours as needed.    Marland Kitchen albuterol (PROVENTIL HFA;VENTOLIN HFA) 108 (90 Base) MCG/ACT inhaler Inhale 2 puffs into the lungs every 6 (six) hours as needed for wheezing or shortness of breath. 1 Inhaler 0  . ALPRAZolam (XANAX) 0.25 MG tablet Take 1 tablet (0.25 mg total) by mouth 2 (two) times daily as needed for anxiety or sleep. 45 tablet 3  . azithromycin (ZITHROMAX) 250 MG tablet Take 2 tablets by mouth on day 1, followed by 1 tablet by mouth daily for 4 days. 6 tablet 0  . escitalopram (LEXAPRO) 20 MG tablet TAKE 1 TABLET (20 MG TOTAL) BY MOUTH DAILY. 30 tablet 5  . omeprazole (PRILOSEC) 20 MG capsule Take 1 capsule (20 mg total) by mouth daily. 90 capsule 1  . omeprazole (PRILOSEC) 20 MG capsule TAKE 1 CAPSULE BY MOUTH EVERY DAY 90 capsule 0  . zolpidem (AMBIEN) 10 MG tablet Take 1 tablet (10 mg total) by mouth at bedtime as needed for sleep. 30 tablet 3  . busPIRone (BUSPAR) 10 MG tablet Take 1 tablet (10 mg total) by mouth 3 (three) times daily. (Patient not taking: Reported on 10/01/2017) 60 tablet 2   No facility-administered medications prior to visit.     Allergies  Allergen Reactions  . Erythromycin     REACTION: rash  . Penicillins     REACTION: swelling, rash  . Sulfonamide Derivatives     REACTION: GI upset    Review of Systems  Constitutional: Negative for fever and malaise/fatigue.  HENT: Negative for congestion.   Eyes: Negative for blurred vision.  Respiratory: Negative for shortness of breath.   Cardiovascular: Negative for chest pain, palpitations and leg swelling.  Gastrointestinal: Negative for abdominal pain, blood in stool and nausea.  Genitourinary: Negative for dysuria and frequency.  Musculoskeletal: Positive for joint pain. Negative for falls.  Skin: Negative for rash.  Neurological: Negative for dizziness, loss of consciousness and headaches.  Endo/Heme/Allergies: Negative for environmental allergies.  Psychiatric/Behavioral: Negative for depression.  The patient is not nervous/anxious.        Objective:    Physical Exam  Constitutional: She is oriented to person, place, and time. She appears well-developed and well-nourished. No distress.  HENT:  Head: Normocephalic and atraumatic.  Nose: Nose normal.  Eyes: Right eye exhibits no discharge. Left eye exhibits no discharge.  Neck: Normal range of motion. Neck supple.  Cardiovascular: Normal rate and regular rhythm.  No murmur heard. Pulmonary/Chest: Effort normal  and breath sounds normal.  Abdominal: Soft. Bowel sounds are normal. There is no tenderness.  Musculoskeletal: She exhibits no edema.  Neurological: She is alert and oriented to person, place, and time.  Skin: Skin is warm and dry.  Psychiatric: She has a normal mood and affect.  Nursing note and vitals reviewed.   BP 102/78 (BP Location: Left Arm, Patient Position: Sitting, Cuff Size: Normal)   Pulse 72   Temp 98.2 F (36.8 C) (Oral)   Resp 18   Wt 179 lb 3.2 oz (81.3 kg)   SpO2 98%   BMI 28.07 kg/m  Wt Readings from Last 3 Encounters:  06/27/18 179 lb 3.2 oz (81.3 kg)  10/01/17 184 lb 9.6 oz (83.7 kg)  09/09/17 180 lb 8 oz (81.9 kg)   BP Readings from Last 3 Encounters:  06/27/18 102/78  10/01/17 127/82  09/09/17 118/72     Immunization History  Administered Date(s) Administered  . Influenza Split 09/27/2000, 12/08/2011  . Influenza Whole 10/21/2013  . Influenza,inj,Quad PF,6+ Mos 08/28/2014, 10/07/2015  . Influenza-Unspecified 08/28/2016, 10/29/2017  . Td 01/27/2011  . Tdap 04/23/2016    Health Maintenance  Topic Date Due  . HIV Screening  06/14/1986  . INFLUENZA VACCINE  07/28/2018  . PAP SMEAR  07/29/2018  . TETANUS/TDAP  04/23/2026    Lab Results  Component Value Date   WBC 9.2 01/18/2017   HGB 11.6 (L) 01/18/2017   HCT 34.8 (L) 01/18/2017   PLT 292.0 01/18/2017   GLUCOSE 95 04/23/2016   CHOL 151 04/23/2016   TRIG 54.0 04/23/2016   HDL 82.80 04/23/2016   LDLCALC 58 04/23/2016    ALT 12 04/23/2016   AST 15 04/23/2016   NA 137 04/23/2016   K 4.6 04/23/2016   CL 105 04/23/2016   CREATININE 0.81 04/23/2016   BUN 11 04/23/2016   CO2 26 04/23/2016   TSH 2.00 04/23/2016   INR 1.0 12/05/2015   HGBA1C 5.4 04/23/2016    Lab Results  Component Value Date   TSH 2.00 04/23/2016   Lab Results  Component Value Date   WBC 9.2 01/18/2017   HGB 11.6 (L) 01/18/2017   HCT 34.8 (L) 01/18/2017   MCV 87.9 01/18/2017   PLT 292.0 01/18/2017   Lab Results  Component Value Date   NA 137 04/23/2016   K 4.6 04/23/2016   CO2 26 04/23/2016   GLUCOSE 95 04/23/2016   BUN 11 04/23/2016   CREATININE 0.81 04/23/2016   BILITOT 0.5 04/23/2016   ALKPHOS 47 04/23/2016   AST 15 04/23/2016   ALT 12 04/23/2016   PROT 7.4 04/23/2016   ALBUMIN 4.1 04/23/2016   CALCIUM 9.2 04/23/2016   GFR 81.32 04/23/2016   Lab Results  Component Value Date   CHOL 151 04/23/2016   Lab Results  Component Value Date   HDL 82.80 04/23/2016   Lab Results  Component Value Date   LDLCALC 58 04/23/2016   Lab Results  Component Value Date   TRIG 54.0 04/23/2016   Lab Results  Component Value Date   CHOLHDL 2 04/23/2016   Lab Results  Component Value Date   HGBA1C 5.4 04/23/2016         Assessment & Plan:   Problem List Items Addressed This Visit    Depression with anxiety    Doing well on Lexapro most days but does need intermittent dosing of Alprazolam, UDS and contract UTD refills allowed. Patient using med appropriately.      Relevant Medications  ALPRAZolam (XANAX) 0.25 MG tablet   escitalopram (LEXAPRO) 20 MG tablet   Insomnia    Encouraged good sleep hygiene such as dark, quiet room. No blue/green glowing lights such as computer screens in bedroom. No alcohol or stimulants in evening. Cut down on caffeine as able. Regular exercise is helpful but not just prior to bed time. May continue Ambien prn      Preventative health care   Vitamin D deficiency    Daily  supplements      Relevant Orders   Comp Met (CMET)   Hyperglycemia    hgba1c acceptable, minimize simple carbs. Increase exercise as tolerated.       Onychomycosis    Started on Lamisil check liver functions in 4 weeks.       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 Encouraged moist heat and gentle stretching as tolerated. May try NSAIDs and prescription meds as directed and report if symptoms worsen or seek immediate care. Try topical lidocaine and bid Tylenol.       Relevant Medications   escitalopram (LEXAPRO) 20 MG tablet   Other Relevant Orders   DG Shoulder Left    Other Visit Diagnoses    High risk medication use    -  Primary   Relevant Orders   Pain Mgmt, Profile 8 w/Conf, U (Completed)   Anxiety and depression       Relevant Medications   ALPRAZolam (XANAX) 0.25 MG tablet   escitalopram (LEXAPRO) 20 MG tablet      I have discontinued Wells Guiles B. Hartmann's busPIRone, omeprazole, azithromycin, and albuterol. I have also changed her omeprazole. Additionally, I am having her start on terbinafine. Lastly, I am having her maintain her ibuprofen, cetirizine, fluticasone, ALPRAZolam, zolpidem, and escitalopram.  Meds ordered this encounter  Medications  . ALPRAZolam (XANAX) 0.25 MG tablet    Sig: Take 1 tablet (0.25 mg total) by mouth 2 (two) times daily as needed for anxiety or sleep.    Dispense:  45 tablet    Refill:  3  . zolpidem (AMBIEN) 10 MG tablet    Sig: Take 1 tablet (10 mg total) by mouth at bedtime as needed for sleep.    Dispense:  30 tablet    Refill:  3  . omeprazole (PRILOSEC) 20 MG capsule    Sig: 90TAKE 1 CAPSULE BY MOUTH EVERY DAY    Dispense:  90 capsule    Refill:  0  . escitalopram (LEXAPRO) 20 MG tablet    Sig: Take 1 tablet (20 mg total) by mouth daily.    Dispense:  30 tablet    Refill:  5  . terbinafine  (LAMISIL) 250 MG tablet    Sig: Take 1 tablet (250 mg total) by mouth daily.    Dispense:  30 tablet    Refill:  1    CMA served as scribe during this visit. History, Physical and Plan performed by medical provider. Documentation and orders reviewed and attested to.  Penni Homans, MD

## 2018-06-27 NOTE — Assessment & Plan Note (Signed)
hgba1c acceptable, minimize simple carbs. Increase exercise as tolerated.  

## 2018-06-30 LAB — PAIN MGMT, PROFILE 8 W/CONF, U
6 Acetylmorphine: NEGATIVE ng/mL (ref <10)
Alcohol Metabolites: POSITIVE ng/mL — AB (ref <500)
Amphetamines: NEGATIVE ng/mL (ref <500)
Benzodiazepines: NEGATIVE ng/mL (ref <100)
Buprenorphine, Urine: NEGATIVE ng/mL (ref <5)
Cocaine Metabolite: NEGATIVE ng/mL (ref <150)
Creatinine: 73.4 mg/dL
Ethyl Glucuronide (ETG): 53922 ng/mL — ABNORMAL HIGH (ref <500)
Ethyl Sulfate (ETS): 4697 ng/mL — ABNORMAL HIGH (ref <100)
MDMA: NEGATIVE ng/mL (ref <500)
Marijuana Metabolite: NEGATIVE ng/mL (ref <20)
Opiates: NEGATIVE ng/mL (ref <100)
Oxidant: NEGATIVE ug/mL (ref <200)
Oxycodone: NEGATIVE ng/mL (ref <100)
pH: 5.86 (ref 4.5–9.0)

## 2018-07-03 DIAGNOSIS — M25512 Pain in left shoulder: Secondary | ICD-10-CM

## 2018-07-03 DIAGNOSIS — G8929 Other chronic pain: Secondary | ICD-10-CM | POA: Insufficient documentation

## 2018-07-03 DIAGNOSIS — B351 Tinea unguium: Secondary | ICD-10-CM | POA: Insufficient documentation

## 2018-07-03 NOTE — Assessment & Plan Note (Signed)
Daily supplements 

## 2018-07-03 NOTE — Assessment & Plan Note (Signed)
Doing well on Lexapro most days but does need intermittent dosing of Alprazolam, UDS and contract UTD refills allowed. Patient using med appropriately.

## 2018-07-03 NOTE — Assessment & Plan Note (Signed)
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 Encouraged moist heat and gentle stretching as tolerated. May try NSAIDs and prescription meds as directed and report if symptoms worsen or seek immediate care. Try topical lidocaine and bid Tylenol.

## 2018-07-03 NOTE — Assessment & Plan Note (Signed)
Encouraged good sleep hygiene such as dark, quiet room. No blue/green glowing lights such as computer screens in bedroom. No alcohol or stimulants in evening. Cut down on caffeine as able. Regular exercise is helpful but not just prior to bed time. May continue Ambien prn

## 2018-07-03 NOTE — Assessment & Plan Note (Signed)
Started on Lamisil check liver functions in 4 weeks.

## 2018-07-04 ENCOUNTER — Telehealth: Payer: Self-pay

## 2018-07-04 DIAGNOSIS — Z0000 Encounter for general adult medical examination without abnormal findings: Secondary | ICD-10-CM

## 2018-07-04 DIAGNOSIS — R748 Abnormal levels of other serum enzymes: Secondary | ICD-10-CM

## 2018-07-04 NOTE — Telephone Encounter (Signed)
Author phoned pt. to relay Dr. Frederik Pear message [facility]:IDUPB drug screen. Pt. moderate risk, updated in FYI. Pt. has lab appointment 8/1 for "vitamin D level and to check my liver because I'm on that anti-fungal for my nails", but no lab orders found. Pt. requested to have urine drug screen added onto those labs. Author routed to Dr. Charlett Blake for recommendation and to place orders.

## 2018-07-04 NOTE — Telephone Encounter (Signed)
-----   Message from Mosie Lukes, MD sent at 06/30/2018  6:00 PM EDT ----- Notify labs look good, except alcohol in test. Moderate risk until tests negative for alcohol in future

## 2018-07-04 NOTE — Telephone Encounter (Signed)
Please order her UDS and lft's in one month and let her know

## 2018-07-05 NOTE — Telephone Encounter (Signed)
Author phoned pt. Re: new orders per DrMarland Kitchen Charlett Blake, and need for lab appointment in one month. No answer, detailed VM left with call back [phone]. OK for [facility] to schedule lab appointment for August for urine drug screen and liver function tests as ordered.

## 2018-07-08 ENCOUNTER — Telehealth: Payer: Self-pay | Admitting: *Deleted

## 2018-07-08 NOTE — Telephone Encounter (Signed)
Received Medical records from [facility]; forwarded to provider/SLS 07/12

## 2018-07-14 ENCOUNTER — Encounter: Payer: Self-pay | Admitting: Family Medicine

## 2018-07-14 LAB — HM PAP SMEAR: HM Pap smear: NEGATIVE

## 2018-07-27 ENCOUNTER — Ambulatory Visit (INDEPENDENT_AMBULATORY_CARE_PROVIDER_SITE_OTHER)
Admission: RE | Admit: 2018-07-27 | Discharge: 2018-07-27 | Disposition: A | Discharge status: Home / Self Care | Payer: BLUE CROSS/BLUE SHIELD | Source: Ambulatory Visit | Attending: Family Medicine | Admitting: Family Medicine

## 2018-07-27 DIAGNOSIS — G8929 Other chronic pain: Secondary | ICD-10-CM

## 2018-07-27 DIAGNOSIS — M25512 Pain in left shoulder: Secondary | ICD-10-CM | POA: Diagnosis not present

## 2018-07-28 ENCOUNTER — Other Ambulatory Visit (INDEPENDENT_AMBULATORY_CARE_PROVIDER_SITE_OTHER): Payer: BLUE CROSS/BLUE SHIELD

## 2018-07-28 DIAGNOSIS — R748 Abnormal levels of other serum enzymes: Secondary | ICD-10-CM

## 2018-07-28 DIAGNOSIS — Z0000 Encounter for general adult medical examination without abnormal findings: Secondary | ICD-10-CM

## 2018-07-28 DIAGNOSIS — E559 Vitamin D deficiency, unspecified: Secondary | ICD-10-CM | POA: Diagnosis not present

## 2018-07-28 LAB — HEPATIC FUNCTION PANEL
ALT: 15 U/L (ref 0–35)
AST: 16 U/L (ref 0–37)
Albumin: 4.3 g/dL (ref 3.5–5.2)
Alkaline Phosphatase: 54 U/L (ref 39–117)
Bilirubin, Direct: 0.1 mg/dL (ref 0.0–0.3)
Total Bilirubin: 0.6 mg/dL (ref 0.2–1.2)
Total Protein: 7 g/dL (ref 6.0–8.3)

## 2018-07-28 LAB — COMPREHENSIVE METABOLIC PANEL
ALT: 15 U/L (ref 0–35)
AST: 16 U/L (ref 0–37)
Albumin: 4.3 g/dL (ref 3.5–5.2)
Alkaline Phosphatase: 54 U/L (ref 39–117)
BUN: 16 mg/dL (ref 6–23)
CO2: 30 mEq/L (ref 19–32)
Calcium: 9.4 mg/dL (ref 8.4–10.5)
Chloride: 103 mEq/L (ref 96–112)
Creatinine, Ser: 0.75 mg/dL (ref 0.40–1.20)
GFR: 87.99 mL/min (ref >60.00)
Glucose, Bld: 93 mg/dL (ref 70–99)
Potassium: 4.3 mEq/L (ref 3.5–5.1)
Sodium: 138 mEq/L (ref 135–145)
Total Bilirubin: 0.6 mg/dL (ref 0.2–1.2)
Total Protein: 7 g/dL (ref 6.0–8.3)

## 2018-07-29 LAB — URINE DRUGS OF ABUSE SCREEN W ALC, ROUTINE (REF LAB)
ALCOHOL, ETHYL (U): NEGATIVE
AMPHETAMINES (1000 ng/mL SCRN): NEGATIVE
BARBITURATES: NEGATIVE
BENZODIAZEPINES: NEGATIVE
COCAINE METABOLITES: NEGATIVE
MARIJUANA MET (50 ng/mL SCRN): NEGATIVE
METHADONE: NEGATIVE
METHAQUALONE: NEGATIVE
OPIATES: NEGATIVE
PHENCYCLIDINE: NEGATIVE
PROPOXYPHENE: NEGATIVE

## 2018-08-10 ENCOUNTER — Other Ambulatory Visit: Payer: Self-pay | Admitting: Family Medicine

## 2018-10-05 ENCOUNTER — Other Ambulatory Visit: Payer: Self-pay | Admitting: Family Medicine

## 2018-10-12 DIAGNOSIS — H5213 Myopia, bilateral: Secondary | ICD-10-CM | POA: Diagnosis not present

## 2018-11-07 ENCOUNTER — Telehealth: Payer: Self-pay | Admitting: Family Medicine

## 2018-11-07 NOTE — Telephone Encounter (Signed)
Copied from [place] [id number]. Topic: Quick Communication - See Telephone Encounter >> Nov 07, 2018  4:16 PM Genella Rife H wrote: CRM for notification. See Telephone encounter for: 11/07/18.  Left voicemail appt needs to be rescheduled per pcp

## 2018-12-29 ENCOUNTER — Encounter: Payer: BLUE CROSS/BLUE SHIELD | Admitting: Family Medicine

## 2019-01-03 ENCOUNTER — Other Ambulatory Visit: Payer: Self-pay | Admitting: Family Medicine

## 2019-01-04 ENCOUNTER — Other Ambulatory Visit: Payer: Self-pay | Admitting: Family Medicine

## 2019-01-04 MED ORDER — ZOLPIDEM TARTRATE 10 MG PO TABS: 10.0000 mg | ORAL_TABLET | Freq: Every evening | ORAL | 3 refills | Status: DC | PRN

## 2019-01-04 MED ORDER — ALPRAZOLAM 0.25 MG PO TABS: 0.2500 mg | ORAL_TABLET | Freq: Two times a day (BID) | ORAL | 3 refills | Status: DC | PRN

## 2019-01-04 NOTE — Telephone Encounter (Signed)
Copied from [place] [id number]. Topic: Quick Communication - Rx Refill/Question >> Jan 04, 2019 11:48 AM Yvette Rack wrote: Medication: ALPRAZolam (XANAX) 0.25 MG tablet and zolpidem (AMBIEN) 10 MG tablet  Has the patient contacted their pharmacy? yes   Preferred Pharmacy (with phone number or street name[id number] [place] [address] [phone] (Phone)  [phone] (Fax)  Agent: Please be advised that RX refills may take up to 3 business days. We ask that you follow-up with your pharmacy.

## 2019-01-15 DIAGNOSIS — S61213A Laceration without foreign body of left middle finger without damage to nail, initial encounter: Secondary | ICD-10-CM | POA: Diagnosis not present

## 2019-01-26 ENCOUNTER — Encounter: Payer: BLUE CROSS/BLUE SHIELD | Admitting: Family Medicine

## 2019-02-10 ENCOUNTER — Other Ambulatory Visit: Payer: Self-pay | Admitting: Family

## 2019-02-21 LAB — HM PAP SMEAR: HM Pap smear: NEGATIVE

## 2019-02-28 ENCOUNTER — Ambulatory Visit (INDEPENDENT_AMBULATORY_CARE_PROVIDER_SITE_OTHER): Payer: BLUE CROSS/BLUE SHIELD | Admitting: Family Medicine

## 2019-02-28 ENCOUNTER — Encounter: Payer: Self-pay | Admitting: Family Medicine

## 2019-02-28 ENCOUNTER — Other Ambulatory Visit: Payer: Self-pay

## 2019-02-28 VITALS — BP 100/68 | HR 71 | Temp 98.2°F | Resp 18 | Ht 67.0 in | Wt 186.2 lb

## 2019-02-28 DIAGNOSIS — E559 Vitamin D deficiency, unspecified: Secondary | ICD-10-CM

## 2019-02-28 DIAGNOSIS — Z0000 Encounter for general adult medical examination without abnormal findings: Secondary | ICD-10-CM

## 2019-02-28 DIAGNOSIS — K219 Gastro-esophageal reflux disease without esophagitis: Secondary | ICD-10-CM

## 2019-02-28 DIAGNOSIS — M999 Biomechanical lesion, unspecified: Secondary | ICD-10-CM

## 2019-02-28 DIAGNOSIS — R739 Hyperglycemia, unspecified: Secondary | ICD-10-CM

## 2019-02-28 DIAGNOSIS — G4700 Insomnia, unspecified: Secondary | ICD-10-CM

## 2019-02-28 MED ORDER — ALPRAZOLAM 0.25 MG PO TABS: 0.2500 mg | ORAL_TABLET | Freq: Two times a day (BID) | ORAL | 3 refills | Status: DC | PRN

## 2019-02-28 NOTE — Patient Instructions (Addendum)
Chiropractor  Preventive Care 40-64 Years, Female Preventive care refers to lifestyle choices and visits with your health care provider that can promote health and wellness. What does preventive care include?   A yearly physical exam. This is also called an annual well check.  Dental exams once or twice a year.  Routine eye exams. Ask your health care provider how often you should have your eyes checked.  Personal lifestyle choices, including: ? Daily care of your teeth and gums. ? Regular physical activity. ? Eating a healthy diet. ? Avoiding tobacco and drug use. ? Limiting alcohol use. ? Practicing safe sex. ? Taking low-dose aspirin daily starting at age 32. ? Taking vitamin and mineral supplements as recommended by your health care provider. What happens during an annual well check? The services and screenings done by your health care provider during your annual well check will depend on your age, overall health, lifestyle risk factors, and family history of disease. Counseling Your health care provider may ask you questions about your:  Alcohol use.  Tobacco use.  Drug use.  Emotional well-being.  Home and relationship well-being.  Sexual activity.  Eating habits.  Work and work Statistician.  Method of birth control.  Menstrual cycle.  Pregnancy history. Screening You may have the following tests or measurements:  Height, weight, and BMI.  Blood pressure.  Lipid and cholesterol levels. These may be checked every 5 years, or more frequently if you are over 48 years old.  Skin check.  Lung cancer screening. You may have this screening every year starting at age 98 if you have a 30-pack-year history of smoking and currently smoke or have quit within the past 15 years.  Colorectal cancer screening. All adults should have this screening starting at age 36 and continuing until age 71. Your health care provider may recommend screening at age 53. You will  have tests every 1-10 years, depending on your results and the type of screening test. People at increased risk should start screening at an earlier age. Screening tests may include: ? Guaiac-based fecal occult blood testing. ? Fecal immunochemical test (FIT). ? Stool DNA test. ? Virtual colonoscopy. ? Sigmoidoscopy. During this test, a flexible tube with a tiny camera (sigmoidoscope) is used to examine your rectum and lower colon. The sigmoidoscope is inserted through your anus into your rectum and lower colon. ? Colonoscopy. During this test, a long, thin, flexible tube with a tiny camera (colonoscope) is used to examine your entire colon and rectum.  Hepatitis C blood test.  Hepatitis B blood test.  Sexually transmitted disease (STD) testing.  Diabetes screening. This is done by checking your blood sugar (glucose) after you have not eaten for a while (fasting). You may have this done every 1-3 years.  Mammogram. This may be done every 1-2 years. Talk to your health care provider about when you should start having regular mammograms. This may depend on whether you have a family history of breast cancer.  BRCA-related cancer screening. This may be done if you have a family history of breast, ovarian, tubal, or peritoneal cancers.  Pelvic exam and Pap test. This may be done every 3 years starting at age 32. Starting at age 17, this may be done every 5 years if you have a Pap test in combination with an HPV test.  Bone density scan. This is done to screen for osteoporosis. You may have this scan if you are at high risk for osteoporosis. Discuss your test results,  treatment options, and if necessary, the need for more tests with your health care provider. Vaccines Your health care provider may recommend certain vaccines, such as:  Influenza vaccine. This is recommended every year.  Tetanus, diphtheria, and acellular pertussis (Tdap, Td) vaccine. You may need a Td booster every 10  years.  Varicella vaccine. You may need this if you have not been vaccinated.  Zoster vaccine. You may need this after age 3.  Measles, mumps, and rubella (MMR) vaccine. You may need at least one dose of MMR if you were born in 1957 or later. You may also need a second dose.  Pneumococcal 13-valent conjugate (PCV13) vaccine. You may need this if you have certain conditions and were not previously vaccinated.  Pneumococcal polysaccharide (PPSV23) vaccine. You may need one or two doses if you smoke cigarettes or if you have certain conditions.  Meningococcal vaccine. You may need this if you have certain conditions.  Hepatitis A vaccine. You may need this if you have certain conditions or if you travel or work in places where you may be exposed to hepatitis A.  Hepatitis B vaccine. You may need this if you have certain conditions or if you travel or work in places where you may be exposed to hepatitis B.  Haemophilus influenzae type b (Hib) vaccine. You may need this if you have certain conditions. Talk to your health care provider about which screenings and vaccines you need and how often you need them. This information is not intended to replace advice given to you by your health care provider. Make sure you discuss any questions you have with your health care provider. Document Released: 01/10/2016 Document Revised: 02/03/2018 Document Reviewed: 10/15/2015 Elsevier Interactive Patient Education  2019 [employer].

## 2019-03-01 LAB — COMPREHENSIVE METABOLIC PANEL
ALT: 16 U/L (ref 0–35)
AST: 20 U/L (ref 0–37)
Albumin: 4.4 g/dL (ref 3.5–5.2)
Alkaline Phosphatase: 72 U/L (ref 39–117)
BUN: 15 mg/dL (ref 6–23)
CO2: 27 mEq/L (ref 19–32)
Calcium: 9 mg/dL (ref 8.4–10.5)
Chloride: 104 mEq/L (ref 96–112)
Creatinine, Ser: 0.75 mg/dL (ref 0.40–1.20)
GFR: 82.58 mL/min (ref >60.00)
Glucose, Bld: 91 mg/dL (ref 70–99)
Potassium: 4.3 mEq/L (ref 3.5–5.1)
Sodium: 137 mEq/L (ref 135–145)
Total Bilirubin: 0.5 mg/dL (ref 0.2–1.2)
Total Protein: 7 g/dL (ref 6.0–8.3)

## 2019-03-01 LAB — CBC
HCT: 36.4 % (ref 36.0–46.0)
Hemoglobin: 12.2 g/dL (ref 12.0–15.0)
MCHC: 33.4 g/dL (ref 30.0–36.0)
MCV: 96.2 fl (ref 78.0–100.0)
Platelets: 261 10*3/uL (ref 150.0–400.0)
RBC: 3.79 Mil/uL — ABNORMAL LOW (ref 3.87–5.11)
RDW: 16 % — ABNORMAL HIGH (ref 11.5–15.5)
WBC: 8.1 10*3/uL (ref 4.0–10.5)

## 2019-03-01 LAB — LIPID PANEL
Cholesterol: 151 mg/dL (ref 0–200)
HDL: 83.5 mg/dL (ref >39.00)
LDL Cholesterol: 47 mg/dL (ref 0–99)
NonHDL: 67.7
Total CHOL/HDL Ratio: 2
Triglycerides: 105 mg/dL (ref 0.0–149.0)
VLDL: 21 mg/dL (ref 0.0–40.0)

## 2019-03-01 LAB — VITAMIN D 25 HYDROXY (VIT D DEFICIENCY, FRACTURES): VITD: 34.47 ng/mL (ref 30.00–100.00)

## 2019-03-01 LAB — TSH: TSH: 1.04 u[IU]/mL (ref 0.35–4.50)

## 2019-03-01 NOTE — Assessment & Plan Note (Signed)
hgba1c acceptable, minimize simple carbs. Increase exercise as tolerated. Continue current meds 

## 2019-03-01 NOTE — Assessment & Plan Note (Signed)
Patient encouraged to maintain heart healthy diet, regular exercise, adequate sleep. Consider daily probiotics. Take medications as prescribed. Labs reviewed 

## 2019-03-01 NOTE — Progress Notes (Signed)
Subjective:    Patient ID: Samantha Morales, female    DOB: January 03, 1971, 48 y.o.   MRN: [id number]  No chief complaint on file.   HPI Patient is in today for annual preventative exam she feels well today. No recent febrile illness or hospitalizations. No recent fall or trauma. She does work as a Merchandiser, retail. She notes she has been having pain and stiffness in her left neck, left shoulder and into her arm at times. She is managing her activities of daily living. Denies CP/palp/SOB/HA/congestion/fevers/GI or GU c/o. Taking meds as prescribed  Past Medical History:  Diagnosis Date  . Acute upper respiratory infection 10/26/2016  . Anemia 03/13/2015  . ANEMIA, HX OF 01/20/2011  . Atypical chest pain 04/15/2012  . Back pain 07/22/2012  . Cellulitis of ankle 05/12/2011  . CHICKENPOX, HX OF 01/20/2011  . DEPRESSION 01/20/2011  . Elevated BP 04/15/2012  . Epistaxis 10/26/2016  . GERD 01/20/2011  . Hyperglycemia 05/10/2016  . Insomnia 12/12/2012  . LACERATION OF FINGER 01/27/2011  . Overweight 01/19/2016  . Preventative health care 02/28/2014  . RESTLESS LEG SYNDROME, HX OF 01/20/2011  . Sacroiliac joint pain 07/22/2012  . Sinusitis, acute 12/12/2012  . Tobacco abuse 05/12/2011  . 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 lasik eye surgery X b/l      Family History  Problem Relation Age of Onset  . Hypertension Mother   . Arrhythmia Mother   . Lupus Sister   . Hypertension Brother   . Epilepsy Brother   . Cancer Maternal Grandmother        breast  . Alzheimer's disease Maternal Grandmother   . Aneurysm Maternal Grandfather        brain  . Arthritis Maternal Grandfather        rheumatoid  . Nephrolithiasis Paternal Grandmother   . Heart attack Paternal Grandfather   . Anemia Brother     Social History   Socioeconomic History  . Marital status: Married    Spouse name: Not on file  . Number of children: Not on file  . Years of  education: Not on file  . Highest education level: Not on file  Occupational History  . Not on file  Social Needs  . Financial resource strain: Not on file  . Food insecurity:    Worry: Not on file    Inability: Not on file  . Transportation needs:    Medical: Not on file    Non-medical: Not on file  Tobacco Use  . Smoking status: Former Smoker    Packs/day: 0.50    Last attempt to quit: 04/12/2012    Years since quitting: 6.8  . Smokeless tobacco: Never Used  Substance and Sexual Activity  . Alcohol use: Yes    Alcohol/week: 0.0 standard drinks    Comment: 6-7 a week  . Drug use: No  . Sexual activity: Yes    Partners: Male    Comment: no dietary, lives with husband, wears seatbelt  Lifestyle  . Physical activity:    Days per week: Not on file    Minutes per session: Not on file  . Stress: Not on file  Relationships  . Social connections:    Talks on phone: Not on file    Gets together: Not on file    Attends religious service: Not on file    Active member of club or organization: Not on file  Attends meetings of clubs or organizations: Not on file    Relationship status: Not on file  . Intimate partner violence:    Fear of current or ex partner: Not on file    Emotionally abused: Not on file    Physically abused: Not on file    Forced sexual activity: Not on file  Other Topics Concern  . Not on file  Social History Narrative  . Not on file    Outpatient Medications Prior to Visit  Medication Sig Dispense Refill  . cetirizine (ZYRTEC) 10 MG tablet Take 10 mg by mouth daily.    Marland Kitchen escitalopram (LEXAPRO) 20 MG tablet TAKE 1 TABLET BY MOUTH EVERY DAY 90 tablet 1  . ibuprofen (ADVIL,MOTRIN) 200 MG tablet Take 200 mg by mouth every 6 (six) hours as needed.    Marland Kitchen omeprazole (PRILOSEC) 20 MG capsule 90TAKE 1 CAPSULE BY MOUTH EVERY DAY 90 capsule 0  . ALPRAZolam (XANAX) 0.25 MG tablet Take 1 tablet (0.25 mg total) by mouth 2 (two) times daily as needed for anxiety or  sleep. 45 tablet 3  . fluticasone (FLONASE) 50 MCG/ACT nasal spray Place 2 sprays into both nostrils daily. 16 g 1  . terbinafine (LAMISIL) 250 MG tablet Take 1 tablet (250 mg total) by mouth daily. 30 tablet 1  . zolpidem (AMBIEN) 10 MG tablet Take 1 tablet (10 mg total) by mouth at bedtime as needed for sleep. 30 tablet 3   No facility-administered medications prior to visit.     Allergies  Allergen Reactions  . Erythromycin     REACTION: rash  . Penicillins     REACTION: swelling, rash  . Sulfonamide Derivatives     REACTION: GI upset    Review of Systems  Constitutional: Negative for chills, fever and malaise/fatigue.  HENT: Negative for congestion and hearing loss.   Eyes: Negative for discharge.  Respiratory: Negative for cough, sputum production and shortness of breath.   Cardiovascular: Negative for chest pain, palpitations and leg swelling.  Gastrointestinal: Negative for abdominal pain, blood in stool, constipation, diarrhea, heartburn, nausea and vomiting.  Genitourinary: Negative for dysuria, frequency, hematuria and urgency.  Musculoskeletal: Positive for joint pain and neck pain. Negative for back pain, falls and myalgias.  Skin: Negative for rash.  Neurological: Positive for tingling and focal weakness. Negative for dizziness, sensory change, loss of consciousness, weakness and headaches.  Endo/Heme/Allergies: Negative for environmental allergies. Does not bruise/bleed easily.  Psychiatric/Behavioral: Negative for depression and suicidal ideas. The patient is not nervous/anxious and does not have insomnia.        Objective:    Physical Exam Constitutional:      General: She is not in acute distress.    Appearance: She is not diaphoretic.  HENT:     Head: Normocephalic and atraumatic.     Right Ear: External ear normal.     Left Ear: External ear normal.     Nose: Nose normal.     Mouth/Throat:     Pharynx: No oropharyngeal exudate.  Eyes:     General: No  scleral icterus.       Right eye: No discharge.        Left eye: No discharge.     Conjunctiva/sclera: Conjunctivae normal.     Pupils: Pupils are equal, round, and reactive to light.  Neck:     Musculoskeletal: Normal range of motion and neck supple.     Thyroid: No thyromegaly.  Cardiovascular:     Rate and  Rhythm: Normal rate and regular rhythm.     Heart sounds: Normal heart sounds. No murmur.  Pulmonary:     Effort: Pulmonary effort is normal. No respiratory distress.     Breath sounds: Normal breath sounds. No wheezing or rales.  Abdominal:     General: Bowel sounds are normal. There is no distension.     Palpations: Abdomen is soft. There is no mass.     Tenderness: There is no abdominal tenderness.  Musculoskeletal: Normal range of motion.        General: No tenderness.  Lymphadenopathy:     Cervical: No cervical adenopathy.  Skin:    General: Skin is warm and dry.     Findings: No rash.  Neurological:     Mental Status: She is alert and oriented to person, place, and time.     Cranial Nerves: No cranial nerve deficit.     Coordination: Coordination normal.     Deep Tendon Reflexes: Reflexes are normal and symmetric. Reflexes normal.     BP 100/68 (BP Location: Left Arm, Patient Position: Sitting, Cuff Size: Normal)   Pulse 71   Temp 98.2 F (36.8 C) (Oral)   Resp 18   Ht 5\' 7"  (1.702 m)   Wt 186 lb 3.2 oz (84.5 kg)   SpO2 97%   BMI 29.16 kg/m  Wt Readings from Last 3 Encounters:  02/28/19 186 lb 3.2 oz (84.5 kg)  06/27/18 179 lb 3.2 oz (81.3 kg)  10/01/17 184 lb 9.6 oz (83.7 kg)     Lab Results  Component Value Date   WBC 8.1 02/28/2019   HGB 12.2 02/28/2019   HCT 36.4 02/28/2019   PLT 261.0 02/28/2019   GLUCOSE 91 02/28/2019   CHOL 151 02/28/2019   TRIG 105.0 02/28/2019   HDL 83.50 02/28/2019   LDLCALC 47 02/28/2019   ALT 16 02/28/2019   AST 20 02/28/2019   NA 137 02/28/2019   K 4.3 02/28/2019   CL 104 02/28/2019   CREATININE 0.75 02/28/2019    BUN 15 02/28/2019   CO2 27 02/28/2019   TSH 1.04 02/28/2019   INR 1.0 12/05/2015   HGBA1C 5.4 04/23/2016    Lab Results  Component Value Date   TSH 1.04 02/28/2019   Lab Results  Component Value Date   WBC 8.1 02/28/2019   HGB 12.2 02/28/2019   HCT 36.4 02/28/2019   MCV 96.2 02/28/2019   PLT 261.0 02/28/2019   Lab Results  Component Value Date   NA 137 02/28/2019   K 4.3 02/28/2019   CO2 27 02/28/2019   GLUCOSE 91 02/28/2019   BUN 15 02/28/2019   CREATININE 0.75 02/28/2019   BILITOT 0.5 02/28/2019   ALKPHOS 72 02/28/2019   AST 20 02/28/2019   ALT 16 02/28/2019   PROT 7.0 02/28/2019   ALBUMIN 4.4 02/28/2019   CALCIUM 9.0 02/28/2019   GFR 82.58 02/28/2019   Lab Results  Component Value Date   CHOL 151 02/28/2019   Lab Results  Component Value Date   HDL 83.50 02/28/2019   Lab Results  Component Value Date   LDLCALC 47 02/28/2019   Lab Results  Component Value Date   TRIG 105.0 02/28/2019   Lab Results  Component Value Date   CHOLHDL 2 02/28/2019   Lab Results  Component Value Date   HGBA1C 5.4 04/23/2016       Assessment & Plan:   Problem List Items Addressed This Visit    GERD   Relevant  Orders   CBC (Completed)   Insomnia    Encouraged good sleep hygiene such as dark, quiet room. No blue/green glowing lights such as computer screens in bedroom. No alcohol or stimulants in evening. Cut down on caffeine as able. Regular exercise is helpful but not just prior to bed time. Ambien not significantly helpful so is discontinued and may continue to use Alprazolam prn sparingly      Preventative health care    Patient encouraged to maintain heart healthy diet, regular exercise, adequate sleep. Consider daily probiotics. Take medications as prescribed  Labs reviewed.       Relevant Orders   CBC (Completed)   Comprehensive metabolic panel (Completed)   Lipid panel (Completed)   TSH (Completed)   Vitamin D deficiency - Primary   Relevant Orders     VITAMIN D 25 Hydroxy (Vit-D Deficiency, Fractures) (Completed)   Nonallopathic lesion of cervical region    Is having some numbness into her left arm at times. 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 She will return there and Encouraged moist heat and gentle stretching as tolerated. May try NSAIDs and prescription meds as directed and report if symptoms worsen or seek immediate care      Hyperglycemia    hgba1c acceptable, minimize simple carbs. Increase exercise as tolerated. Continue current meds         I have discontinued Chaka B. Braman's fluticasone, terbinafine, and zolpidem. I am also having her maintain her ibuprofen, cetirizine, omeprazole, escitalopram, and ALPRAZolam.  Meds ordered this encounter  Medications  . ALPRAZolam (XANAX) 0.25 MG tablet    Sig: Take 1 tablet (0.25 mg total) by mouth 2 (two) times daily as needed for anxiety or sleep.    Dispense:  60 tablet    Refill:  3     Penni Homans, MD

## 2019-03-01 NOTE — Assessment & Plan Note (Signed)
Is having some numbness into her left arm at times. 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 She will return there and Encouraged moist heat and gentle stretching as tolerated. May try NSAIDs and prescription meds as directed and report if symptoms worsen or seek immediate care

## 2019-03-01 NOTE — Assessment & Plan Note (Signed)
Encouraged good sleep hygiene such as dark, quiet room. No blue/green glowing lights such as computer screens in bedroom. No alcohol or stimulants in evening. Cut down on caffeine as able. Regular exercise is helpful but not just prior to bed time. Ambien not significantly helpful so is discontinued and may continue to use Alprazolam prn sparingly

## 2019-03-03 ENCOUNTER — Telehealth: Payer: Self-pay | Admitting: *Deleted

## 2019-03-03 NOTE — Telephone Encounter (Signed)
Received Medical records from [facility]; forwarded to provider/SLS 03/06

## 2019-03-13 ENCOUNTER — Encounter: Payer: Self-pay | Admitting: Family Medicine

## 2019-03-22 DIAGNOSIS — Z808 Family history of malignant neoplasm of other organs or systems: Secondary | ICD-10-CM | POA: Diagnosis not present

## 2019-03-22 DIAGNOSIS — Z803 Family history of malignant neoplasm of breast: Secondary | ICD-10-CM | POA: Diagnosis not present

## 2019-03-22 DIAGNOSIS — Z1231 Encounter for screening mammogram for malignant neoplasm of breast: Secondary | ICD-10-CM | POA: Diagnosis not present

## 2019-03-22 DIAGNOSIS — Z6828 Body mass index (BMI) 28.0-28.9, adult: Secondary | ICD-10-CM | POA: Diagnosis not present

## 2019-03-22 DIAGNOSIS — Z01419 Encounter for gynecological examination (general) (routine) without abnormal findings: Secondary | ICD-10-CM | POA: Diagnosis not present

## 2019-03-28 ENCOUNTER — Other Ambulatory Visit: Payer: Self-pay | Admitting: Family Medicine

## 2019-06-26 ENCOUNTER — Other Ambulatory Visit: Payer: Self-pay | Admitting: Family Medicine

## 2019-07-31 DIAGNOSIS — B373 Candidiasis of vulva and vagina: Secondary | ICD-10-CM | POA: Diagnosis not present

## 2019-07-31 DIAGNOSIS — L0100 Impetigo, unspecified: Secondary | ICD-10-CM | POA: Diagnosis not present

## 2019-08-26 DIAGNOSIS — L0100 Impetigo, unspecified: Secondary | ICD-10-CM | POA: Diagnosis not present

## 2019-08-28 ENCOUNTER — Other Ambulatory Visit: Payer: Self-pay | Admitting: Family

## 2019-08-29 ENCOUNTER — Ambulatory Visit: Payer: BLUE CROSS/BLUE SHIELD | Admitting: Family Medicine

## 2019-09-01 ENCOUNTER — Ambulatory Visit (INDEPENDENT_AMBULATORY_CARE_PROVIDER_SITE_OTHER): Payer: BLUE CROSS/BLUE SHIELD | Admitting: Family Medicine

## 2019-09-01 ENCOUNTER — Other Ambulatory Visit: Payer: Self-pay

## 2019-09-01 DIAGNOSIS — L0100 Impetigo, unspecified: Secondary | ICD-10-CM

## 2019-09-01 DIAGNOSIS — R739 Hyperglycemia, unspecified: Secondary | ICD-10-CM

## 2019-09-01 MED ORDER — SULFAMETHOXAZOLE-TRIMETHOPRIM 800-160 MG PO TABS: 1.0000 | ORAL_TABLET | Freq: Two times a day (BID) | ORAL | 0 refills | Status: DC

## 2019-09-03 NOTE — Assessment & Plan Note (Signed)
Had a course of Doxycycline and is now on Clindamycin with minimal improvement in her rash. It is noted mostly around her nose. Is using Mupirocin topically. With history of MRSA we discussed her sensitivity to Sulfa drugs which is largely GI upset so we will try a course and she will use Cetaphil soap and Witch hazel to clean face bid and apply Mupirocin to nostrils qhs. Report if no improvement.

## 2019-09-03 NOTE — Assessment & Plan Note (Signed)
minimize simple carbs. Increase exercise as tolerated.  

## 2019-09-03 NOTE — Progress Notes (Signed)
.Virtual Visit via Video Note  I connected with Samantha Morales on 09/01/19 at  9:40 AM EDT by a video enabled telemedicine application and verified that I am speaking with the correct person using two identifiers.  Location: Patient: work Provider: home   I discussed the limitations of evaluation and management by telemedicine and the availability of in person appointments. The patient expressed understanding and agreed to proceed. Magdalene Molly, CMA was able to get the patient set up on visit, video    Subjective:    Patient ID: Samantha Morales, female    DOB: 04-Aug-1971, 48 y.o.   MRN: [id number]  No chief complaint on file.   HPI Patient is in today for evaluation of her impetigo which is not resolving. She has changed type of mask  And she is on her second round of antibiotics but her rash persists. She had a round of Doxycycline and it did not start to improve til towards the end of treatment. Now she is on Clindamycin and it is not improving. Rash is around nose and started after she started wearing a mask. She is applying Mupirocin daily. Denies CP/palp/SOB/HA/congestion/fevers/GI or GU c/o. Taking meds as prescribed  Past Medical History:  Diagnosis Date  . Acute upper respiratory infection 10/26/2016  . Anemia 03/13/2015  . ANEMIA, HX OF 01/20/2011  . Atypical chest pain 04/15/2012  . Back pain 07/22/2012  . Cellulitis of ankle 05/12/2011  . CHICKENPOX, HX OF 01/20/2011  . DEPRESSION 01/20/2011  . Elevated BP 04/15/2012  . Epistaxis 10/26/2016  . GERD 01/20/2011  . Hyperglycemia 05/10/2016  . Insomnia 12/12/2012  . LACERATION OF FINGER 01/27/2011  . Overweight 01/19/2016  . Preventative health care 02/28/2014  . RESTLESS LEG SYNDROME, HX OF 01/20/2011  . Sacroiliac joint pain 07/22/2012  . Sinusitis, acute 12/12/2012  . Tobacco abuse 05/12/2011  . 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 lasik eye surgery X b/l       Family History  Problem Relation Age of Onset  . Hypertension Mother   . Arrhythmia Mother   . Lupus Sister   . Hypertension Brother   . Epilepsy Brother   . Cancer Maternal Grandmother        breast  . Alzheimer's disease Maternal Grandmother   . Aneurysm Maternal Grandfather        brain  . Arthritis Maternal Grandfather        rheumatoid  . Nephrolithiasis Paternal Grandmother   . Heart attack Paternal Grandfather   . Anemia Brother     Social History   Socioeconomic History  . Marital status: Married    Spouse name: Not on file  . Number of children: Not on file  . Years of education: Not on file  . Highest education level: Not on file  Occupational History  . Not on file  Social Needs  . Financial resource strain: Not on file  . Food insecurity    Worry: Not on file    Inability: Not on file  . Transportation needs    Medical: Not on file    Non-medical: Not on file  Tobacco Use  . Smoking status: Former Smoker    Packs/day: 0.50    Quit date: 04/12/2012    Years since quitting: 7.3  . Smokeless tobacco: Never Used  Substance and Sexual Activity  . Alcohol use: Yes    Alcohol/week: 0.0 standard drinks    Comment:  6-7 a week  . Drug use: No  . Sexual activity: Yes    Partners: Male    Comment: no dietary, lives with husband, wears seatbelt  Lifestyle  . Physical activity    Days per week: Not on file    Minutes per session: Not on file  . Stress: Not on file  Relationships  . Social Herbalist on phone: Not on file    Gets together: Not on file    Attends religious service: Not on file    Active member of club or organization: Not on file    Attends meetings of clubs or organizations: Not on file    Relationship status: Not on file  . Intimate partner violence    Fear of current or ex partner: Not on file    Emotionally abused: Not on file    Physically abused: Not on file    Forced sexual activity: Not on file  Other Topics Concern   . Not on file  Social History Narrative  . Not on file    Outpatient Medications Prior to Visit  Medication Sig Dispense Refill  . ALPRAZolam (XANAX) 0.25 MG tablet Take 1 tablet (0.25 mg total) by mouth 2 (two) times daily as needed for anxiety or sleep. 60 tablet 3  . cetirizine (ZYRTEC) 10 MG tablet Take 10 mg by mouth daily.    Marland Kitchen escitalopram (LEXAPRO) 20 MG tablet TAKE 1 TABLET BY MOUTH EVERY DAY 90 tablet 1  . ibuprofen (ADVIL,MOTRIN) 200 MG tablet Take 200 mg by mouth every 6 (six) hours as needed.    Marland Kitchen omeprazole (PRILOSEC) 20 MG capsule 90TAKE 1 CAPSULE BY MOUTH EVERY DAY 90 capsule 0   No facility-administered medications prior to visit.     Allergies  Allergen Reactions  . Erythromycin     REACTION: rash  . Penicillins     REACTION: swelling, rash  . Sulfonamide Derivatives     REACTION: GI upset    Review of Systems  Constitutional: Negative for fever and malaise/fatigue.  HENT: Negative for congestion.   Eyes: Negative for blurred vision.  Respiratory: Negative for shortness of breath.   Cardiovascular: Negative for chest pain, palpitations and leg swelling.  Gastrointestinal: Negative for abdominal pain, blood in stool and nausea.  Genitourinary: Negative for dysuria and frequency.  Musculoskeletal: Negative for falls.  Skin: Positive for itching and rash.  Neurological: Negative for dizziness, loss of consciousness and headaches.  Endo/Heme/Allergies: Negative for environmental allergies.  Psychiatric/Behavioral: Negative for depression. The patient is not nervous/anxious.        Objective:    Physical Exam Constitutional:      Appearance: Normal appearance. She is not ill-appearing.  HENT:     Head: Normocephalic and atraumatic.     Nose: Nose normal.  Eyes:     General:        Right eye: No discharge.        Left eye: No discharge.  Pulmonary:     Effort: Pulmonary effort is normal.  Skin:    Findings: Rash present.     Comments: Papular  lesions with red base to left of nose.  Neurological:     Mental Status: She is alert.     There were no vitals taken for this visit. Wt Readings from Last 3 Encounters:  02/28/19 186 lb 3.2 oz (84.5 kg)  06/27/18 179 lb 3.2 oz (81.3 kg)  10/01/17 184 lb 9.6 oz (83.7 kg)  Diabetic Foot Exam - Simple   No data filed     Lab Results  Component Value Date   WBC 8.1 02/28/2019   HGB 12.2 02/28/2019   HCT 36.4 02/28/2019   PLT 261.0 02/28/2019   GLUCOSE 91 02/28/2019   CHOL 151 02/28/2019   TRIG 105.0 02/28/2019   HDL 83.50 02/28/2019   LDLCALC 47 02/28/2019   ALT 16 02/28/2019   AST 20 02/28/2019   NA 137 02/28/2019   K 4.3 02/28/2019   CL 104 02/28/2019   CREATININE 0.75 02/28/2019   BUN 15 02/28/2019   CO2 27 02/28/2019   TSH 1.04 02/28/2019   INR 1.0 12/05/2015   HGBA1C 5.4 04/23/2016    Lab Results  Component Value Date   TSH 1.04 02/28/2019   Lab Results  Component Value Date   WBC 8.1 02/28/2019   HGB 12.2 02/28/2019   HCT 36.4 02/28/2019   MCV 96.2 02/28/2019   PLT 261.0 02/28/2019   Lab Results  Component Value Date   NA 137 02/28/2019   K 4.3 02/28/2019   CO2 27 02/28/2019   GLUCOSE 91 02/28/2019   BUN 15 02/28/2019   CREATININE 0.75 02/28/2019   BILITOT 0.5 02/28/2019   ALKPHOS 72 02/28/2019   AST 20 02/28/2019   ALT 16 02/28/2019   PROT 7.0 02/28/2019   ALBUMIN 4.4 02/28/2019   CALCIUM 9.0 02/28/2019   GFR 82.58 02/28/2019   Lab Results  Component Value Date   CHOL 151 02/28/2019   Lab Results  Component Value Date   HDL 83.50 02/28/2019   Lab Results  Component Value Date   LDLCALC 47 02/28/2019   Lab Results  Component Value Date   TRIG 105.0 02/28/2019   Lab Results  Component Value Date   CHOLHDL 2 02/28/2019   Lab Results  Component Value Date   HGBA1C 5.4 04/23/2016       Assessment & Plan:   Problem List Items Addressed This Visit    Impetigo    Had a course of Doxycycline and is now on Clindamycin  with minimal improvement in her rash. It is noted mostly around her nose. Is using Mupirocin topically. With history of MRSA we discussed her sensitivity to Sulfa drugs which is largely GI upset so we will try a course and she will use Cetaphil soap and Witch hazel to clean face bid and apply Mupirocin to nostrils qhs. Report if no improvement.      Relevant Medications   sulfamethoxazole-trimethoprim (BACTRIM DS) 800-160 MG tablet   Hyperglycemia     minimize simple carbs. Increase exercise as tolerated.          I am having Nyriah B. Fairfax start on sulfamethoxazole-trimethoprim. I am also having her maintain her ibuprofen, cetirizine, ALPRAZolam, omeprazole, and escitalopram.  Meds ordered this encounter  Medications  . sulfamethoxazole-trimethoprim (BACTRIM DS) 800-160 MG tablet    Sig: Take 1 tablet by mouth 2 (two) times daily.    Dispense:  20 tablet    Refill:  0     I discussed the assessment and treatment plan with the patient. The patient was provided an opportunity to ask questions and all were answered. The patient agreed with the plan and demonstrated an understanding of the instructions.   The patient was advised to call back or seek an in-person evaluation if the symptoms worsen or if the condition fails to improve as anticipated.  I provided 15 minutes of non-face-to-face time during this encounter.  Penni Homans, MD

## 2019-09-12 DIAGNOSIS — R509 Fever, unspecified: Secondary | ICD-10-CM | POA: Diagnosis not present

## 2019-09-13 DIAGNOSIS — Z20828 Contact with and (suspected) exposure to other viral communicable diseases: Secondary | ICD-10-CM | POA: Diagnosis not present

## 2019-09-23 ENCOUNTER — Other Ambulatory Visit: Payer: Self-pay | Admitting: Family Medicine

## 2019-12-20 ENCOUNTER — Other Ambulatory Visit: Payer: Self-pay | Admitting: Family Medicine

## 2020-02-21 ENCOUNTER — Other Ambulatory Visit: Payer: Self-pay | Admitting: Family Medicine

## 2020-02-26 ENCOUNTER — Other Ambulatory Visit: Payer: Self-pay

## 2020-02-27 ENCOUNTER — Encounter: Payer: Self-pay | Admitting: Family Medicine

## 2020-02-27 ENCOUNTER — Ambulatory Visit: Payer: BLUE CROSS/BLUE SHIELD | Admitting: Family Medicine

## 2020-02-27 ENCOUNTER — Other Ambulatory Visit: Payer: Self-pay

## 2020-02-27 VITALS — BP 122/72 | HR 59 | Temp 96.9°F | Resp 16 | Ht 67.0 in | Wt 184.0 lb

## 2020-02-27 DIAGNOSIS — D649 Anemia, unspecified: Secondary | ICD-10-CM | POA: Diagnosis not present

## 2020-02-27 DIAGNOSIS — K219 Gastro-esophageal reflux disease without esophagitis: Secondary | ICD-10-CM

## 2020-02-27 DIAGNOSIS — F418 Other specified anxiety disorders: Secondary | ICD-10-CM

## 2020-02-27 DIAGNOSIS — E559 Vitamin D deficiency, unspecified: Secondary | ICD-10-CM | POA: Diagnosis not present

## 2020-02-27 DIAGNOSIS — R739 Hyperglycemia, unspecified: Secondary | ICD-10-CM

## 2020-02-27 LAB — TSH: TSH: 1 u[IU]/mL (ref 0.35–4.50)

## 2020-02-27 LAB — COMPREHENSIVE METABOLIC PANEL
ALT: 18 U/L (ref 0–35)
AST: 21 U/L (ref 0–37)
Albumin: 4.3 g/dL (ref 3.5–5.2)
Alkaline Phosphatase: 70 U/L (ref 39–117)
BUN: 13 mg/dL (ref 6–23)
CO2: 29 mEq/L (ref 19–32)
Calcium: 9.7 mg/dL (ref 8.4–10.5)
Chloride: 102 mEq/L (ref 96–112)
Creatinine, Ser: 0.73 mg/dL (ref 0.40–1.20)
GFR: 84.84 mL/min (ref >60.00)
Glucose, Bld: 74 mg/dL (ref 70–99)
Potassium: 5.1 mEq/L (ref 3.5–5.1)
Sodium: 136 mEq/L (ref 135–145)
Total Bilirubin: 0.7 mg/dL (ref 0.2–1.2)
Total Protein: 7.5 g/dL (ref 6.0–8.3)

## 2020-02-27 LAB — CBC
HCT: 39.7 % (ref 36.0–46.0)
Hemoglobin: 13.4 g/dL (ref 12.0–15.0)
MCHC: 33.7 g/dL (ref 30.0–36.0)
MCV: 100 fl (ref 78.0–100.0)
Platelets: 245 10*3/uL (ref 150.0–400.0)
RBC: 3.97 Mil/uL (ref 3.87–5.11)
RDW: 13.5 % (ref 11.5–15.5)
WBC: 5.6 10*3/uL (ref 4.0–10.5)

## 2020-02-27 LAB — LIPID PANEL
Cholesterol: 175 mg/dL (ref 0–200)
HDL: 88.7 mg/dL (ref >39.00)
LDL Cholesterol: 75 mg/dL (ref 0–99)
NonHDL: 86.32
Total CHOL/HDL Ratio: 2
Triglycerides: 56 mg/dL (ref 0.0–149.0)
VLDL: 11.2 mg/dL (ref 0.0–40.0)

## 2020-02-27 LAB — VITAMIN D 25 HYDROXY (VIT D DEFICIENCY, FRACTURES): VITD: 30.86 ng/mL (ref 30.00–100.00)

## 2020-02-27 LAB — HEMOGLOBIN A1C: Hgb A1c MFr Bld: 5.4 % (ref 4.6–6.5)

## 2020-02-27 MED ORDER — ESCITALOPRAM OXALATE 20 MG PO TABS: 30.0000 mg | ORAL_TABLET | Freq: Every day | ORAL | 1 refills | Status: DC

## 2020-02-27 NOTE — Patient Instructions (Signed)
Omron Blood Pressure cuff, upper arm, want BP 100-140/60-90 Pulse oximeter, want oxygen in 90s  Weekly vitals  Take Multivitamin with minerals, selenium Vitamin D 1000-2000 IU daily Probiotic with lactobacillus and bifidophilus Asprin EC 81 mg daily  Melatonin 2-5 mg at bedtime  [email or web address]  The mRNA technology has been in development for 20 years and we already had the Coronavirus family of viruses (which usually just cause the common cold) genetically mapped already which is why we were able to come up with viable vaccine candidates so quickly in stage 1, then stage 2 scientifically took the correct amount of time what we did to speed it up was just build the manufacturing platform at the same time we were running the experiments so if it worked we could produce faster. And stage 3 has now had many months and millions of people immunized and we are seeing the immunity hold for over 9 months now with sign of it dissipating and no significant numbers of adverse reactions.  During every flu season we see 2 anaphylactic reactions for every million shots given and we initially thought we would see 11 per million with the COVID vaccine but now we see only 2-3 with Moderna and 5 or so with [place] so compared to someone is dying every 20 minutes from [place] and more deadly and infectious strains are coming it is definitely best when weighing the risks and benefits to take the shots.  Another pooled analysis of the 5 most utilized vaccines in the world shows that after full immunization so far no one has died from [place].

## 2020-02-28 NOTE — Assessment & Plan Note (Deleted)
Avoid offending foods, start probiotics. Do not eat large meals in late evening and consider raising head of bed.  

## 2020-02-28 NOTE — Assessment & Plan Note (Signed)
Will try increasing Lexapro to 30 mg daily and reassess. She will report If she has any concerns.

## 2020-02-28 NOTE — Progress Notes (Signed)
Subjective:    Patient ID: Samantha Morales, female    DOB: 09-Aug-1971, 49 y.o.   MRN: [id number]  Chief Complaint  Patient presents with  . Hyperglycemia    6 month follow up  . Anxiety    HPI Patient is in today for follow up on chronic medical concerns. She has been very stressed at work and is trying to get a new job with [employer]. She notes anhedonia but denies any suicidal ideation. She notes high levels of anxiety. She denies any recent febrile illness or hospitalizations. Denies CP/palp/SOB/HA/congestion/fevers/GI or GU c/o. Taking meds as prescribed  Past Medical History:  Diagnosis Date  . Acute upper respiratory infection 10/26/2016  . Anemia 03/13/2015  . ANEMIA, HX OF 01/20/2011  . Atypical chest pain 04/15/2012  . Back pain 07/22/2012  . Cellulitis of ankle 05/12/2011  . CHICKENPOX, HX OF 01/20/2011  . DEPRESSION 01/20/2011  . Elevated BP 04/15/2012  . Epistaxis 10/26/2016  . GERD 01/20/2011  . Hyperglycemia 05/10/2016  . Insomnia 12/12/2012  . LACERATION OF FINGER 01/27/2011  . Overweight 01/19/2016  . Preventative health care 02/28/2014  . RESTLESS LEG SYNDROME, HX OF 01/20/2011  . Sacroiliac joint pain 07/22/2012  . Sinusitis, acute 12/12/2012  . Tobacco abuse 05/12/2011  . 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 lasik eye surgery X b/l      Family History  Problem Relation Age of Onset  . Hypertension Mother   . Arrhythmia Mother   . Lupus Sister   . Hypertension Brother   . Epilepsy Brother   . Cancer Maternal Grandmother        breast  . Alzheimer's disease Maternal Grandmother   . Aneurysm Maternal Grandfather        brain  . Arthritis Maternal Grandfather        rheumatoid  . Nephrolithiasis Paternal Grandmother   . Heart attack Paternal Grandfather   . Anemia Brother     Social History   Socioeconomic History  . Marital status: Married    Spouse name: Not on file  . Number of  children: Not on file  . Years of education: Not on file  . Highest education level: Not on file  Occupational History  . Not on file  Tobacco Use  . Smoking status: Former Smoker    Packs/day: 0.50    Quit date: 04/12/2012    Years since quitting: 7.8  . Smokeless tobacco: Never Used  Substance and Sexual Activity  . Alcohol use: Yes    Alcohol/week: 0.0 standard drinks    Comment: 6-7 a week  . Drug use: No  . Sexual activity: Yes    Partners: Male    Comment: no dietary, lives with husband, wears seatbelt  Other Topics Concern  . Not on file  Social History Narrative  . Not on file   Social Determinants of Health   Financial Resource Strain:   . Difficulty of Paying Living Expenses: Not on file  Food Insecurity:   . Worried About Charity fundraiser in the Last Year: Not on file  . Ran Out of Food in the Last Year: Not on file  Transportation Needs:   . Lack of Transportation (Medical): Not on file  . Lack of Transportation (Non-Medical): Not on file  Physical Activity:   . Days of Exercise per Week: Not on file  . Minutes of Exercise per Session: Not on file  Stress:   . Feeling of Stress : Not on file  Social Connections:   . Frequency of Communication with Friends and Family: Not on file  . Frequency of Social Gatherings with Friends and Family: Not on file  . Attends Religious Services: Not on file  . Active Member of Clubs or Organizations: Not on file  . Attends Archivist Meetings: Not on file  . Marital Status: Not on file  Intimate Partner Violence:   . Fear of Current or Ex-Partner: Not on file  . Emotionally Abused: Not on file  . Physically Abused: Not on file  . Sexually Abused: Not on file    Outpatient Medications Prior to Visit  Medication Sig Dispense Refill  . ALPRAZolam (XANAX) 0.25 MG tablet Take 1 tablet (0.25 mg total) by mouth 2 (two) times daily as needed for anxiety or sleep. 60 tablet 3  . ibuprofen (ADVIL,MOTRIN) 200 MG  tablet Take 200 mg by mouth every 6 (six) hours as needed.    Marland Kitchen omeprazole (PRILOSEC) 20 MG capsule 90TAKE 1 CAPSULE BY MOUTH EVERY DAY 90 capsule 0  . zolpidem (AMBIEN CR) 12.5 MG CR tablet Take by mouth.    . escitalopram (LEXAPRO) 20 MG tablet TAKE 1 TABLET BY MOUTH EVERY DAY 90 tablet 1  . cetirizine (ZYRTEC) 10 MG tablet Take 10 mg by mouth daily.    Marland Kitchen sulfamethoxazole-trimethoprim (BACTRIM DS) 800-160 MG tablet Take 1 tablet by mouth 2 (two) times daily. 20 tablet 0   No facility-administered medications prior to visit.    Allergies  Allergen Reactions  . Erythromycin     REACTION: rash  . Penicillins     REACTION: swelling, rash  . Sulfonamide Derivatives     REACTION: GI upset    Review of Systems  Constitutional: Negative for fever and malaise/fatigue.  HENT: Negative for congestion.   Eyes: Negative for blurred vision.  Respiratory: Negative for shortness of breath.   Cardiovascular: Negative for chest pain, palpitations and leg swelling.  Gastrointestinal: Negative for abdominal pain, blood in stool and nausea.  Genitourinary: Negative for dysuria and frequency.  Musculoskeletal: Positive for back pain. Negative for falls.  Skin: Negative for rash.  Neurological: Negative for dizziness, loss of consciousness and headaches.  Endo/Heme/Allergies: Negative for environmental allergies.  Psychiatric/Behavioral: Positive for depression. The patient is nervous/anxious.        Objective:    Physical Exam Vitals and nursing note reviewed.  Constitutional:      General: She is not in acute distress.    Appearance: She is well-developed.  HENT:     Head: Normocephalic and atraumatic.     Nose: Nose normal.  Eyes:     General:        Right eye: No discharge.        Left eye: No discharge.  Cardiovascular:     Rate and Rhythm: Normal rate and regular rhythm.     Heart sounds: No murmur.  Pulmonary:     Effort: Pulmonary effort is normal.     Breath sounds: Normal  breath sounds.  Abdominal:     General: Bowel sounds are normal.     Palpations: Abdomen is soft.     Tenderness: There is no abdominal tenderness.  Musculoskeletal:     Cervical back: Normal range of motion and neck supple.  Skin:    General: Skin is warm and dry.  Neurological:     Mental Status: She is alert and oriented to person,  place, and time.     BP 122/72 (BP Location: Left Arm, Patient Position: Sitting, Cuff Size: Normal)   Pulse (!) 59   Temp (!) 96.9 F (36.1 C) (Temporal)   Resp 16   Ht 5\' 7"  (1.702 m)   Wt 184 lb (83.5 kg)   SpO2 99%   BMI 28.82 kg/m  Wt Readings from Last 3 Encounters:  02/27/20 184 lb (83.5 kg)  02/28/19 186 lb 3.2 oz (84.5 kg)  06/27/18 179 lb 3.2 oz (81.3 kg)    Diabetic Foot Exam - Simple   No data filed     Lab Results  Component Value Date   WBC 5.6 02/27/2020   HGB 13.4 02/27/2020   HCT 39.7 02/27/2020   PLT 245.0 02/27/2020   GLUCOSE 74 02/27/2020   CHOL 175 02/27/2020   TRIG 56.0 02/27/2020   HDL 88.70 02/27/2020   LDLCALC 75 02/27/2020   ALT 18 02/27/2020   AST 21 02/27/2020   NA 136 02/27/2020   K 5.1 02/27/2020   CL 102 02/27/2020   CREATININE 0.73 02/27/2020   BUN 13 02/27/2020   CO2 29 02/27/2020   TSH 1.00 02/27/2020   INR 1.0 12/05/2015   HGBA1C 5.4 02/27/2020    Lab Results  Component Value Date   TSH 1.00 02/27/2020   Lab Results  Component Value Date   WBC 5.6 02/27/2020   HGB 13.4 02/27/2020   HCT 39.7 02/27/2020   MCV 100.0 02/27/2020   PLT 245.0 02/27/2020   Lab Results  Component Value Date   NA 136 02/27/2020   K 5.1 02/27/2020   CO2 29 02/27/2020   GLUCOSE 74 02/27/2020   BUN 13 02/27/2020   CREATININE 0.73 02/27/2020   BILITOT 0.7 02/27/2020   ALKPHOS 70 02/27/2020   AST 21 02/27/2020   ALT 18 02/27/2020   PROT 7.5 02/27/2020   ALBUMIN 4.3 02/27/2020   CALCIUM 9.7 02/27/2020   GFR 84.84 02/27/2020   Lab Results  Component Value Date   CHOL 175 02/27/2020   Lab Results   Component Value Date   HDL 88.70 02/27/2020   Lab Results  Component Value Date   LDLCALC 75 02/27/2020   Lab Results  Component Value Date   TRIG 56.0 02/27/2020   Lab Results  Component Value Date   CHOLHDL 2 02/27/2020   Lab Results  Component Value Date   HGBA1C 5.4 02/27/2020       Assessment & Plan:   Problem List Items Addressed This Visit    Depression with anxiety    Will try increasing Lexapro to 30 mg daily and reassess. She will report If she has any concerns.       Relevant Medications   escitalopram (LEXAPRO) 20 MG tablet   GERD   Relevant Orders   CBC (Completed)   TSH (Completed)   Vitamin D deficiency - Primary    Supplement and monitor      Relevant Orders   Comprehensive metabolic panel (Completed)   VITAMIN D 25 Hydroxy (Vit-D Deficiency, Fractures) (Completed)   Anemia   Hyperglycemia    hgba1c acceptable, minimize simple carbs. Increase exercise as tolerated.      Relevant Orders   Lipid panel (Completed)   TSH (Completed)   Hemoglobin A1c (Completed)      I have discontinued Nakira B. Coil's cetirizine and sulfamethoxazole-trimethoprim. I have also changed her escitalopram. Additionally, I am having her maintain her ibuprofen, ALPRAZolam, omeprazole, and zolpidem.  Meds ordered  this encounter  Medications  . escitalopram (LEXAPRO) 20 MG tablet    Sig: Take 1.5 tablets (30 mg total) by mouth daily.    Dispense:  135 tablet    Refill:  1     Penni Homans, MD

## 2020-02-28 NOTE — Assessment & Plan Note (Signed)
Supplement and monitor 

## 2020-02-28 NOTE — Assessment & Plan Note (Signed)
hgba1c acceptable, minimize simple carbs. Increase exercise as tolerated.  

## 2020-03-19 ENCOUNTER — Other Ambulatory Visit: Payer: Self-pay | Admitting: Family Medicine

## 2020-04-18 DIAGNOSIS — Z6828 Body mass index (BMI) 28.0-28.9, adult: Secondary | ICD-10-CM | POA: Diagnosis not present

## 2020-04-18 DIAGNOSIS — Z1231 Encounter for screening mammogram for malignant neoplasm of breast: Secondary | ICD-10-CM | POA: Diagnosis not present

## 2020-04-18 DIAGNOSIS — Z01419 Encounter for gynecological examination (general) (routine) without abnormal findings: Secondary | ICD-10-CM | POA: Diagnosis not present

## 2020-04-28 DIAGNOSIS — S61002A Unspecified open wound of left thumb without damage to nail, initial encounter: Secondary | ICD-10-CM | POA: Diagnosis not present

## 2020-05-04 DIAGNOSIS — S61002D Unspecified open wound of left thumb without damage to nail, subsequent encounter: Secondary | ICD-10-CM | POA: Diagnosis not present

## 2020-05-04 DIAGNOSIS — Z4802 Encounter for removal of sutures: Secondary | ICD-10-CM | POA: Diagnosis not present

## 2020-05-31 ENCOUNTER — Ambulatory Visit: Payer: BLUE CROSS/BLUE SHIELD | Admitting: Family Medicine

## 2020-06-26 ENCOUNTER — Ambulatory Visit (HOSPITAL_BASED_OUTPATIENT_CLINIC_OR_DEPARTMENT_OTHER)
Admission: RE | Admit: 2020-06-26 | Discharge: 2020-06-26 | Disposition: A | Discharge status: Home / Self Care | Payer: BLUE CROSS/BLUE SHIELD | Source: Ambulatory Visit | Attending: Medical | Admitting: Medical

## 2020-06-26 ENCOUNTER — Ambulatory Visit: Payer: BLUE CROSS/BLUE SHIELD | Admitting: Medical

## 2020-06-26 ENCOUNTER — Other Ambulatory Visit: Payer: Self-pay

## 2020-06-26 VITALS — BP 133/73 | HR 82 | Resp 18 | Ht 67.0 in | Wt 184.0 lb

## 2020-06-26 DIAGNOSIS — H814 Vertigo of central origin: Secondary | ICD-10-CM

## 2020-06-26 DIAGNOSIS — R55 Syncope and collapse: Secondary | ICD-10-CM | POA: Diagnosis not present

## 2020-06-26 DIAGNOSIS — R29898 Other symptoms and signs involving the musculoskeletal system: Secondary | ICD-10-CM

## 2020-06-26 DIAGNOSIS — R42 Dizziness and giddiness: Secondary | ICD-10-CM | POA: Diagnosis not present

## 2020-06-26 DIAGNOSIS — M6281 Muscle weakness (generalized): Secondary | ICD-10-CM | POA: Diagnosis not present

## 2020-06-26 DIAGNOSIS — R9431 Abnormal electrocardiogram [ECG] [EKG]: Secondary | ICD-10-CM

## 2020-06-26 NOTE — Progress Notes (Signed)
Subjective:    Patient ID: Samantha Morales, female    DOB: March 17, 1971, 49 y.o.   MRN: [id number]  HPI  Pt states she has some dizziness recently.  Pt does not where her body feels like she looses control of her upper and lower ext. States body feels like noodles. She never passes out. She states states before those events she feels extreme weakness to her legs. Pt never had syncope. She has always been conscious during these episodes.  Pt states 25 years ago had heart monitor and mri. Both were negative. Pt never had eeg.   Pt states 2 weeks ago she grabbed door handle and she says her head was shaking like she was having a seizure. No loc.   Last week she was walking. Felt week in both upper and lower ext. Her head was shaking and she slid down wall. But she did not loose consciousness.  No episodes of incontinence. Pt had mild occipital area pain or ha after the recent events.  No episodes of eye pain or blurred vision.  Also has rt knee pain.      Review of Systems  Constitutional: Negative for chills, fatigue and fever.  Respiratory: Negative for cough, chest tightness, shortness of breath and wheezing.   Cardiovascular: Negative for chest pain and palpitations.       No chest pain. No palpitations.  Gastrointestinal: Negative for abdominal pain.  Genitourinary: Negative for dysuria.  Musculoskeletal: Negative for back pain, myalgias and neck stiffness.  Skin: Negative for rash.  Neurological: Negative for dizziness, light-headedness and headaches.       No acute symptoms presently.  Hematological: Negative for adenopathy. Does not bruise/bleed easily.  Psychiatric/Behavioral: Negative for behavioral problems and confusion.    Past Medical History:  Diagnosis Date  . Acute upper respiratory infection 10/26/2016  . Anemia 03/13/2015  . ANEMIA, HX OF 01/20/2011  . Atypical chest pain 04/15/2012  . Back pain 07/22/2012  . Cellulitis of ankle 05/12/2011  . CHICKENPOX,  HX OF 01/20/2011  . DEPRESSION 01/20/2011  . Elevated BP 04/15/2012  . Epistaxis 10/26/2016  . GERD 01/20/2011  . Hyperglycemia 05/10/2016  . Insomnia 12/12/2012  . LACERATION OF FINGER 01/27/2011  . Overweight 01/19/2016  . Preventative health care 02/28/2014  . RESTLESS LEG SYNDROME, HX OF 01/20/2011  . Sacroiliac joint pain 07/22/2012  . Sinusitis, acute 12/12/2012  . Tobacco abuse 05/12/2011  . 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 Marital status: Married    Spouse name: Not on file  . Number of children: Not on file  . Years of education: Not on file  . Highest education level: Not on file  Occupational History  . Not on file  Tobacco Use  . Smoking status: Former Smoker    Packs/day: 0.50    Quit date: 04/12/2012    Years since quitting: 8.2  . Smokeless tobacco: Never Used  Substance and Sexual Activity  . Alcohol use: Yes    Alcohol/week: 0.0 standard drinks    Comment: 6-7 a week  . Drug use: No  . Sexual activity: Yes    Partners: Male    Comment: no dietary, lives with husband, wears seatbelt  Other Topics Concern  . Not on file  Social History Narrative  . Not on file   Social Determinants of Health   Financial Resource Strain:   . Difficulty of Paying Living Expenses:   Food Insecurity:   .  Worried About Charity fundraiser in the Last Year:   . Arboriculturist in the Last Year:   Transportation Needs:   . Film/video editor (Medical):   Marland Kitchen Lack of Transportation (Non-Medical):   Physical Activity:   . Days of Exercise per Week:   . Minutes of Exercise per Session:   Stress:   . Feeling of Stress :   Social Connections:   . Frequency of Communication with Friends and Family:   . Frequency of Social Gatherings with Friends and Family:   . Attends Religious Services:   . Active Member of Clubs or Organizations:   . Attends Archivist Meetings:   Marland Kitchen Marital Status:   Intimate Partner Violence:     . Fear of Current or Ex-Partner:   . Emotionally Abused:   Marland Kitchen Physically Abused:   . Sexually Abused:     Past Surgical History:  Procedure Laterality Date  . lasik eye surgery X b/l      Family History  Problem Relation Age of Onset  . Hypertension Mother   . Arrhythmia Mother   . Lupus Sister   . Hypertension Brother   . Epilepsy Brother   . Cancer Maternal Grandmother        breast  . Alzheimer's disease Maternal Grandmother   . Aneurysm Maternal Grandfather        brain  . Arthritis Maternal Grandfather        rheumatoid  . Nephrolithiasis Paternal Grandmother   . Heart attack Paternal Grandfather   . Anemia Brother     Allergies  Allergen Reactions  . Erythromycin     REACTION: rash  . Penicillins     REACTION: swelling, rash  . Sulfonamide Derivatives     REACTION: GI upset    Current Outpatient Medications on File Prior to Visit  Medication Sig Dispense Refill  . ALPRAZolam (XANAX) 0.25 MG tablet Take 1 tablet (0.25 mg total) by mouth 2 (two) times daily as needed for anxiety or sleep. 60 tablet 3  . escitalopram (LEXAPRO) 20 MG tablet Take 1.5 tablets (30 mg total) by mouth daily. 135 tablet 1  . ibuprofen (ADVIL,MOTRIN) 200 MG tablet Take 200 mg by mouth every 6 (six) hours as needed.    Marland Kitchen omeprazole (PRILOSEC) 20 MG capsule 90TAKE 1 CAPSULE BY MOUTH EVERY DAY 90 capsule 1  . zolpidem (AMBIEN CR) 12.5 MG CR tablet Take by mouth.     No current facility-administered medications on file prior to visit.    BP 133/73 (BP Location: Left Arm, Patient Position: Sitting, Cuff Size: Large)   Pulse 82   Resp 18   Ht 5\' 7"  (1.702 m)   Wt 184 lb (83.5 kg)   SpO2 100%   BMI 28.82 kg/m       Objective:   Physical Exam  General Mental Status- Alert. General Appearance- Not in acute distress.   Skin General: Color- Normal Color. Moisture- Normal Moisture.  Neck Carotid Arteries- Normal color. Moisture- Normal Moisture. No carotid bruits. No  JVD.  Chest and Lung Exam Auscultation: Breath Sounds:-Normal.  Cardiovascular Auscultation:Rythm- Regular. Murmurs & Other Heart Sounds:Auscultation of the heart reveals- No Murmurs.  Abdomen Inspection:-Inspeection Normal. Palpation/Percussion:Note:No mass. Palpation and Percussion of the abdomen reveal- Non Tender, Non Distended + BS, no rebound or guarding.    Neurologic Cranial Nerve exam:- CN III-XII intact(No nystagmus), symmetric smile. Drift Test:- No drift. Romberg Exam:- Negative but little off balance intially. Heal to  Toe Gait exam:-mild difficulty. Finger to Nose:- Normal/Intact Strength:- 5/5 equal and symmetric strength both upper and lower extremities.      Assessment & Plan:  You have history of near syncopal episodes as you had some most recently.  Episodes are accompanied with dizziness and extremity weakness.  Overall you have good neurologic exam but on Romberg testing you were a little bit shaky initially and slight difficulty on heel-to-toe gait.  Nothing real dramatic.  Negative work-up in the past for these episodes.  However that work-up was 25 years ago.  Based on recent episodes over the past 2 weeks do want to try to get stat CT of head without contrast.  Also go ahead and refer to neurologist as you may need repeat MRI and probable EEG to rule out possible seizure.  On EKG today had sinus rhythm but short PR interval.  Overall presentation seems more likely to be possible neurologic cause but based on EKG we will go ahead and refer to cardiologist as well.  Would recommend that she stay well-hydrated.  Try not to overwork.  Eat healthy diet.  Get adequate sleep.  If any recurrent episodes let us know.  If any actual syncope or you do not gain normal strength to extremities then recommend ED evaluation.  Get CBC and CMP today.  Asked that you get scheduled for follow-up with your PCP within 10 to 14 days.  Sooner if needed.  Mackie Pai, PA-C    Time spent with patient today was  45 minutes which consisted of chart review, discussing differrential diagnosis, work up, treatment and documentation.

## 2020-06-26 NOTE — Patient Instructions (Addendum)
You have history of near syncopal episodes as you had some most recently.  Episodes are accompanied with dizziness and extremity weakness.  Overall you have good neurologic exam but on Romberg testing you were a little bit shaky initially and slight difficulty on heel-to-toe gait.  Nothing real dramatic.  Negative work-up in the past for these episodes.  However that work-up was 25 years ago.  Based on recent episodes over the past 2 weeks do want to try to get stat CT of head without contrast.  Also go ahead and refer to neurologist as you may need repeat MRI and probable EEG to rule out possible seizure.  On EKG today had sinus rhythm but short PR interval.  Overall presentation seems more likely to be possible neurologic cause but based on EKG we will go ahead and refer to cardiologist as well.  Would recommend stay well-hydrated.  Try not to overwork.  Eat healthy diet.  Get adequate sleep.  If any recurrent episodes let us know.  If any actual syncope or you do not gain normal strength to extremities then recommend ED evaluation.  Get CBC and CMP today.  Asked that you get scheduled for follow-up with your PCP within 10 to 14 days.  Sooner if needed.

## 2020-06-27 LAB — COMPREHENSIVE METABOLIC PANEL
ALT: 21 U/L (ref 0–35)
AST: 22 U/L (ref 0–37)
Albumin: 4.6 g/dL (ref 3.5–5.2)
Alkaline Phosphatase: 79 U/L (ref 39–117)
BUN: 16 mg/dL (ref 6–23)
CO2: 27 mEq/L (ref 19–32)
Calcium: 9.8 mg/dL (ref 8.4–10.5)
Chloride: 103 mEq/L (ref 96–112)
Creatinine, Ser: 0.89 mg/dL (ref 0.40–1.20)
GFR: 67.4 mL/min (ref >60.00)
Glucose, Bld: 86 mg/dL (ref 70–99)
Potassium: 4.4 mEq/L (ref 3.5–5.1)
Sodium: 137 mEq/L (ref 135–145)
Total Bilirubin: 0.6 mg/dL (ref 0.2–1.2)
Total Protein: 7.4 g/dL (ref 6.0–8.3)

## 2020-06-27 LAB — CBC WITH DIFFERENTIAL/PLATELET
Basophils Absolute: 0.1 10*3/uL (ref 0.0–0.1)
Basophils Relative: 0.6 % (ref 0.0–3.0)
Eosinophils Absolute: 0.5 10*3/uL (ref 0.0–0.7)
Eosinophils Relative: 5.8 % — ABNORMAL HIGH (ref 0.0–5.0)
HCT: 38.9 % (ref 36.0–46.0)
Hemoglobin: 13.2 g/dL (ref 12.0–15.0)
Lymphocytes Relative: 18.3 % (ref 12.0–46.0)
Lymphs Abs: 1.6 10*3/uL (ref 0.7–4.0)
MCHC: 33.9 g/dL (ref 30.0–36.0)
MCV: 101.1 fl — ABNORMAL HIGH (ref 78.0–100.0)
Monocytes Absolute: 0.7 10*3/uL (ref 0.1–1.0)
Monocytes Relative: 7.8 % (ref 3.0–12.0)
Neutro Abs: 5.8 10*3/uL (ref 1.4–7.7)
Neutrophils Relative %: 67.5 % (ref 43.0–77.0)
Platelets: 257 10*3/uL (ref 150.0–400.0)
RBC: 3.85 Mil/uL — ABNORMAL LOW (ref 3.87–5.11)
RDW: 12.9 % (ref 11.5–15.5)
WBC: 8.6 10*3/uL (ref 4.0–10.5)

## 2020-06-28 ENCOUNTER — Ambulatory Visit (INDEPENDENT_AMBULATORY_CARE_PROVIDER_SITE_OTHER): Payer: BLUE CROSS/BLUE SHIELD | Admitting: Cardiology

## 2020-06-28 ENCOUNTER — Ambulatory Visit (INDEPENDENT_AMBULATORY_CARE_PROVIDER_SITE_OTHER): Payer: BLUE CROSS/BLUE SHIELD

## 2020-06-28 ENCOUNTER — Other Ambulatory Visit: Payer: Self-pay

## 2020-06-28 ENCOUNTER — Encounter: Payer: Self-pay | Admitting: Cardiology

## 2020-06-28 VITALS — BP 126/90 | HR 72 | Ht 67.0 in | Wt 184.0 lb

## 2020-06-28 DIAGNOSIS — R5383 Other fatigue: Secondary | ICD-10-CM

## 2020-06-28 DIAGNOSIS — R002 Palpitations: Secondary | ICD-10-CM | POA: Insufficient documentation

## 2020-06-28 DIAGNOSIS — R42 Dizziness and giddiness: Secondary | ICD-10-CM | POA: Insufficient documentation

## 2020-06-28 DIAGNOSIS — R55 Syncope and collapse: Secondary | ICD-10-CM

## 2020-06-28 DIAGNOSIS — R011 Cardiac murmur, unspecified: Secondary | ICD-10-CM | POA: Diagnosis not present

## 2020-06-28 NOTE — Progress Notes (Signed)
Cardiology Office Note:    Date:  06/28/2020   ID:  Samantha Morales, DOB Jul 03, 1971, MRN [id number]  PCP:  Mosie Lukes, MD  Cardiologist:  Jenean Lindau, MD   Referring MD: Mackie Pai, PA-C    ASSESSMENT:    1. Murmur   2. Palpitations   3. Fatigue, unspecified type   4. Postural dizziness with presyncope   5. Cardiac murmur    PLAN:    In order of problems listed above:  1. Primary prevention stressed with the patient.  Importance of compliance with diet medication stressed and she vocalized understanding.  Fall precaution was advised. 2. Palpitations: In view of this she will have a event monitor for 2 weeks. 3. Cardiac murmur: Echocardiogram will be done to assess murmur on auscultation. 4. I told him I just prevent peripheral pooling of blood and she understands. Her MCV is elevated and I suggested her to get a vitamin B12 level and she agrees.Patient will be seen in follow-up appointment in 3 months or earlier if the patient has any concerns    Medication Adjustments/Labs and Tests Ordered: Current medicines are reviewed at length with the patient today.  Concerns regarding medicines are outlined above.  Orders Placed This Encounter  Procedures  . B12  . LONG TERM MONITOR (3-14 DAYS)  . ECHOCARDIOGRAM COMPLETE   No orders of the defined types were placed in this encounter.    History of Present Illness:    Samantha Morales is a 49 y.o. female who is being seen today for the evaluation of presyncope-like episodes at the request of [place], Percell Miller, [place].  Patient is a pleasant 49 year old female she is accompanied by her husband is very supportive.  Patient mentions to me that sometimes her legs will feel like noodles.  This can happen when she is standing or sitting down.  It happens more when she is sitting down.  If she is feeling a little dizzy at times and shaky sensation.  No orthopnea or PND.  She tells me that she has never passed out and asked this  question during multiple occasions.  At the time of my evaluation, the patient is alert awake oriented and in no distress.  Past Medical History:  Diagnosis Date  . Acute upper respiratory infection 10/26/2016  . Anemia 03/13/2015  . ANEMIA, HX OF 01/20/2011  . Atypical chest pain 04/15/2012  . Back pain 07/22/2012  . Cellulitis of ankle 05/12/2011  . CHICKENPOX, HX OF 01/20/2011  . DEPRESSION 01/20/2011  . Elevated BP 04/15/2012  . Epistaxis 10/26/2016  . GERD 01/20/2011  . Hyperglycemia 05/10/2016  . Insomnia 12/12/2012  . LACERATION OF FINGER 01/27/2011  . Overweight 01/19/2016  . Preventative health care 02/28/2014  . RESTLESS LEG SYNDROME, HX OF 01/20/2011  . Sacroiliac joint pain 07/22/2012  . Sinusitis, acute 12/12/2012  . Tobacco abuse 05/12/2011  . 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 lasik eye surgery X b/l      Current Medications: Current Meds  Medication Sig  . ALPRAZolam (XANAX) 0.25 MG tablet Take 1 tablet (0.25 mg total) by mouth 2 (two) times daily as needed for anxiety or sleep.  Marland Kitchen escitalopram (LEXAPRO) 20 MG tablet Take 1.5 tablets (30 mg total) by mouth daily.  Marland Kitchen ibuprofen (ADVIL,MOTRIN) 200 MG tablet Take 200 mg by mouth every 6 (six) hours as needed.  Marland Kitchen omeprazole (PRILOSEC) 20 MG capsule 90TAKE 1 CAPSULE BY  MOUTH EVERY DAY     Allergies:   Erythromycin, Penicillins, and Sulfonamide derivatives   Social History   Socioeconomic History  . Marital status: Married    Spouse name: Not on file  . Number of children: Not on file  . Years of education: Not on file  . Highest education level: Not on file  Occupational History  . Not on file  Tobacco Use  . Smoking status: Former Smoker    Packs/day: 0.50    Quit date: 04/12/2012    Years since quitting: 8.2  . Smokeless tobacco: Never Used  Substance and Sexual Activity  . Alcohol use: Yes    Alcohol/week: 0.0 standard drinks    Comment: 6-7 a week  .  Drug use: No  . Sexual activity: Yes    Partners: Male    Comment: no dietary, lives with husband, wears seatbelt  Other Topics Concern  . Not on file  Social History Narrative  . Not on file   Social Determinants of Health   Financial Resource Strain:   . Difficulty of Paying Living Expenses:   Food Insecurity:   . Worried About Charity fundraiser in the Last Year:   . Arboriculturist in the Last Year:   Transportation Needs:   . Film/video editor (Medical):   Marland Kitchen Lack of Transportation (Non-Medical):   Physical Activity:   . Days of Exercise per Week:   . Minutes of Exercise per Session:   Stress:   . Feeling of Stress :   Social Connections:   . Frequency of Communication with Friends and Family:   . Frequency of Social Gatherings with Friends and Family:   . Attends Religious Services:   . Active Member of Clubs or Organizations:   . Attends Archivist Meetings:   Marland Kitchen Marital Status:      Family History: The patient's family history includes Alzheimer's disease in her maternal grandmother; Anemia in her brother; Aneurysm in her maternal grandfather; Arrhythmia in her mother; Arthritis in her maternal grandfather; Cancer in her maternal grandmother; Epilepsy in her brother; Heart attack in her paternal grandfather; Hypertension in her brother and mother; Lupus in her sister; Nephrolithiasis in her paternal grandmother.  ROS:   Please see the history of present illness.    All other systems reviewed and are negative.  EKGs/Labs/Other Studies Reviewed:    The following studies were reviewed today: EKG reveals sinus rhythm short PR and nonspecific ST-T changes   Recent Labs: 02/27/2020: TSH 1.00 06/26/2020: ALT 21; BUN 16; Creatinine, Ser 0.89; Hemoglobin 13.2; Platelets 257.0; Potassium 4.4; Sodium 137  Recent Lipid Panel    Component Value Date/Time   CHOL 175 02/27/2020 1224   TRIG 56.0 02/27/2020 1224   HDL 88.70 02/27/2020 1224   CHOLHDL 2  02/27/2020 1224   VLDL 11.2 02/27/2020 1224   LDLCALC 75 02/27/2020 1224    Physical Exam:    VS:  BP 126/90   Pulse 72   Ht 5\' 7"  (1.702 m)   Wt 184 lb (83.5 kg)   SpO2 96%   BMI 28.82 kg/m     Wt Readings from Last 3 Encounters:  06/28/20 184 lb (83.5 kg)  06/26/20 184 lb (83.5 kg)  02/27/20 184 lb (83.5 kg)     GEN: Patient is in no acute distress HEENT: Normal NECK: No JVD; No carotid bruits LYMPHATICS: No lymphadenopathy CARDIAC: S1 S2 regular, 2/6 systolic murmur at the apex. RESPIRATORY:  Clear  to auscultation without rales, wheezing or rhonchi  ABDOMEN: Soft, non-tender, non-distended MUSCULOSKELETAL:  No edema; No deformity  SKIN: Warm and dry NEUROLOGIC:  Alert and oriented x 3 PSYCHIATRIC:  Normal affect    Signed, Jenean Lindau, MD  06/28/2020 10:05 AM    [place]

## 2020-06-28 NOTE — Patient Instructions (Signed)
Medication Instructions:  No medication changes. *If you need a refill on your cardiac medications before your next appointment, please call your pharmacy*   Lab Work: Your physician recommends that you have a vitamin B 12 checked today in the office.  If you have labs (blood work) drawn today and your tests are completely normal, you will receive your results only by: Marland Kitchen MyChart Message (if you have MyChart) OR . A paper copy in the mail If you have any lab test that is abnormal or we need to change your treatment, we will call you to review the results.   Testing/Procedures: Your physician has requested that you have an echocardiogram. Echocardiography is a painless test that uses sound waves to create images of your heart. It provides your doctor with information about the size and shape of your heart and how well your heart's chambers and valves are working. This procedure takes approximately one hour. There are no restrictions for this procedure.   WHY IS MY DOCTOR PRESCRIBING ZIO? The Zio system is proven and trusted by physicians to detect and diagnose irregular heart rhythms -- and has been prescribed to hundreds of thousands of patients.  The FDA has cleared the Zio system to monitor for many different kinds of irregular heart rhythms. In a study, physicians were able to reach a diagnosis 90% of the time with the Zio system1.  You can wear the Zio monitor -- a small, discreet, comfortable patch -- during your normal day-to-day activity, including while you sleep, shower, and exercise, while it records every single heartbeat for analysis.  1Barrett, P., et al. Comparison of 24 Hour Holter Monitoring Versus 14 Day Novel Adhesive Patch Electrocardiographic Monitoring. [place], 2014.  ZIO VS. HOLTER MONITORING The Zio monitor can be comfortably worn for up to 14 days. Holter monitors can be worn for 24 to 48 hours, limiting the time to record any irregular heart  rhythms you may have. Zio is able to capture data for the 51% of patients who have their first symptom-triggered arrhythmia after 48 hours.1  LIVE WITHOUT RESTRICTIONS The Zio ambulatory cardiac monitor is a small, unobtrusive, and water-resistant patch--you might even forget you're wearing it. The Zio monitor records and stores every beat of your heart, whether you're sleeping, working out, or showering.  Wear the monitor for 2 weeks, remove 07/11/20.   Follow-Up: At [facility], you and your health needs are our priority.  As part of our continuing mission to provide you with exceptional heart care, we have created designated Provider Care Teams.  These Care Teams include your primary Cardiologist (physician) and Advanced Practice Providers (APPs -  Physician Assistants and Nurse Practitioners) who all work together to provide you with the care you need, when you need it.  We recommend signing up for the patient portal called "MyChart".  Sign up information is provided on this After Visit Summary.  MyChart is used to connect with patients for Virtual Visits (Telemedicine).  Patients are able to view lab/test results, encounter notes, upcoming appointments, etc.  Non-urgent messages can be sent to your provider as well.   To learn more about what you can do with MyChart, go to [email or web address].    Your next appointment:   3 month(s)  The format for your next appointment:   In Person  Provider:   Jyl Heinz, MD   Other Instructions  Echocardiogram An echocardiogram is a procedure that uses painless sound waves (ultrasound) to produce an image of  the heart. Images from an echocardiogram can provide important information about:  Signs of coronary artery disease (CAD).  Aneurysm detection. An aneurysm is a weak or damaged part of an artery wall that bulges out from the normal force of blood pumping through the body.  Heart size and shape. Changes in the size or shape of the  heart can be associated with certain conditions, including heart failure, aneurysm, and CAD.  Heart muscle function.  Heart valve function.  Signs of a past heart attack.  Fluid buildup around the heart.  Thickening of the heart muscle.  A tumor or infectious growth around the heart valves. Tell a health care provider about:  Any allergies you have.  All medicines you are taking, including vitamins, herbs, eye drops, creams, and over-the-counter medicines.  Any blood disorders you have.  Any surgeries you have had.  Any medical conditions you have.  Whether you are pregnant or may be pregnant. What are the risks? Generally, this is a safe procedure. However, problems may occur, including:  Allergic reaction to dye (contrast) that may be used during the procedure. What happens before the procedure? No specific preparation is needed. You may eat and drink normally. What happens during the procedure?   An IV tube may be inserted into one of your veins.  You may receive contrast through this tube. A contrast is an injection that improves the quality of the pictures from your heart.  A gel will be applied to your chest.  A wand-like tool (transducer) will be moved over your chest. The gel will help to transmit the sound waves from the transducer.  The sound waves will harmlessly bounce off of your heart to allow the heart images to be captured in real-time motion. The images will be recorded on a computer. The procedure may vary among health care providers and hospitals. What happens after the procedure?  You may return to your normal, everyday life, including diet, activities, and medicines, unless your health care provider tells you not to do that. Summary  An echocardiogram is a procedure that uses painless sound waves (ultrasound) to produce an image of the heart.  Images from an echocardiogram can provide important information about the size and shape of your  heart, heart muscle function, heart valve function, and fluid buildup around your heart.  You do not need to do anything to prepare before this procedure. You may eat and drink normally.  After the echocardiogram is completed, you may return to your normal, everyday life, unless your health care provider tells you not to do that. This information is not intended to replace advice given to you by your health care provider. Make sure you discuss any questions you have with your health care provider. Document Revised: 04/06/2019 Document Reviewed: 01/16/2017 Elsevier Patient Education  [place].

## 2020-06-29 LAB — VITAMIN B12: Vitamin B-12: 871 pg/mL (ref 232–1245)

## 2020-07-04 ENCOUNTER — Ambulatory Visit: Payer: Self-pay | Admitting: Neurology

## 2020-07-12 ENCOUNTER — Telehealth (INDEPENDENT_AMBULATORY_CARE_PROVIDER_SITE_OTHER): Payer: BLUE CROSS/BLUE SHIELD | Admitting: Family Medicine

## 2020-07-12 ENCOUNTER — Other Ambulatory Visit: Payer: Self-pay

## 2020-07-12 VITALS — Temp 98.1°F | Wt 183.7 lb

## 2020-07-12 DIAGNOSIS — R202 Paresthesia of skin: Secondary | ICD-10-CM | POA: Diagnosis not present

## 2020-07-12 DIAGNOSIS — E559 Vitamin D deficiency, unspecified: Secondary | ICD-10-CM

## 2020-07-12 DIAGNOSIS — R42 Dizziness and giddiness: Secondary | ICD-10-CM

## 2020-07-12 DIAGNOSIS — M542 Cervicalgia: Secondary | ICD-10-CM | POA: Diagnosis not present

## 2020-07-12 DIAGNOSIS — R55 Syncope and collapse: Secondary | ICD-10-CM

## 2020-07-12 DIAGNOSIS — R739 Hyperglycemia, unspecified: Secondary | ICD-10-CM | POA: Diagnosis not present

## 2020-07-12 DIAGNOSIS — R002 Palpitations: Secondary | ICD-10-CM

## 2020-07-13 DIAGNOSIS — R202 Paresthesia of skin: Secondary | ICD-10-CM | POA: Insufficient documentation

## 2020-07-13 DIAGNOSIS — M542 Cervicalgia: Secondary | ICD-10-CM | POA: Insufficient documentation

## 2020-07-13 NOTE — Assessment & Plan Note (Addendum)
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 Left>Right. She has had 2 episodes of pre syncope when she raised her arms above her head. Will proceed with imaging of the neck and shehas an echocardiogram already ordered by cardiology

## 2020-07-13 NOTE — Assessment & Plan Note (Signed)
Has had a couple of episodes recently and is awaiting an appointment with neurology at [facility] on 7/22. She will report any worsening symptoms.

## 2020-07-13 NOTE — Progress Notes (Signed)
Virtual Visit via Video Note  I connected with Samantha Morales on 07/12/20 at  8:40 AM EDT by a video enabled telemedicine application and verified that I am speaking with the correct person using two identifiers.  Location: Patient: home Provider: home   I discussed the limitations of evaluation and management by telemedicine and the availability of in person appointments. The patient expressed understanding and agreed to proceed. Geanie Berlin, CMA was able to get the patient set up on a video visit    Subjective:    Patient ID: Samantha Morales, female    DOB: May 07, 1971, 49 y.o.   MRN: [id number]  Chief Complaint  Patient presents with  . Follow-up    HPI Patient is in today for follow upon chronic medical concerns. No recent febrile illness or hospitalizations. She has been struggling with increased neck pain and recurrent episodes of her arms, left>right going numb. She works as a Emergency planning/management officer and has had trouble with these type symptoms for a long time but they are becoming more frequent and more intense. No recent fall or injury. She has noted 2 episodes of raising her arms above her head and feeling as if she is going to pass out. She is scheduled to see neurology next week to further discuss. She is also noting increased palpitations and has recently worn a 14 day event monitor. Denies CP/SOB/HA/congestion/fevers/GI or GU c/o. Taking meds as prescribed  Past Medical History:  Diagnosis Date  . Acute upper respiratory infection 10/26/2016  . Anemia 03/13/2015  . ANEMIA, HX OF 01/20/2011  . Atypical chest pain 04/15/2012  . Back pain 07/22/2012  . Cellulitis of ankle 05/12/2011  . CHICKENPOX, HX OF 01/20/2011  . DEPRESSION 01/20/2011  . Elevated BP 04/15/2012  . Epistaxis 10/26/2016  . GERD 01/20/2011  . Hyperglycemia 05/10/2016  . Insomnia 12/12/2012  . LACERATION OF FINGER 01/27/2011  . Overweight 01/19/2016  . Preventative health care 02/28/2014  . RESTLESS LEG SYNDROME,  HX OF 01/20/2011  . Sacroiliac joint pain 07/22/2012  . Sinusitis, acute 12/12/2012  . Tobacco abuse 05/12/2011  . 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 lasik eye surgery X b/l      Family History  Problem Relation Age of Onset  . Hypertension Mother   . Arrhythmia Mother   . Lupus Sister   . Hypertension Brother   . Epilepsy Brother   . Cancer Maternal Grandmother        breast  . Alzheimer's disease Maternal Grandmother   . Aneurysm Maternal Grandfather        brain  . Arthritis Maternal Grandfather        rheumatoid  . Nephrolithiasis Paternal Grandmother   . Heart attack Paternal Grandfather   . Anemia Brother     Social History   Socioeconomic History  . Marital status: Married    Spouse name: Not on file  . Number of children: Not on file  . Years of education: Not on file  . Highest education level: Not on file  Occupational History  . Not on file  Tobacco Use  . Smoking status: Former Smoker    Packs/day: 0.50    Quit date: 04/12/2012    Years since quitting: 8.2  . Smokeless tobacco: Never Used  Substance and Sexual Activity  . Alcohol use: Yes    Alcohol/week: 0.0 standard drinks    Comment: 6-7 a week  . Drug use:  No  . Sexual activity: Yes    Partners: Male    Comment: no dietary, lives with husband, wears seatbelt  Other Topics Concern  . Not on file  Social History Narrative  . Not on file   Social Determinants of Health   Financial Resource Strain:   . Difficulty of Paying Living Expenses:   Food Insecurity:   . Worried About Charity fundraiser in the Last Year:   . Arboriculturist in the Last Year:   Transportation Needs:   . Film/video editor (Medical):   Marland Kitchen Lack of Transportation (Non-Medical):   Physical Activity:   . Days of Exercise per Week:   . Minutes of Exercise per Session:   Stress:   . Feeling of Stress :   Social Connections:   . Frequency of Communication  with Friends and Family:   . Frequency of Social Gatherings with Friends and Family:   . Attends Religious Services:   . Active Member of Clubs or Organizations:   . Attends Archivist Meetings:   Marland Kitchen Marital Status:   Intimate Partner Violence:   . Fear of Current or Ex-Partner:   . Emotionally Abused:   Marland Kitchen Physically Abused:   . Sexually Abused:     Outpatient Medications Prior to Visit  Medication Sig Dispense Refill  . ALPRAZolam (XANAX) 0.25 MG tablet Take 1 tablet (0.25 mg total) by mouth 2 (two) times daily as needed for anxiety or sleep. 60 tablet 3  . escitalopram (LEXAPRO) 20 MG tablet Take 1.5 tablets (30 mg total) by mouth daily. 135 tablet 1  . ibuprofen (ADVIL,MOTRIN) 200 MG tablet Take 200 mg by mouth every 6 (six) hours as needed.    Marland Kitchen omeprazole (PRILOSEC) 20 MG capsule 90TAKE 1 CAPSULE BY MOUTH EVERY DAY 90 capsule 1   No facility-administered medications prior to visit.    Allergies  Allergen Reactions  . Erythromycin     REACTION: rash  . Penicillins     REACTION: swelling, rash  . Sulfonamide Derivatives     REACTION: GI upset    Review of Systems  Constitutional: Positive for malaise/fatigue. Negative for fever.  HENT: Negative for congestion.   Eyes: Negative for blurred vision.  Respiratory: Negative for shortness of breath.   Cardiovascular: Positive for palpitations. Negative for chest pain and leg swelling.  Gastrointestinal: Negative for abdominal pain, blood in stool and nausea.  Genitourinary: Negative for dysuria and frequency.  Musculoskeletal: Negative for falls.  Skin: Negative for rash.  Neurological: Positive for dizziness and sensory change. Negative for seizures, loss of consciousness and headaches.  Endo/Heme/Allergies: Negative for environmental allergies.  Psychiatric/Behavioral: Negative for depression. The patient is not nervous/anxious.        Objective:    Physical Exam Constitutional:      Appearance: Normal  appearance. She is obese. She is not ill-appearing or toxic-appearing.  HENT:     Head: Normocephalic and atraumatic.     Right Ear: External ear normal.     Left Ear: External ear normal.     Nose: Nose normal.  Eyes:     General:        Right eye: No discharge.        Left eye: No discharge.  Pulmonary:     Effort: Pulmonary effort is normal.  Neurological:     Mental Status: She is alert and oriented to person, place, and time.  Psychiatric:  Behavior: Behavior normal.     Temp 98.1 F (36.7 C)   Wt 183 lb 11.2 oz (83.3 kg)   BMI 28.77 kg/m  Wt Readings from Last 3 Encounters:  07/12/20 183 lb 11.2 oz (83.3 kg)  06/28/20 184 lb (83.5 kg)  06/26/20 184 lb (83.5 kg)    Diabetic Foot Exam - Simple   No data filed     Lab Results  Component Value Date   WBC 8.6 06/26/2020   HGB 13.2 06/26/2020   HCT 38.9 06/26/2020   PLT 257.0 06/26/2020   GLUCOSE 86 06/26/2020   CHOL 175 02/27/2020   TRIG 56.0 02/27/2020   HDL 88.70 02/27/2020   LDLCALC 75 02/27/2020   ALT 21 06/26/2020   AST 22 06/26/2020   NA 137 06/26/2020   K 4.4 06/26/2020   CL 103 06/26/2020   CREATININE 0.89 06/26/2020   BUN 16 06/26/2020   CO2 27 06/26/2020   TSH 1.00 02/27/2020   INR 1.0 12/05/2015   HGBA1C 5.4 02/27/2020    Lab Results  Component Value Date   TSH 1.00 02/27/2020   Lab Results  Component Value Date   WBC 8.6 06/26/2020   HGB 13.2 06/26/2020   HCT 38.9 06/26/2020   MCV 101.1 (H) 06/26/2020   PLT 257.0 06/26/2020   Lab Results  Component Value Date   NA 137 06/26/2020   K 4.4 06/26/2020   CO2 27 06/26/2020   GLUCOSE 86 06/26/2020   BUN 16 06/26/2020   CREATININE 0.89 06/26/2020   BILITOT 0.6 06/26/2020   ALKPHOS 79 06/26/2020   AST 22 06/26/2020   ALT 21 06/26/2020   PROT 7.4 06/26/2020   ALBUMIN 4.6 06/26/2020   CALCIUM 9.8 06/26/2020   GFR 67.40 06/26/2020   Lab Results  Component Value Date   CHOL 175 02/27/2020   Lab Results  Component Value  Date   HDL 88.70 02/27/2020   Lab Results  Component Value Date   LDLCALC 75 02/27/2020   Lab Results  Component Value Date   TRIG 56.0 02/27/2020   Lab Results  Component Value Date   CHOLHDL 2 02/27/2020   Lab Results  Component Value Date   HGBA1C 5.4 02/27/2020       Assessment & Plan:   Problem List Items Addressed This Visit    Vitamin D deficiency    Supplement andmonitor      Hyperglycemia    hgba1c acceptable, minimize simple carbs. Increase exercise as tolerated.       Palpitations    She is under the care of cardiology and already had 14 day event which did not show any sinister rhythms. Since she continues to have symptoms she is encouraged to get a Jackelyn Poling mobile to monitor her symptoms      Postural dizziness with presyncope    Has had a couple of episodes recently and is awaiting an appointment with neurology at [facility] on 7/22. She will report any worsening symptoms.       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 Left>Right. She has had 2 episodes of pre syncope when she raised her arms above her head. Will proceed with imaging of the neck and shehas an echocardiogram already ordered by cardiology      Relevant Orders   DG Cervical Spine 2  or 3 views   Paresthesias   Relevant Orders   DG Cervical Spine 2 or 3 views      I am having Samantha Morales maintain her ibuprofen, ALPRAZolam, escitalopram, and omeprazole.  No orders of the defined types were placed in this encounter.   I discussed the assessment and treatment plan with the patient. The patient was provided an opportunity to ask questions and all were answered. The patient agreed with the plan and demonstrated an understanding of the instructions.   The patient was advised to call back or seek an in-person evaluation if the symptoms worsen or if the  condition fails to improve as anticipated.  I provided 25 minutes of non-face-to-face time during this encounter.   Penni Homans, MD

## 2020-07-13 NOTE — Assessment & Plan Note (Signed)
Supplement and monitor 

## 2020-07-13 NOTE — Assessment & Plan Note (Signed)
She is under the care of cardiology and already had 14 day event which did not show any sinister rhythms. Since she continues to have symptoms she is encouraged to get a [place] mobile to monitor her symptoms

## 2020-07-13 NOTE — Assessment & Plan Note (Signed)
hgba1c acceptable, minimize simple carbs. Increase exercise as tolerated.  

## 2020-07-18 ENCOUNTER — Other Ambulatory Visit: Payer: Self-pay

## 2020-07-18 ENCOUNTER — Encounter: Payer: Self-pay | Admitting: Neurology

## 2020-07-18 ENCOUNTER — Ambulatory Visit (INDEPENDENT_AMBULATORY_CARE_PROVIDER_SITE_OTHER): Payer: BLUE CROSS/BLUE SHIELD | Admitting: Neurology

## 2020-07-18 VITALS — BP 121/78 | HR 79 | Ht 67.0 in | Wt 185.0 lb

## 2020-07-18 DIAGNOSIS — R29898 Other symptoms and signs involving the musculoskeletal system: Secondary | ICD-10-CM | POA: Diagnosis not present

## 2020-07-18 DIAGNOSIS — R270 Ataxia, unspecified: Secondary | ICD-10-CM | POA: Diagnosis not present

## 2020-07-18 DIAGNOSIS — R29818 Other symptoms and signs involving the nervous system: Secondary | ICD-10-CM

## 2020-07-18 DIAGNOSIS — G709 Myoneural disorder, unspecified: Secondary | ICD-10-CM

## 2020-07-18 DIAGNOSIS — R55 Syncope and collapse: Secondary | ICD-10-CM

## 2020-07-18 DIAGNOSIS — Z9289 Personal history of other medical treatment: Secondary | ICD-10-CM | POA: Diagnosis not present

## 2020-07-18 DIAGNOSIS — W19XXXA Unspecified fall, initial encounter: Secondary | ICD-10-CM

## 2020-07-18 DIAGNOSIS — R292 Abnormal reflex: Secondary | ICD-10-CM

## 2020-07-18 NOTE — Patient Instructions (Addendum)
MRI of the brain and cervical spine EEG routine(in office) then extended at home If we can't explain this, I would recommend discussing with your primary care a referral to [facility] or [place] neurology

## 2020-07-18 NOTE — Progress Notes (Signed)
[facility]    Provider:  Dr Jaynee Eagles Requesting Provider: Saguier,Edward PA Primary Care Provider:  Mosie Lukes, MD  CC:  Acute weakness, tremor, imbalance and dizziness  HPI:  Samantha Morales is a 49 y.o. female here as requested by Saguier,Edward PAfor near syncope, dizziness, weakness of both legs. PMHx tobacco abuse, sacroiliac joint pain, restless legs, insomnia, hyperglycemia, depression, back pain, postural dizziness with presyncope, cervical disc disorder with radiculopathy of the cervical region,.  I reviewed Saguier,Edward PA's notes: Patient stated she was having dizziness recently, she feels like she loses control of her upper and lower limbs like noodles, she never passes out, she feels extreme weakness in her legs prior to the symptoms, she has been conscious during these episodes, 25 years ago she had heart monitor and MRI both were negative never had an EEG.  Patient stated weeks prior she grabbed a door handle and says she felt her head shaking she was aware, she was also walking and felt weak in both upper and lower extremities her head was shaking and she slid down the wall but did not lose consciousness, no episodes of eye pain or blurred vision.  Ongoing symptoms for 40 years since a little girl, she had multiple appointments. She would be walking to class and just "hit the ground". I walked in and she says that she just acutely had muscle weakness right now, "my muscles aren't working right now". Her exam is non physiologic with poor effort and appears A few minutes later she states she is fine. The muscle weakness ongoing all her life. 2 months ago started having head shaking which she says are 2 different things the shaking happens when her arm is raised and all of sudden her head started shaking, she sat down but it is like she is in a bubble but everything is distant and no confusion afterwards or loss of bowel or bladder. She says recently she reached  out, head started shaking and she lost control and slid down the wall. She is not sleepy during the day, doesn't happen with extreme emotions. Every few days she has an episode of muscle weakness and lasts a few minutes she is "dead weight", sometimes in the morning when getting out of bed she feels it, usually when sitting or laying down, she is also having dizziness when she stands up and gets dizzy but the room does not spin. As she gets older it gets worse. It is all the limbs equally and she says she has fallen due to her legs not holding her. She is also ataxic when she is walking when it happens imbalance and has to walk with legs stiff and wide based. Happening every few days, lasts 10 minutes or less until she can move around. Her arms and legs are involved at the same time, at other times her arms go to sleep and tingling in the mid arm. No other focal neurologic deficits, associated symptoms, inciting events or modifiable factors.  Reviewed notes, labs and imaging from outside physicians, which showed: cervical spine reviewed images and agree:   The cervical spine is visualized to the level of T1.  The vertebral body heights are maintained. The alignment is normal. The prevertebral soft tissues are normal. There is no acute fracture or static listhesis. There is mild degenerative disc disease at C5-6 with small discogenic endplate osteophytes. Bilateral neural foramina are patent. Mild right uncovertebral degenerative change at C4-5. Mild left uncovertebral degenerative change at C6-7.  IMPRESSION: Mild cervical spondylosis as described above.  EMG/NCS: 2015 Dr Posey Pronto reviewed data and report:  NCV & EMG Findings: Electrodiagnostic testing of the right upper extremity shows:  1. Normal median, ulnar, radial, and palmar sensory responses.  2. Normal median, ulnar, and radial motor responses.  3. Mild chronic motor axon loss changes are seen affecting C8 myotomes on the right,  without accompanied active denervation.   4.  B12,hgba1c,tsh and CMP normal, cbc unremarkable,   Review of Systems: Patient complains of symptoms per HPI as well as the following symptoms: weakness, neck and joint pain. Pertinent negatives and positives per HPI. All others negative.   Social History   Socioeconomic History  . Marital status: Married    Spouse name: Not on file  . Number of children: Not on file  . Years of education: Not on file  . Highest education level: Not on file  Occupational History  . Not on file  Tobacco Use  . Smoking status: Current Every Day Smoker    Packs/day: 0.50    Last attempt to quit: 04/12/2012    Years since quitting: 8.2  . Smokeless tobacco: Never Used  Substance and Sexual Activity  . Alcohol use: Yes    Alcohol/week: 0.0 standard drinks    Comment: socially  . Drug use: No  . Sexual activity: Yes    Partners: Male    Comment: no dietary, lives with husband, wears seatbelt  Other Topics Concern  . Not on file  Social History Narrative  . Not on file   Social Determinants of Health   Financial Resource Strain:   . Difficulty of Paying Living Expenses:   Food Insecurity:   . Worried About Charity fundraiser in the Last Year:   . Arboriculturist in the Last Year:   Transportation Needs:   . Film/video editor (Medical):   Marland Kitchen Lack of Transportation (Non-Medical):   Physical Activity:   . Days of Exercise per Week:   . Minutes of Exercise per Session:   Stress:   . Feeling of Stress :   Social Connections:   . Frequency of Communication with Friends and Family:   . Frequency of Social Gatherings with Friends and Family:   . Attends Religious Services:   . Active Member of Clubs or Organizations:   . Attends Archivist Meetings:   Marland Kitchen Marital Status:   Intimate Partner Violence:   . Fear of Current or Ex-Partner:   . Emotionally Abused:   Marland Kitchen Physically Abused:   . Sexually Abused:     Family History    Problem Relation Age of Onset  . Hypertension Mother   . Arrhythmia Mother   . Lupus Sister   . Hypertension Brother   . Epilepsy Brother   . Cancer Maternal Grandmother        breast  . Alzheimer's disease Maternal Grandmother   . Aneurysm Maternal Grandfather        brain  . Arthritis Maternal Grandfather        rheumatoid  . Nephrolithiasis Paternal Grandmother   . Heart attack Paternal Grandfather   . Anemia Brother   . Neuromuscular disorder Neg Hx     Past Medical History:  Diagnosis Date  . Acute upper respiratory infection 10/26/2016  . Anemia 03/13/2015  . ANEMIA, HX OF 01/20/2011  . Atypical chest pain 04/15/2012  . Back pain 07/22/2012  . Cellulitis of ankle 05/12/2011  . CHICKENPOX, HX OF  01/20/2011  . DEPRESSION 01/20/2011  . Elevated BP 04/15/2012  . Epistaxis 10/26/2016  . GERD 01/20/2011  . Hyperglycemia 05/10/2016  . Insomnia 12/12/2012  . LACERATION OF FINGER 01/27/2011  . Overweight 01/19/2016  . Preventative health care 02/28/2014  . RESTLESS LEG SYNDROME, HX OF 01/20/2011  . Sacroiliac joint pain 07/22/2012  . Sinusitis, acute 12/12/2012  . Tobacco abuse 05/12/2011  . Tobacco abuse, in remission 05/12/2011    Patient Active Problem List   Diagnosis Date Noted  . Neck pain 07/13/2020  . Paresthesias 07/13/2020  . Palpitations 06/28/2020  . Postural dizziness with presyncope 06/28/2020  . Cardiac murmur 06/28/2020  . Onychomycosis 07/03/2018  . Chronic left shoulder pain 07/03/2018  . Epistaxis 10/26/2016  . Hyperglycemia 05/10/2016  . Overweight 01/19/2016  . Menorrhagia with irregular cycle 12/05/2015  . Anemia 03/13/2015  . Cervical disc disorder with radiculopathy of cervical region 11/26/2014  . Nonallopathic lesion of cervical region 11/26/2014  . Nonallopathic lesion of thoracic region 11/26/2014  . Nonallopathic lesion-rib cage 11/26/2014  . 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 Lateral epicondylitis 09/14/2014  . Vitamin D  deficiency 09/03/2014  . Impetigo 09/03/2014  . Preventative health care 02/28/2014  . Insomnia 12/12/2012  . Back pain 07/22/2012  . Atypical chest pain 04/15/2012  . Elevated BP 04/15/2012  . Tobacco abuse, in remission 05/12/2011  . Depression with anxiety 01/20/2011  . GERD 01/20/2011  . RESTLESS LEG SYNDROME, HX OF 01/20/2011  . CHICKENPOX, HX OF 01/20/2011    Past Surgical History:  Procedure Laterality Date  . lasik eye surgery X b/l      Current Outpatient Medications  Medication Sig Dispense Refill  . ALPRAZolam (XANAX) 0.25 MG tablet Take 1 tablet (0.25 mg total) by mouth 2 (two) times daily as needed for anxiety or sleep. 60 tablet 3  . escitalopram (LEXAPRO) 20 MG tablet Take 1.5 tablets (30 mg total) by mouth daily. 135 tablet 1  . ibuprofen (ADVIL,MOTRIN) 200 MG tablet Take 200 mg by mouth every 6 (six) hours as needed.    Marland Kitchen omeprazole (PRILOSEC) 20 MG capsule 90TAKE 1 CAPSULE BY MOUTH EVERY DAY 90 capsule 1   No current facility-administered medications for this visit.    Allergies as of 07/18/2020 - Review Complete 07/18/2020  Allergen Reaction Noted  . Erythromycin  01/20/2011  . Penicillins  01/20/2011  . Sulfonamide derivatives  01/20/2011    Vitals: BP 121/78 (BP Location: Left Arm, Patient Position: Standing)   Pulse 79   Ht 5\' 7"  (1.702 m)   Wt 185 lb (83.9 kg)   BMI 28.98 kg/m  Last Weight:  Wt Readings from Last 1 Encounters:  07/18/20 185 lb (83.9 kg)   Last Height:   Ht Readings from Last 1 Encounters:  07/18/20 5\' 7"  (1.702 m)     Physical exam: Exam: Gen: NAD, conversant, well nourised, overweight, well groomed                     CV: RRR, no MRG. No Carotid Bruits. No peripheral edema, warm, nontender Eyes: Conjunctivae clear without exudates or hemorrhage  Neuro: Detailed Neurologic Exam  Speech:    Speech is normal; fluent and spontaneous with normal comprehension.  Cognition:    The patient is oriented to person, place,  and time;     recent and remote memory intact;     language fluent;     normal attention, concentration,     fund of knowledge Cranial Nerves:  The pupils are equal, round, and reactive to light. The fundi are flat. Visual fields are full to finger confrontation. Extraocular movements are intact. Trigeminal sensation is intact and the muscles of mastication are normal. The face is symmetric. The palate elevates in the midline. Hearing intact. Voice is normal. Shoulder shrug is normal. The tongue has normal motion without fasciculations.   Coordination:    Normal finger to nose and heel to shin.   Gait:    Heel-toe and tandem gait are normal with slight imbalance  Motor Observation:    No asymmetry, no atrophy, and no involuntary movements noted. Tone:    Normal muscle tone.  +Romberg.   Posture:    Posture is normal. normal erect    Strength:    Strength is V/V in the upper and lower limbs.      Sensation: intact to LT     Reflex Exam:  DTR's:    Deep tendon reflexes in the upper and lower extremities are brisk bilaterally.   Toes:    The toes are downgoing bilaterally.   Clonus:    Clonus is absent.    Assessment/Plan:  49 y.o. female here as requested by Saguier,Edward PA for near syncope, dizziness, weakness of both legs. PMHx tobacco abuse, sacroiliac joint pain, restless legs, insomnia, hyperglycemia, depression, back pain, postural dizziness with presyncope, cervical disc disorder with radiculopathy of the cervical region.  - She has episodes of complete muscle weakness, tremors, dizziness, sensory changes, I really do not know of any neurologic condition that can explain all of these things but patient needs to a thorough evaluation with MRI of the brain and cervical spine and EEG, need to rule out demyelinating disease, cervical myelopathy or any other etiologies.   - I walked in and she says that she just acutely had muscle weakness, "my muscles aren't working  right now". At this time she has generalized weakness and giveway, exam appears functional. A few minutes later she states she is fine and her exam is normal.   -Patient is having  weakness in the arms and legs, shaking of the head, conscious during all of the events, not consistent with seizures however may be nonepileptic events. Her brother does have epilepsy so we will check a routine EEG and then a 48-hour eeg and see if we can catch an episode. No Symptoms of Narcolepsy to think this is cataplexy.  - emg/ncs: Had emg/ncs in 2015 with chronic C8 changes but no evidence for other muscle or nerve disease.  - If we can't explain this, I would recommend discussing with your primary care a referral to [facility] or [facility]  - fall precautions  Orders Placed This Encounter  Procedures  . MR BRAIN W WO CONTRAST  . MR CERVICAL SPINE W WO CONTRAST  . EEG   No orders of the defined types were placed in this encounter.   Cc: Mosie Lukes, MD,  Saguier,Edward PA  Sarina Ill, MD  [facility] [address] [place], [place] [address]  Phone [phone] Fax [phone]

## 2020-07-23 ENCOUNTER — Telehealth: Payer: Self-pay | Admitting: Neurology

## 2020-07-23 NOTE — Telephone Encounter (Signed)
no to the covid questions MR Brain w/wo contrast & MR Cervical spine w/wo contrast Dr. Ihor Dow Auth: [id number] (exp. 07/23/20 to 01/18/21). Patient is scheduled at [facility] for 07/31/20.

## 2020-07-24 DIAGNOSIS — R002 Palpitations: Secondary | ICD-10-CM | POA: Diagnosis not present

## 2020-07-26 NOTE — Progress Notes (Signed)
Pt has been made aware of normal result and verbalized understanding.  jw

## 2020-07-31 ENCOUNTER — Ambulatory Visit: Payer: BLUE CROSS/BLUE SHIELD

## 2020-07-31 ENCOUNTER — Other Ambulatory Visit: Payer: Self-pay

## 2020-07-31 ENCOUNTER — Ambulatory Visit (HOSPITAL_BASED_OUTPATIENT_CLINIC_OR_DEPARTMENT_OTHER)
Admission: RE | Admit: 2020-07-31 | Discharge: 2020-07-31 | Disposition: A | Discharge status: Home / Self Care | Payer: BLUE CROSS/BLUE SHIELD | Source: Ambulatory Visit | Attending: Cardiology | Admitting: Cardiology

## 2020-07-31 DIAGNOSIS — Z9289 Personal history of other medical treatment: Secondary | ICD-10-CM

## 2020-07-31 DIAGNOSIS — R29818 Other symptoms and signs involving the nervous system: Secondary | ICD-10-CM

## 2020-07-31 DIAGNOSIS — R270 Ataxia, unspecified: Secondary | ICD-10-CM | POA: Diagnosis not present

## 2020-07-31 DIAGNOSIS — R29898 Other symptoms and signs involving the musculoskeletal system: Secondary | ICD-10-CM | POA: Diagnosis not present

## 2020-07-31 DIAGNOSIS — R002 Palpitations: Secondary | ICD-10-CM | POA: Insufficient documentation

## 2020-07-31 DIAGNOSIS — G709 Myoneural disorder, unspecified: Secondary | ICD-10-CM

## 2020-07-31 DIAGNOSIS — R55 Syncope and collapse: Secondary | ICD-10-CM

## 2020-07-31 DIAGNOSIS — R292 Abnormal reflex: Secondary | ICD-10-CM

## 2020-07-31 DIAGNOSIS — W19XXXA Unspecified fall, initial encounter: Secondary | ICD-10-CM

## 2020-07-31 LAB — ECHOCARDIOGRAM COMPLETE
Area-P 1/2: 2.83 cm2
S' Lateral: 3.09 cm

## 2020-07-31 MED ORDER — GADOBENATE DIMEGLUMINE 529 MG/ML IV SOLN
15.0000 mL | Freq: Once | INTRAVENOUS | Status: AC | PRN
Start: 2020-07-31 — End: 2020-07-31
  Administered 2020-07-31: 15 mL via INTRAVENOUS

## 2020-08-14 ENCOUNTER — Other Ambulatory Visit: Payer: BLUE CROSS/BLUE SHIELD

## 2020-08-22 ENCOUNTER — Telehealth: Payer: Self-pay | Admitting: Family Medicine

## 2020-08-22 ENCOUNTER — Other Ambulatory Visit: Payer: Self-pay | Admitting: Family Medicine

## 2020-08-22 MED ORDER — ONDANSETRON HCL 4 MG PO TABS: 4.0000 mg | ORAL_TABLET | Freq: Three times a day (TID) | ORAL | 1 refills | Status: DC | PRN

## 2020-08-22 NOTE — Telephone Encounter (Signed)
Sent in Zofran

## 2020-08-22 NOTE — Telephone Encounter (Signed)
Patient wants nausea med and work note.

## 2020-08-22 NOTE — Telephone Encounter (Signed)
New Message:   Pt is calling and states she tested positive for covid last week and was told to quarantine for 14 days. Pt states it has only been 10 days and he job is requesting for her to come back. She states she can barely hold her head up and states she doesn't feel like she can stand. Pt would like a note for work sent to her email so she can send it to her employer. Please advise email address is below.   [email or web address]

## 2020-08-22 NOTE — Telephone Encounter (Signed)
New message:   Pt is calling back to see if she can get some nausea medicine due to a new onset symptom of her covid. Pt would like it to go to [id number] - [place], [place] - [address] AT [address]. Please advise.

## 2020-08-23 NOTE — Telephone Encounter (Signed)
Patient picked up nausea med and work note emailed to patient

## 2020-09-05 ENCOUNTER — Other Ambulatory Visit: Payer: Self-pay

## 2020-09-05 ENCOUNTER — Ambulatory Visit: Payer: BLUE CROSS/BLUE SHIELD | Admitting: Family Medicine

## 2020-09-05 DIAGNOSIS — M5010 Cervical disc disorder with radiculopathy, unspecified cervical region: Secondary | ICD-10-CM

## 2020-09-05 DIAGNOSIS — U071 COVID-19: Secondary | ICD-10-CM | POA: Diagnosis not present

## 2020-09-05 DIAGNOSIS — R739 Hyperglycemia, unspecified: Secondary | ICD-10-CM | POA: Diagnosis not present

## 2020-09-05 DIAGNOSIS — E559 Vitamin D deficiency, unspecified: Secondary | ICD-10-CM | POA: Diagnosis not present

## 2020-09-05 DIAGNOSIS — F418 Other specified anxiety disorders: Secondary | ICD-10-CM | POA: Diagnosis not present

## 2020-09-05 MED ORDER — ESCITALOPRAM OXALATE 20 MG PO TABS: 20.0000 mg | ORAL_TABLET | Freq: Every day | ORAL | 1 refills | Status: DC

## 2020-09-05 MED ORDER — ALPRAZOLAM 0.25 MG PO TABS: 0.2500 mg | ORAL_TABLET | Freq: Two times a day (BID) | ORAL | 3 refills | Status: DC | PRN

## 2020-09-05 NOTE — Patient Instructions (Signed)
COVID-19 COVID-19 is a respiratory infection that is caused by a virus called severe acute respiratory syndrome coronavirus 2 (SARS-CoV-2). The disease is also known as coronavirus disease or novel coronavirus. In some people, the virus may not cause any symptoms. In others, it may cause a serious infection. The infection can get worse quickly and can lead to complications, such as:  Pneumonia, or infection of the lungs.  Acute respiratory distress syndrome or ARDS. This is a condition in which fluid build-up in the lungs prevents the lungs from filling with air and passing oxygen into the blood.  Acute respiratory failure. This is a condition in which there is not enough oxygen passing from the lungs to the body or when carbon dioxide is not passing from the lungs out of the body.  Sepsis or septic shock. This is a serious bodily reaction to an infection.  Blood clotting problems.  Secondary infections due to bacteria or fungus.  Organ failure. This is when your body's organs stop working. The virus that causes COVID-19 is contagious. This means that it can spread from person to person through droplets from coughs and sneezes (respiratory secretions). What are the causes? This illness is caused by a virus. You may catch the virus by:  Breathing in droplets from an infected person. Droplets can be spread by a person breathing, speaking, singing, coughing, or sneezing.  Touching something, like a table or a doorknob, that was exposed to the virus (contaminated) and then touching your mouth, nose, or eyes. What increases the risk? Risk for infection You are more likely to be infected with this virus if you:  Are within 6 feet (2 meters) of a person with COVID-19.  Provide care for or live with a person who is infected with COVID-19.  Spend time in crowded indoor spaces or live in shared housing. Risk for serious illness You are more likely to become seriously ill from the virus if you:   Are 50 years of age or older. The higher your age, the more you are at risk for serious illness.  Live in a nursing home or long-term care facility.  Have cancer.  Have a long-term (chronic) disease such as: ? Chronic lung disease, including chronic obstructive pulmonary disease or asthma. ? A long-term disease that lowers your body's ability to fight infection (immunocompromised). ? Heart disease, including heart failure, a condition in which the arteries that lead to the heart become narrow or blocked (coronary artery disease), a disease which makes the heart muscle thick, weak, or stiff (cardiomyopathy). ? Diabetes. ? Chronic kidney disease. ? Sickle cell disease, a condition in which red blood cells have an abnormal "sickle" shape. ? Liver disease.  Are obese. What are the signs or symptoms? Symptoms of this condition can range from mild to severe. Symptoms may appear any time from 2 to 14 days after being exposed to the virus. They include:  A fever or chills.  A cough.  Difficulty breathing.  Headaches, body aches, or muscle aches.  Runny or stuffy (congested) nose.  A sore throat.  New loss of taste or smell. Some people may also have stomach problems, such as nausea, vomiting, or diarrhea. Other people may not have any symptoms of COVID-19. How is this diagnosed? This condition may be diagnosed based on:  Your signs and symptoms, especially if: ? You live in an area with a COVID-19 outbreak. ? You recently traveled to or from an area where the virus is common. ? You   provide care for or live with a person who was diagnosed with COVID-19. ? You were exposed to a person who was diagnosed with COVID-19.  A physical exam.  Lab tests, which may include: ? Taking a sample of fluid from the back of your nose and throat (nasopharyngeal fluid), your nose, or your throat using a swab. ? A sample of mucus from your lungs (sputum). ? Blood tests.  Imaging tests, which  may include, X-rays, CT scan, or ultrasound. How is this treated? At present, there is no medicine to treat COVID-19. Medicines that treat other diseases are being used on a trial basis to see if they are effective against COVID-19. Your health care provider will talk with you about ways to treat your symptoms. For most people, the infection is mild and can be managed at home with rest, fluids, and over-the-counter medicines. Treatment for a serious infection usually takes places in a hospital intensive care unit (ICU). It may include one or more of the following treatments. These treatments are given until your symptoms improve.  Receiving fluids and medicines through an IV.  Supplemental oxygen. Extra oxygen is given through a tube in the nose, a face mask, or a hood.  Positioning you to lie on your stomach (prone position). This makes it easier for oxygen to get into the lungs.  Continuous positive airway pressure (CPAP) or bi-level positive airway pressure (BPAP) machine. This treatment uses mild air pressure to keep the airways open. A tube that is connected to a motor delivers oxygen to the body.  Ventilator. This treatment moves air into and out of the lungs by using a tube that is placed in your windpipe.  Tracheostomy. This is a procedure to create a hole in the neck so that a breathing tube can be inserted.  Extracorporeal membrane oxygenation (ECMO). This procedure gives the lungs a chance to recover by taking over the functions of the heart and lungs. It supplies oxygen to the body and removes carbon dioxide. Follow these instructions at home: Lifestyle  If you are sick, stay home except to get medical care. Your health care provider will tell you how long to stay home. Call your health care provider before you go for medical care.  Rest at home as told by your health care provider.  Do not use any products that contain nicotine or tobacco, such as cigarettes, e-cigarettes, and  chewing tobacco. If you need help quitting, ask your health care provider.  Return to your normal activities as told by your health care provider. Ask your health care provider what activities are safe for you. General instructions  Take over-the-counter and prescription medicines only as told by your health care provider.  Drink enough fluid to keep your urine pale yellow.  Keep all follow-up visits as told by your health care provider. This is important. How is this prevented?  There is no vaccine to help prevent COVID-19 infection. However, there are steps you can take to protect yourself and others from this virus. To protect yourself:   Do not travel to areas where COVID-19 is a risk. The areas where COVID-19 is reported change often. To identify high-risk areas and travel restrictions, check the CDC travel website: wwwnc.cdc.gov/travel/notices  If you live in, or must travel to, an area where COVID-19 is a risk, take precautions to avoid infection. ? Stay away from people who are sick. ? Wash your hands often with soap and water for 20 seconds. If soap and water   are not available, use an alcohol-based hand sanitizer. ? Avoid touching your mouth, face, eyes, or nose. ? Avoid going out in public, follow guidance from your state and local health authorities. ? If you must go out in public, wear a cloth face covering or face mask. Make sure your mask covers your nose and mouth. ? Avoid crowded indoor spaces. Stay at least 6 feet (2 meters) away from others. ? Disinfect objects and surfaces that are frequently touched every day. This may include:  Counters and tables.  Doorknobs and light switches.  Sinks and faucets.  Electronics, such as phones, remote controls, keyboards, computers, and tablets. To protect others: If you have symptoms of COVID-19, take steps to prevent the virus from spreading to others.  If you think you have a COVID-19 infection, contact your health care  provider right away. Tell your health care team that you think you may have a COVID-19 infection.  Stay home. Leave your house only to seek medical care. Do not use public transport.  Do not travel while you are sick.  Wash your hands often with soap and water for 20 seconds. If soap and water are not available, use alcohol-based hand sanitizer.  Stay away from other members of your household. Let healthy household members care for children and pets, if possible. If you have to care for children or pets, wash your hands often and wear a mask. If possible, stay in your own room, separate from others. Use a different bathroom.  Make sure that all people in your household wash their hands well and often.  Cough or sneeze into a tissue or your sleeve or elbow. Do not cough or sneeze into your hand or into the air.  Wear a cloth face covering or face mask. Make sure your mask covers your nose and mouth. Where to find more information  Centers for Disease Control and Prevention: www.cdc.gov/coronavirus/2019-ncov/index.html  World Health Organization: www.who.int/health-topics/coronavirus Contact a health care provider if:  You live in or have traveled to an area where COVID-19 is a risk and you have symptoms of the infection.  You have had contact with someone who has COVID-19 and you have symptoms of the infection. Get help right away if:  You have trouble breathing.  You have pain or pressure in your chest.  You have confusion.  You have bluish lips and fingernails.  You have difficulty waking from sleep.  You have symptoms that get worse. These symptoms may represent a serious problem that is an emergency. Do not wait to see if the symptoms will go away. Get medical help right away. Call your local emergency services (911 in the [place] Do not drive yourself to the hospital. Let the emergency medical personnel know if you think you have COVID-19. Summary  COVID-19 is a  respiratory infection that is caused by a virus. It is also known as coronavirus disease or novel coronavirus. It can cause serious infections, such as pneumonia, acute respiratory distress syndrome, acute respiratory failure, or sepsis.  The virus that causes COVID-19 is contagious. This means that it can spread from person to person through droplets from breathing, speaking, singing, coughing, or sneezing.  You are more likely to develop a serious illness if you are 50 years of age or older, have a weak immune system, live in a nursing home, or have chronic disease.  There is no medicine to treat COVID-19. Your health care provider will talk with you about ways to treat your symptoms.    Take steps to protect yourself and others from infection. Wash your hands often and disinfect objects and surfaces that are frequently touched every day. Stay away from people who are sick and wear a mask if you are sick. This information is not intended to replace advice given to you by your health care provider. Make sure you discuss any questions you have with your health care provider. Document Revised: 10/13/2019 Document Reviewed: 01/19/2019 Elsevier Patient Education  2020 [employer] Inc.  

## 2020-09-09 DIAGNOSIS — Z8616 Personal history of COVID-19: Secondary | ICD-10-CM | POA: Insufficient documentation

## 2020-09-09 DIAGNOSIS — U071 COVID-19: Secondary | ICD-10-CM | POA: Insufficient documentation

## 2020-09-09 NOTE — Assessment & Plan Note (Signed)
Encouraged moist heat and gentle stretching as tolerated. May try NSAIDs and prescription meds as directed and report if symptoms worsen or seek immediate care 

## 2020-09-09 NOTE — Progress Notes (Signed)
Subjective:    Patient ID: Samantha Morales, female    DOB: 03-28-1971, 49 y.o.   MRN: [id number]  Chief Complaint  Patient presents with  . 6 month followup    HPI Patient is in today for follow up on chronic medical concerns. No recent febrile illness or hospitalizations. 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 She struggles with neck and shoulder pain as a result. Denies CP/palp/SOB/HA/congestion/fevers/GI or GU c/o. Taking meds as prescribed  Past Medical History:  Diagnosis Date  . Acute upper respiratory infection 10/26/2016  . Anemia 03/13/2015  . ANEMIA, HX OF 01/20/2011  . Atypical chest pain 04/15/2012  . Back pain 07/22/2012  . Cellulitis of ankle 05/12/2011  . CHICKENPOX, HX OF 01/20/2011  . DEPRESSION 01/20/2011  . Elevated BP 04/15/2012  . Epistaxis 10/26/2016  . GERD 01/20/2011  . Hyperglycemia 05/10/2016  . Insomnia 12/12/2012  . LACERATION OF FINGER 01/27/2011  . Overweight 01/19/2016  . Preventative health care 02/28/2014  . RESTLESS LEG SYNDROME, HX OF 01/20/2011  . Sacroiliac joint pain 07/22/2012  . Sinusitis, acute 12/12/2012  . Tobacco abuse 05/12/2011  . 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 lasik eye surgery X b/l      Family History  Problem Relation Age of Onset  . Hypertension Mother   . Arrhythmia Mother   . Lupus Sister   . Hypertension Brother   . Epilepsy Brother   . Cancer Maternal Grandmother        breast  . Alzheimer's disease Maternal Grandmother   . Aneurysm Maternal Grandfather        brain  . Arthritis Maternal Grandfather        rheumatoid  . Nephrolithiasis Paternal Grandmother   . Heart attack Paternal Grandfather   . Anemia Brother   . Neuromuscular disorder Neg Hx     Social History   Socioeconomic History  . Marital status: Married    Spouse name: Not on file  . Number of children: Not on file  .  Years of education: Not on file  . Highest education level: Not on file  Occupational History  . Not on file  Tobacco Use  . Smoking status: Current Every Day Smoker    Packs/day: 0.50    Last attempt to quit: 04/12/2012    Years since quitting: 8.4  . Smokeless tobacco: Never Used  Substance and Sexual Activity  . Alcohol use: Yes    Alcohol/week: 0.0 standard drinks    Comment: socially  . Drug use: No  . Sexual activity: Yes    Partners: Male    Comment: no dietary, lives with husband, wears seatbelt  Other Topics Concern  . Not on file  Social History Narrative  . Not on file   Social Determinants of Health   Financial Resource Strain:   . Difficulty of Paying Living Expenses: Not on file  Food Insecurity:   . Worried About Charity fundraiser in the Last Year: Not on file  . Ran Out of Food in the Last Year: Not on file  Transportation Needs:   . Lack of Transportation (Medical): Not on file  . Lack of Transportation (Non-Medical): Not on file  Physical Activity:   . Days of Exercise per Week: Not on file  . Minutes of Exercise per Session: Not on file  Stress:   .  Feeling of Stress : Not on file  Social Connections:   . Frequency of Communication with Friends and Family: Not on file  . Frequency of Social Gatherings with Friends and Family: Not on file  . Attends Religious Services: Not on file  . Active Member of Clubs or Organizations: Not on file  . Attends Archivist Meetings: Not on file  . Marital Status: Not on file  Intimate Partner Violence:   . Fear of Current or Ex-Partner: Not on file  . Emotionally Abused: Not on file  . Physically Abused: Not on file  . Sexually Abused: Not on file    Outpatient Medications Prior to Visit  Medication Sig Dispense Refill  . ibuprofen (ADVIL,MOTRIN) 200 MG tablet Take 200 mg by mouth every 6 (six) hours as needed.    Marland Kitchen omeprazole (PRILOSEC) 20 MG capsule 90TAKE 1 CAPSULE BY MOUTH EVERY DAY 90 capsule 1   . ondansetron (ZOFRAN) 4 MG tablet Take 1 tablet (4 mg total) by mouth every 8 (eight) hours as needed for nausea or vomiting. 30 tablet 1  . ALPRAZolam (XANAX) 0.25 MG tablet Take 1 tablet (0.25 mg total) by mouth 2 (two) times daily as needed for anxiety or sleep. 60 tablet 3  . escitalopram (LEXAPRO) 20 MG tablet Take 1.5 tablets (30 mg total) by mouth daily. (Patient taking differently: Take 20 mg by mouth daily. Pt. States only take 1 tablet daily) 135 tablet 1   No facility-administered medications prior to visit.    Allergies  Allergen Reactions  . Erythromycin     REACTION: rash  . Penicillins     REACTION: swelling, rash  . Sulfonamide Derivatives     REACTION: GI upset    Review of Systems  Constitutional: Negative for fever and malaise/fatigue.  HENT: Negative for congestion.   Eyes: Negative for blurred vision.  Respiratory: Negative for shortness of breath.   Cardiovascular: Negative for chest pain, palpitations and leg swelling.  Gastrointestinal: Negative for abdominal pain, blood in stool and nausea.  Genitourinary: Negative for dysuria and frequency.  Musculoskeletal: Negative for falls.  Skin: Negative for rash.  Neurological: Negative for dizziness, loss of consciousness and headaches.  Endo/Heme/Allergies: Negative for environmental allergies.  Psychiatric/Behavioral: Negative for depression. The patient is not nervous/anxious.        Objective:    Physical Exam Constitutional:      General: She is not in acute distress.    Appearance: She is well-developed.  HENT:     Head: Normocephalic and atraumatic.  Eyes:     Conjunctiva/sclera: Conjunctivae normal.  Neck:     Thyroid: No thyromegaly.  Cardiovascular:     Rate and Rhythm: Normal rate and regular rhythm.     Heart sounds: Normal heart sounds. No murmur heard.   Pulmonary:     Effort: Pulmonary effort is normal. No respiratory distress.     Breath sounds: Normal breath sounds.  Abdominal:       General: Bowel sounds are normal. There is no distension.     Palpations: Abdomen is soft. There is no mass.     Tenderness: There is no abdominal tenderness.  Musculoskeletal:     Cervical back: Neck supple.  Lymphadenopathy:     Cervical: No cervical adenopathy.  Skin:    General: Skin is warm and dry.  Neurological:     Mental Status: She is alert and oriented to person, place, and time.  Psychiatric:  Behavior: Behavior normal.     BP 122/64 (BP Location: Left Arm, Patient Position: Sitting, Cuff Size: Small)   Pulse 63   Temp 97.9 F (36.6 C) (Oral)   Resp 14   Ht 5\' 7"  (1.702 m)   Wt 180 lb 9.6 oz (81.9 kg)   SpO2 97%   BMI 28.29 kg/m  Wt Readings from Last 3 Encounters:  09/05/20 180 lb 9.6 oz (81.9 kg)  07/18/20 185 lb (83.9 kg)  07/12/20 183 lb 11.2 oz (83.3 kg)    Diabetic Foot Exam - Simple   No data filed     Lab Results  Component Value Date   WBC 8.6 06/26/2020   HGB 13.2 06/26/2020   HCT 38.9 06/26/2020   PLT 257.0 06/26/2020   GLUCOSE 86 06/26/2020   CHOL 175 02/27/2020   TRIG 56.0 02/27/2020   HDL 88.70 02/27/2020   LDLCALC 75 02/27/2020   ALT 21 06/26/2020   AST 22 06/26/2020   NA 137 06/26/2020   K 4.4 06/26/2020   CL 103 06/26/2020   CREATININE 0.89 06/26/2020   BUN 16 06/26/2020   CO2 27 06/26/2020   TSH 1.00 02/27/2020   INR 1.0 12/05/2015   HGBA1C 5.4 02/27/2020    Lab Results  Component Value Date   TSH 1.00 02/27/2020   Lab Results  Component Value Date   WBC 8.6 06/26/2020   HGB 13.2 06/26/2020   HCT 38.9 06/26/2020   MCV 101.1 (H) 06/26/2020   PLT 257.0 06/26/2020   Lab Results  Component Value Date   NA 137 06/26/2020   K 4.4 06/26/2020   CO2 27 06/26/2020   GLUCOSE 86 06/26/2020   BUN 16 06/26/2020   CREATININE 0.89 06/26/2020   BILITOT 0.6 06/26/2020   ALKPHOS 79 06/26/2020   AST 22 06/26/2020   ALT 21 06/26/2020   PROT 7.4 06/26/2020   ALBUMIN 4.6 06/26/2020   CALCIUM 9.8 06/26/2020   GFR  67.40 06/26/2020   Lab Results  Component Value Date   CHOL 175 02/27/2020   Lab Results  Component Value Date   HDL 88.70 02/27/2020   Lab Results  Component Value Date   LDLCALC 75 02/27/2020   Lab Results  Component Value Date   TRIG 56.0 02/27/2020   Lab Results  Component Value Date   CHOLHDL 2 02/27/2020   Lab Results  Component Value Date   HGBA1C 5.4 02/27/2020       Assessment & Plan:   Problem List Items Addressed This Visit    Depression with anxiety    Under a great deal of stress with current work situation during the pandemic. Given a refill on the Escitalopram and given refill on alprazolam prn.       Relevant Medications   escitalopram (LEXAPRO) 20 MG tablet   ALPRAZolam (XANAX) 0.25 MG tablet   Vitamin D deficiency    Supplement and monitor      Cervical disc disorder with radiculopathy of cervical region    Encouraged moist heat and gentle stretching as tolerated. May try NSAIDs and prescription meds as directed and report if symptoms worsen or seek immediate care      Relevant Medications   escitalopram (LEXAPRO) 20 MG tablet   ALPRAZolam (XANAX) 0.25 MG tablet   Hyperglycemia    hgba1c acceptable, minimize simple carbs. Increase exercise as tolerated.       COVID-19    Was diagnosed mid August and had a negative test on 8/30. She  feels she has fully recovered.          I have changed Samantha Morales's escitalopram. I am also having her maintain her ibuprofen, omeprazole, ondansetron, and ALPRAZolam.  Meds ordered this encounter  Medications  . escitalopram (LEXAPRO) 20 MG tablet    Sig: Take 1 tablet (20 mg total) by mouth daily.    Dispense:  90 tablet    Refill:  1  . ALPRAZolam (XANAX) 0.25 MG tablet    Sig: Take 1 tablet (0.25 mg total) by mouth 2 (two) times daily as needed for anxiety or sleep.    Dispense:  60 tablet    Refill:  3     Penni Homans, MD

## 2020-09-09 NOTE — Assessment & Plan Note (Signed)
hgba1c acceptable, minimize simple carbs. Increase exercise as tolerated.  

## 2020-09-09 NOTE — Assessment & Plan Note (Signed)
Supplement and monitor 

## 2020-09-09 NOTE — Assessment & Plan Note (Signed)
Under a great deal of stress with current work situation during the pandemic. Given a refill on the Escitalopram and given refill on alprazolam prn.

## 2020-09-09 NOTE — Assessment & Plan Note (Signed)
Was diagnosed mid August and had a negative test on 8/30. She feels she has fully recovered.

## 2020-09-27 ENCOUNTER — Ambulatory Visit: Payer: BLUE CROSS/BLUE SHIELD | Admitting: Cardiology

## 2020-09-28 DIAGNOSIS — S51011A Laceration without foreign body of right elbow, initial encounter: Secondary | ICD-10-CM | POA: Diagnosis not present

## 2020-10-10 DIAGNOSIS — Z4802 Encounter for removal of sutures: Secondary | ICD-10-CM | POA: Diagnosis not present

## 2020-12-16 DIAGNOSIS — Z03818 Encounter for observation for suspected exposure to other biological agents ruled out: Secondary | ICD-10-CM | POA: Diagnosis not present

## 2020-12-16 DIAGNOSIS — Z20822 Contact with and (suspected) exposure to covid-19: Secondary | ICD-10-CM | POA: Diagnosis not present

## 2020-12-16 DIAGNOSIS — J302 Other seasonal allergic rhinitis: Secondary | ICD-10-CM | POA: Diagnosis not present

## 2020-12-16 DIAGNOSIS — J029 Acute pharyngitis, unspecified: Secondary | ICD-10-CM | POA: Diagnosis not present

## 2021-03-10 ENCOUNTER — Other Ambulatory Visit: Payer: Self-pay

## 2021-03-10 ENCOUNTER — Telehealth: Payer: Self-pay | Admitting: Medical

## 2021-03-10 ENCOUNTER — Ambulatory Visit (INDEPENDENT_AMBULATORY_CARE_PROVIDER_SITE_OTHER): Payer: BLUE CROSS/BLUE SHIELD | Admitting: Medical

## 2021-03-10 VITALS — BP 128/90 | HR 65 | Temp 98.2°F | Resp 18 | Ht 67.0 in | Wt 179.0 lb

## 2021-03-10 DIAGNOSIS — Z1211 Encounter for screening for malignant neoplasm of colon: Secondary | ICD-10-CM

## 2021-03-10 DIAGNOSIS — Z113 Encounter for screening for infections with a predominantly sexual mode of transmission: Secondary | ICD-10-CM | POA: Diagnosis not present

## 2021-03-10 DIAGNOSIS — Z0000 Encounter for general adult medical examination without abnormal findings: Secondary | ICD-10-CM | POA: Diagnosis not present

## 2021-03-10 LAB — CBC WITH DIFFERENTIAL/PLATELET
Basophils Absolute: 0.1 10*3/uL (ref 0.0–0.1)
Basophils Relative: 1.3 % (ref 0.0–3.0)
Eosinophils Absolute: 0.3 10*3/uL (ref 0.0–0.7)
Eosinophils Relative: 4.8 % (ref 0.0–5.0)
HCT: 40.8 % (ref 36.0–46.0)
Hemoglobin: 13.9 g/dL (ref 12.0–15.0)
Lymphocytes Relative: 26.1 % (ref 12.0–46.0)
Lymphs Abs: 1.7 10*3/uL (ref 0.7–4.0)
MCHC: 34 g/dL (ref 30.0–36.0)
MCV: 99.1 fl (ref 78.0–100.0)
Monocytes Absolute: 0.4 10*3/uL (ref 0.1–1.0)
Monocytes Relative: 6.7 % (ref 3.0–12.0)
Neutro Abs: 4.1 10*3/uL (ref 1.4–7.7)
Neutrophils Relative %: 61.1 % (ref 43.0–77.0)
Platelets: 250 10*3/uL (ref 150.0–400.0)
RBC: 4.12 Mil/uL (ref 3.87–5.11)
RDW: 14.2 % (ref 11.5–15.5)
WBC: 6.6 10*3/uL (ref 4.0–10.5)

## 2021-03-10 LAB — COMPREHENSIVE METABOLIC PANEL
ALT: 20 U/L (ref 0–35)
AST: 20 U/L (ref 0–37)
Albumin: 4.5 g/dL (ref 3.5–5.2)
Alkaline Phosphatase: 91 U/L (ref 39–117)
BUN: 12 mg/dL (ref 6–23)
CO2: 26 mEq/L (ref 19–32)
Calcium: 9.8 mg/dL (ref 8.4–10.5)
Chloride: 102 mEq/L (ref 96–112)
Creatinine, Ser: 0.65 mg/dL (ref 0.40–1.20)
GFR: 103.15 mL/min (ref >60.00)
Glucose, Bld: 82 mg/dL (ref 70–99)
Potassium: 4.8 mEq/L (ref 3.5–5.1)
Sodium: 135 mEq/L (ref 135–145)
Total Bilirubin: 0.6 mg/dL (ref 0.2–1.2)
Total Protein: 7.6 g/dL (ref 6.0–8.3)

## 2021-03-10 LAB — LIPID PANEL
Cholesterol: 183 mg/dL (ref 0–200)
HDL: 100.3 mg/dL (ref >39.00)
LDL Cholesterol: 67 mg/dL (ref 0–99)
NonHDL: 82.98
Total CHOL/HDL Ratio: 2
Triglycerides: 79 mg/dL (ref 0.0–149.0)
VLDL: 15.8 mg/dL (ref 0.0–40.0)

## 2021-03-10 NOTE — Progress Notes (Signed)
Subjective:    Patient ID: Samantha Morales, female    DOB: 1971-11-11, 50 y.o.   MRN: [id number]  HPI.   Pt for cpe/wellness exam.  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 Pt has been exercising 3 days a week. Joined gym. Pt is eating healthier. Hardly any caffeine. Pt drinks alcohol and weekends.(3-4 on weekends. Will smoke when drinks)  Last mammogram May 2021 was normal. [facility]  Up to date on papsmear. Due on 02/19/2022 per epic review.    Review of Systems  Constitutional: Negative for chills, fatigue and fever.  HENT: Negative for congestion and drooling.   Respiratory: Negative for cough, chest tightness, shortness of breath and wheezing.   Cardiovascular: Negative for chest pain and palpitations.  Gastrointestinal: Negative for abdominal pain, constipation, nausea and vomiting.  Genitourinary: Negative for dysuria.  Musculoskeletal: Negative for back pain.  Skin: Negative for rash.  Neurological: Negative for dizziness, speech difficulty, weakness, light-headedness and numbness.  Hematological: Negative for adenopathy. Does not bruise/bleed easily.  Psychiatric/Behavioral: Negative for behavioral problems, confusion, dysphoric mood, sleep disturbance and suicidal ideas. The patient is not nervous/anxious.     Past Medical History:  Diagnosis Date  . Acute upper respiratory infection 10/26/2016  . Anemia 03/13/2015  . ANEMIA, HX OF 01/20/2011  . Atypical chest pain 04/15/2012  . Back pain 07/22/2012  . Cellulitis of ankle 05/12/2011  . CHICKENPOX, HX OF 01/20/2011  . DEPRESSION 01/20/2011  . Elevated BP 04/15/2012  . Epistaxis 10/26/2016  . GERD 01/20/2011  . Hyperglycemia 05/10/2016  . Insomnia 12/12/2012  . LACERATION OF FINGER 01/27/2011  . Overweight 01/19/2016  . Preventative health care 02/28/2014  . RESTLESS LEG SYNDROME, HX OF 01/20/2011  . Sacroiliac joint pain 07/22/2012  . Sinusitis, acute  12/12/2012  . Tobacco abuse 05/12/2011  . 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 Marital status: Married    Spouse name: Not on file  . Number of children: Not on file  . Years of education: Not on file  . Highest education level: Not on file  Occupational History  . Not on file  Tobacco Use  . Smoking status: Current Every Day Smoker    Packs/day: 0.50    Last attempt to quit: 04/12/2012    Years since quitting: 8.9  . Smokeless tobacco: Never Used  Substance and Sexual Activity  . Alcohol use: Yes    Alcohol/week: 0.0 standard drinks    Comment: socially  . Drug use: No  . Sexual activity: Yes    Partners: Male    Comment: no dietary, lives with husband, wears seatbelt  Other Topics Concern  . Not on file  Social History Narrative  . Not on file   Social Determinants of Health   Financial Resource Strain: Not on file  Food Insecurity: Not on file  Transportation Needs: Not on file  Physical Activity: Not on file  Stress: Not on file  Social Connections: Not on file  Intimate Partner Violence: Not on file    Past Surgical History:  Procedure Laterality Date  . lasik eye surgery X b/l      Family History  Problem Relation Age of Onset  . Hypertension Mother   . Arrhythmia Mother   . Lupus Sister   . Hypertension Brother   . Epilepsy Brother   . Cancer Maternal Grandmother  breast  . Alzheimer's disease Maternal Grandmother   . Aneurysm Maternal Grandfather        brain  . Arthritis Maternal Grandfather        rheumatoid  . Nephrolithiasis Paternal Grandmother   . Heart attack Paternal Grandfather   . Anemia Brother   . Neuromuscular disorder Neg Hx     Allergies  Allergen Reactions  . Erythromycin     REACTION: rash  . Penicillins     REACTION: swelling, rash  . Sulfonamide Derivatives     REACTION: GI upset    Current Outpatient Medications on File Prior to Visit  Medication Sig Dispense  Refill  . ALPRAZolam (XANAX) 0.25 MG tablet Take 1 tablet (0.25 mg total) by mouth 2 (two) times daily as needed for anxiety or sleep. 60 tablet 3  . escitalopram (LEXAPRO) 20 MG tablet Take 1 tablet (20 mg total) by mouth daily. 90 tablet 1  . ibuprofen (ADVIL,MOTRIN) 200 MG tablet Take 200 mg by mouth every 6 (six) hours as needed.    Marland Kitchen omeprazole (PRILOSEC) 20 MG capsule 90TAKE 1 CAPSULE BY MOUTH EVERY DAY 90 capsule 1  . ondansetron (ZOFRAN) 4 MG tablet Take 1 tablet (4 mg total) by mouth every 8 (eight) hours as needed for nausea or vomiting. 30 tablet 1   No current facility-administered medications on file prior to visit.    BP 128/90   Pulse 65   Temp 98.2 F (36.8 C)   Resp 18   Ht 5\' 7"  (1.702 m)   Wt 179 lb (81.2 kg)   SpO2 99%   BMI 28.04 kg/m       Objective:   Physical Exam  General Mental Status- Alert. General Appearance- Not in acute distress.   Skin General: Color- Normal Color. Moisture- Normal Moisture. No worrisome moles or lesions.  Neck Carotid Arteries- Normal color. Moisture- Normal Moisture. No carotid bruits. No JVD.  Chest and Lung Exam Auscultation: Breath Sounds:-Normal.  Cardiovascular Auscultation:Rythm- Regular. Murmurs & Other Heart Sounds:Auscultation of the heart reveals- No Murmurs.  Abdomen Inspection:-Inspeection Normal. Palpation/Percussion:Note:No mass. Palpation and Percussion of the abdomen reveal- Non Tender, Non Distended + BS, no rebound or guarding.   Neurologic Cranial Nerve exam:- CN III-XII intact(No nystagmus), symmetric smile. Strength:- 5/5 equal and symmetric strength both upper and lower extremities.      Assessment & Plan:  For you wellness exam today I have ordered cbc, cmp and lipid panel.   Flu and covid vaccine declined.   Recommend exercise and healthy diet.  We will let you know lab results as they come in.  Follow up date appointment will be determined after lab review.   Recommend stop  smoking completely.   Mackie Pai, PA-C

## 2021-03-10 NOTE — Patient Instructions (Signed)
For you wellness exam today I have ordered cbc, cmp and lipid panel.   Flu and covid vaccine declined.   Recommend exercise and healthy diet.  We will let you know lab results as they come in.  Follow up date appointment will be determined after lab review.   Recommend stop smoking completely.    Preventive Care 82-50 Years Old, Female Preventive care refers to lifestyle choices and visits with your health care provider that can promote health and wellness. This includes:  A yearly physical exam. This is also called an annual wellness visit.  Regular dental and eye exams.  Immunizations.  Screening for certain conditions.  Healthy lifestyle choices, such as: ? Eating a healthy diet. ? Getting regular exercise. ? Not using drugs or products that contain nicotine and tobacco. ? Limiting alcohol use. What can I expect for my preventive care visit? Physical exam Your health care provider will check your:  Height and weight. These may be used to calculate your BMI (body mass index). BMI is a measurement that tells if you are at a healthy weight.  Heart rate and blood pressure.  Body temperature.  Skin for abnormal spots. Counseling Your health care provider may ask you questions about your:  Past medical problems.  Family's medical history.  Alcohol, tobacco, and drug use.  Emotional well-being.  Home life and relationship well-being.  Sexual activity.  Diet, exercise, and sleep habits.  Work and work Statistician.  Access to firearms.  Method of birth control.  Menstrual cycle.  Pregnancy history. What immunizations do I need? Vaccines are usually given at various ages, according to a schedule. Your health care provider will recommend vaccines for you based on your age, medical history, and lifestyle or other factors, such as travel or where you work.   What tests do I need? Blood tests  Lipid and cholesterol levels. These may be checked every 5  years, or more often if you are over 61 years old.  Hepatitis C test.  Hepatitis B test. Screening  Lung cancer screening. You may have this screening every year starting at age 51 if you have a 30-pack-year history of smoking and currently smoke or have quit within the past 15 years.  Colorectal cancer screening. ? All adults should have this screening starting at age 1 and continuing until age 37. ? Your health care provider may recommend screening at age 5 if you are at increased risk. ? You will have tests every 1-10 years, depending on your results and the type of screening test.  Diabetes screening. ? This is done by checking your blood sugar (glucose) after you have not eaten for a while (fasting). ? You may have this done every 1-3 years.  Mammogram. ? This may be done every 1-2 years. ? Talk with your health care provider about when you should start having regular mammograms. This may depend on whether you have a family history of breast cancer.  BRCA-related cancer screening. This may be done if you have a family history of breast, ovarian, tubal, or peritoneal cancers.  Pelvic exam and Pap test. ? This may be done every 3 years starting at age 16. ? Starting at age 35, this may be done every 5 years if you have a Pap test in combination with an HPV test. Other tests  STD (sexually transmitted disease) testing, if you are at risk.  Bone density scan. This is done to screen for osteoporosis. You may have this scan if  you are at high risk for osteoporosis. Talk with your health care provider about your test results, treatment options, and if necessary, the need for more tests. Follow these instructions at home: Eating and drinking  Eat a diet that includes fresh fruits and vegetables, whole grains, lean protein, and low-fat dairy products.  Take vitamin and mineral supplements as recommended by your health care provider.  Do not drink alcohol if: ? Your health care  provider tells you not to drink. ? You are pregnant, may be pregnant, or are planning to become pregnant.  If you drink alcohol: ? Limit how much you have to 0-1 drink a day. ? Be aware of how much alcohol is in your drink. In the [place] one drink equals one 12 oz bottle of beer (355 mL), one 5 oz glass of wine (148 mL), or one 1 oz glass of hard liquor (44 mL).   Lifestyle  Take daily care of your teeth and gums. Brush your teeth every morning and night with fluoride toothpaste. Floss one time each day.  Stay active. Exercise for at least 30 minutes 5 or more days each week.  Do not use any products that contain nicotine or tobacco, such as cigarettes, e-cigarettes, and chewing tobacco. If you need help quitting, ask your health care provider.  Do not use drugs.  If you are sexually active, practice safe sex. Use a condom or other form of protection to prevent STIs (sexually transmitted infections).  If you do not wish to become pregnant, use a form of birth control. If you plan to become pregnant, see your health care provider for a prepregnancy visit.  If told by your health care provider, take low-dose aspirin daily starting at age 10.  Find healthy ways to cope with stress, such as: ? Meditation, yoga, or listening to music. ? Journaling. ? Talking to a trusted person. ? Spending time with friends and family. Safety  Always wear your seat belt while driving or riding in a vehicle.  Do not drive: ? If you have been drinking alcohol. Do not ride with someone who has been drinking. ? When you are tired or distracted. ? While texting.  Wear a helmet and other protective equipment during sports activities.  If you have firearms in your house, make sure you follow all gun safety procedures. What's next?  Visit your health care provider once a year for an annual wellness visit.  Ask your health care provider how often you should have your eyes and teeth checked.  Stay up to  date on all vaccines. This information is not intended to replace advice given to you by your health care provider. Make sure you discuss any questions you have with your health care provider. Document Revised: 09/17/2020 Document Reviewed: 08/25/2018 Elsevier Patient Education  2021 [employer].

## 2021-03-10 NOTE — Telephone Encounter (Signed)
opened to review. 

## 2021-03-11 LAB — HIV ANTIBODY (ROUTINE TESTING W REFLEX): HIV 1&2 Ab, 4th Generation: NONREACTIVE

## 2021-03-17 ENCOUNTER — Encounter: Payer: Self-pay | Admitting: Gastroenterology

## 2021-05-13 ENCOUNTER — Other Ambulatory Visit: Payer: Self-pay

## 2021-05-13 ENCOUNTER — Ambulatory Visit (AMBULATORY_SURGERY_CENTER): Payer: Self-pay | Admitting: *Deleted

## 2021-05-13 VITALS — Ht 67.0 in | Wt 182.0 lb

## 2021-05-13 DIAGNOSIS — Z1211 Encounter for screening for malignant neoplasm of colon: Secondary | ICD-10-CM

## 2021-05-13 MED ORDER — NA SULFATE-K SULFATE-MG SULF 17.5-3.13-1.6 GM/177ML PO SOLN: 1.0000 | ORAL | 0 refills | Status: DC

## 2021-05-13 NOTE — Progress Notes (Signed)
Patient is here in-person for PV. Patient denies any allergies to eggs or soy. Patient denies any problems with anesthesia/sedation. Patient denies any oxygen use at home. Patient denies taking any diet/weight loss medications or blood thinners. Patient is not being treated for MRSA or C-diff. Patient is aware of our care-partner policy and JGOTL-57 safety protocol. EMMI education assigned to the patient for the procedure, sent to [place].   Per Patient she said, "I am hard to go down".

## 2021-05-14 DIAGNOSIS — J069 Acute upper respiratory infection, unspecified: Secondary | ICD-10-CM | POA: Diagnosis not present

## 2021-05-20 ENCOUNTER — Encounter: Payer: Self-pay | Admitting: Gastroenterology

## 2021-05-20 DIAGNOSIS — Z01419 Encounter for gynecological examination (general) (routine) without abnormal findings: Secondary | ICD-10-CM | POA: Diagnosis not present

## 2021-05-20 DIAGNOSIS — Z6828 Body mass index (BMI) 28.0-28.9, adult: Secondary | ICD-10-CM | POA: Diagnosis not present

## 2021-05-20 DIAGNOSIS — Z1231 Encounter for screening mammogram for malignant neoplasm of breast: Secondary | ICD-10-CM | POA: Diagnosis not present

## 2021-05-20 LAB — HM PAP SMEAR: HPV, high-risk: NOT DETECTED

## 2021-05-20 LAB — RESULTS CONSOLE HPV: CHL HPV: NEGATIVE

## 2021-05-27 ENCOUNTER — Other Ambulatory Visit: Payer: Self-pay

## 2021-05-27 ENCOUNTER — Encounter: Payer: Self-pay | Admitting: Gastroenterology

## 2021-05-27 ENCOUNTER — Ambulatory Visit (AMBULATORY_SURGERY_CENTER): Payer: BC Managed Care – PPO | Admitting: Gastroenterology

## 2021-05-27 VITALS — BP 121/79 | HR 61 | Temp 97.3°F | Resp 11 | Ht 67.0 in | Wt 182.0 lb

## 2021-05-27 DIAGNOSIS — D125 Benign neoplasm of sigmoid colon: Secondary | ICD-10-CM

## 2021-05-27 DIAGNOSIS — D124 Benign neoplasm of descending colon: Secondary | ICD-10-CM

## 2021-05-27 DIAGNOSIS — K635 Polyp of colon: Secondary | ICD-10-CM

## 2021-05-27 DIAGNOSIS — Z1211 Encounter for screening for malignant neoplasm of colon: Secondary | ICD-10-CM | POA: Diagnosis not present

## 2021-05-27 DIAGNOSIS — K640 First degree hemorrhoids: Secondary | ICD-10-CM

## 2021-05-27 DIAGNOSIS — K5730 Diverticulosis of large intestine without perforation or abscess without bleeding: Secondary | ICD-10-CM

## 2021-05-27 MED ORDER — SODIUM CHLORIDE 0.9 % IV SOLN: 500.0000 mL | Freq: Once | INTRAVENOUS | Status: DC

## 2021-05-27 NOTE — Op Note (Signed)
[place] Patient Name: Samantha Morales Procedure Date: 05/27/2021 11:12 AM MRN: [id number] Endoscopist: Gerrit Heck , MD Age: 50 Referring MD:  Date of Birth: 1971-08-08 Gender: Female Account #: [id number] Procedure:                Colonoscopy Indications:              Screening for colorectal malignant neoplasm                           Has had colonoscopy x2 in the past, with last being                            in her early 30's for symptomatic hemorrhoids, now                            s/p hemorrhoid banding at that time. Medicines:                Monitored Anesthesia Care Procedure:                Pre-Anesthesia Assessment:                           - Prior to the procedure, a History and Physical                            was performed, and patient medications and                            allergies were reviewed. The patient's tolerance of                            previous anesthesia was also reviewed. The risks                            and benefits of the procedure and the sedation                            options and risks were discussed with the patient.                            All questions were answered, and informed consent                            was obtained. Prior Anticoagulants: The patient has                            taken no previous anticoagulant or antiplatelet                            agents. ASA Grade Assessment: II - A patient with                            mild systemic disease. After reviewing the risks  and benefits, the patient was deemed in                            satisfactory condition to undergo the procedure.                           After obtaining informed consent, the colonoscope                            was passed under direct vision. Throughout the                            procedure, the patient's blood pressure, pulse, and                            oxygen saturations were  monitored continuously. The                            Olympus CF-HQ190 (#[id number]) Colonoscope was                            introduced through the anus and advanced to the the                            terminal ileum. The colonoscopy was performed                            without difficulty. The patient tolerated the                            procedure well. The quality of the bowel                            preparation was good. The terminal ileum, ileocecal                            valve, appendiceal orifice, and rectum were                            photographed. Scope In: 11:25:05 AM Scope Out: 11:47:37 AM Scope Withdrawal Time: 0 hours 17 minutes 59 seconds  Total Procedure Duration: 0 hours 22 minutes 32 seconds  Findings:                 Skin tags were found on perianal exam.                           A 6 mm polyp was found in the descending colon. The                            polyp was sessile with adherent mucus cap. The                            polyp was removed with a cold snare. Resection and  retrieval were complete. Estimated blood loss was                            minimal.                           A 3 mm polyp was found in the sigmoid colon. The                            polyp was sessile. The polyp was removed with a                            cold snare. Resection and retrieval were complete.                            Estimated blood loss was minimal.                           A few small-mouthed diverticula were found in the                            sigmoid colon.                           Non-bleeding internal hemorrhoids were found during                            retroflexion. The hemorrhoids were small.                           The terminal ileum appeared normal. Complications:            No immediate complications. Estimated Blood Loss:     Estimated blood loss was minimal. Impression:               - Perianal skin  tags found on perianal exam.                           - One 6 mm polyp in the descending colon, removed                            with a cold snare. Resected and retrieved.                           - One 3 mm polyp in the sigmoid colon, removed with                            a cold snare. Resected and retrieved.                           - Diverticulosis in the sigmoid colon.                           - Non-bleeding internal hemorrhoids.                           -  The examined portion of the ileum was normal. Recommendation:           - Patient has a contact number available for                            emergencies. The signs and symptoms of potential                            delayed complications were discussed with the                            patient. Return to normal activities tomorrow.                            Written discharge instructions were provided to the                            patient.                           - Resume previous diet.                           - Continue present medications.                           - Await pathology results.                           - Repeat colonoscopy for surveillance based on                            pathology results.                           - Return to GI office PRN.                           - Use fiber, for example Citrucel, Fibercon, Konsyl                            or Metamucil.                           - Internal hemorrhoids were noted on this study and                            may be amenable to hemorrhoid band ligation. If you                            are interested in further treatment of these                            hemorrhoids with band ligation, please contact my  clinic to set up an appointment for evaluation and                            treatment. Gerrit Heck, MD 05/27/2021 11:53:26 AM

## 2021-05-27 NOTE — Progress Notes (Signed)
Vital signs checked by: CW  The patient states no changes in medical or surgical history since pre-visit screening on 05/13/21.

## 2021-05-27 NOTE — Patient Instructions (Signed)
Discharge instructions given. Handouts on polyps,diverticulosis and hemorrhoids. Resume previous medications. YOU HAD AN ENDOSCOPIC PROCEDURE TODAY AT [facility]:   Refer to the procedure report that was given to you for any specific questions about what was found during the examination.  If the procedure report does not answer your questions, please call your gastroenterologist to clarify.  If you requested that your care partner not be given the details of your procedure findings, then the procedure report has been included in a sealed envelope for you to review at your convenience later.  YOU SHOULD EXPECT: Some feelings of bloating in the abdomen. Passage of more gas than usual.  Walking can help get rid of the air that was put into your GI tract during the procedure and reduce the bloating. If you had a lower endoscopy (such as a colonoscopy or flexible sigmoidoscopy) you may notice spotting of blood in your stool or on the toilet paper. If you underwent a bowel prep for your procedure, you may not have a normal bowel movement for a few days.  Please Note:  You might notice some irritation and congestion in your nose or some drainage.  This is from the oxygen used during your procedure.  There is no need for concern and it should clear up in a day or so.  SYMPTOMS TO REPORT IMMEDIATELY:   Following lower endoscopy (colonoscopy or flexible sigmoidoscopy):  Excessive amounts of blood in the stool  Significant tenderness or worsening of abdominal pains  Swelling of the abdomen that is new, acute  Fever of 100F or higher   For urgent or emergent issues, a gastroenterologist can be reached at any hour by calling [phone]. Do not use MyChart messaging for urgent concerns.    DIET:  We do recommend a small meal at first, but then you may proceed to your regular diet.  Drink plenty of fluids but you should avoid alcoholic beverages for 24 hours.  ACTIVITY:  You should  plan to take it easy for the rest of today and you should NOT DRIVE or use heavy machinery until tomorrow (because of the sedation medicines used during the test).    FOLLOW UP: Our staff will call the number listed on your records 48-72 hours following your procedure to check on you and address any questions or concerns that you may have regarding the information given to you following your procedure. If we do not reach you, we will leave a message.  We will attempt to reach you two times.  During this call, we will ask if you have developed any symptoms of COVID 19. If you develop any symptoms (ie: fever, flu-like symptoms, shortness of breath, cough etc.) before then, please call [phone].  If you test positive for Covid 19 in the 2 weeks post procedure, please call and report this information to [place].    If any biopsies were taken you will be contacted by phone or by letter within the next 1-3 weeks.  Please call us at [phone] if you have not heard about the biopsies in 3 weeks.    SIGNATURES/CONFIDENTIALITY: You and/or your care partner have signed paperwork which will be entered into your electronic medical record.  These signatures attest to the fact that that the information above on your After Visit Summary has been reviewed and is understood.  Full responsibility of the confidentiality of this discharge information lies with you and/or your care-partner.

## 2021-05-27 NOTE — Progress Notes (Signed)
Called to room to assist during endoscopic procedure.  Patient ID and intended procedure confirmed with present staff. Received instructions for my participation in the procedure from the performing physician.  

## 2021-05-27 NOTE — Progress Notes (Signed)
pt tolerated well. VSS. awake and to recovery. Report given to RN.  

## 2021-05-29 ENCOUNTER — Telehealth: Payer: Self-pay | Admitting: *Deleted

## 2021-05-29 ENCOUNTER — Telehealth: Payer: Self-pay

## 2021-05-29 NOTE — Telephone Encounter (Signed)
Attempted to reach patient for post-procedure f/u call. No answer. Left message staff will make another attempt to reach her later today and for her to please not hesitate to call us if she has any questions/concerns regarding her care.

## 2021-05-29 NOTE — Telephone Encounter (Signed)
  Follow up Call-  Call back number 05/27/2021  Post procedure Call Back phone  # [phone]  Permission to leave phone message Yes  Some recent data might be hidden     Patient questions:  Do you have a fever, pain , or abdominal swelling? No. Pain Score  0 *  Have you tolerated food without any problems? Yes.    Have you been able to return to your normal activities? Yes.    Do you have any questions about your discharge instructions: Diet   No. Medications  No. Follow up visit  Yes.    Do you have questions or concerns about your Care? No.  Actions: * If pain score is 4 or above: No action needed, pain <4.  1. Have you developed a fever since your procedure? no  2.   Have you had an respiratory symptoms (SOB or cough) since your procedure? no  3.   Have you tested positive for COVID 19 since your procedure no  4.   Have you had any family members/close contacts diagnosed with the COVID 19 since your procedure? no   If yes to any of these questions please route to Joylene John, RN and Joella Prince, RN

## 2021-06-05 DIAGNOSIS — M25561 Pain in right knee: Secondary | ICD-10-CM | POA: Diagnosis not present

## 2021-06-20 ENCOUNTER — Other Ambulatory Visit: Payer: Self-pay | Admitting: Family Medicine

## 2021-07-01 ENCOUNTER — Telehealth: Payer: Self-pay

## 2021-07-01 NOTE — Telephone Encounter (Signed)
Called patient and [facility] about pathology results from procedure on 05/27/2021.  Left office number to return call for any questions or concerns.

## 2021-08-07 DIAGNOSIS — S61221A Laceration with foreign body of left index finger without damage to nail, initial encounter: Secondary | ICD-10-CM | POA: Diagnosis not present

## 2021-08-14 DIAGNOSIS — Z4802 Encounter for removal of sutures: Secondary | ICD-10-CM | POA: Diagnosis not present

## 2021-08-23 DIAGNOSIS — J069 Acute upper respiratory infection, unspecified: Secondary | ICD-10-CM | POA: Diagnosis not present

## 2021-08-23 DIAGNOSIS — Z20822 Contact with and (suspected) exposure to covid-19: Secondary | ICD-10-CM | POA: Diagnosis not present

## 2021-09-16 ENCOUNTER — Ambulatory Visit: Payer: BLUE CROSS/BLUE SHIELD | Admitting: Family Medicine

## 2021-09-18 DIAGNOSIS — B958 Unspecified staphylococcus as the cause of diseases classified elsewhere: Secondary | ICD-10-CM | POA: Diagnosis not present

## 2021-10-16 DIAGNOSIS — M25561 Pain in right knee: Secondary | ICD-10-CM | POA: Diagnosis not present

## 2021-10-21 DIAGNOSIS — M25561 Pain in right knee: Secondary | ICD-10-CM | POA: Diagnosis not present

## 2021-11-03 ENCOUNTER — Ambulatory Visit: Payer: BC Managed Care – PPO | Admitting: Medical

## 2021-11-03 ENCOUNTER — Other Ambulatory Visit: Payer: Self-pay

## 2021-11-03 VITALS — BP 133/80 | HR 61 | Temp 98.6°F | Resp 18 | Ht 67.0 in | Wt 185.0 lb

## 2021-11-03 DIAGNOSIS — K219 Gastro-esophageal reflux disease without esophagitis: Secondary | ICD-10-CM

## 2021-11-03 DIAGNOSIS — F419 Anxiety disorder, unspecified: Secondary | ICD-10-CM

## 2021-11-03 DIAGNOSIS — H539 Unspecified visual disturbance: Secondary | ICD-10-CM

## 2021-11-03 DIAGNOSIS — G4700 Insomnia, unspecified: Secondary | ICD-10-CM | POA: Diagnosis not present

## 2021-11-03 DIAGNOSIS — R739 Hyperglycemia, unspecified: Secondary | ICD-10-CM

## 2021-11-03 DIAGNOSIS — E559 Vitamin D deficiency, unspecified: Secondary | ICD-10-CM | POA: Diagnosis not present

## 2021-11-03 MED ORDER — DOXYCYCLINE HYCLATE 100 MG PO TABS: 100.0000 mg | ORAL_TABLET | Freq: Two times a day (BID) | ORAL | 0 refills | Status: DC

## 2021-11-03 NOTE — Progress Notes (Signed)
Subjective:    Patient ID: Samantha Morales, female    DOB: 21-Sep-1971, 50 y.o.   MRN: [id number]  HPI Pt in for follow up.  Pt has gerd. She states controlled with omeprazole.  Anxiety and insomnia. She uses xanax occasionally. She states has plenty of tabs since uses only once a week.   Takes lexapro for anxiety as well.   Pt taking plant based med over the counter and she states helping with menopause symptoms. Hot flashes resolved per pt.  Pt went to eye doctor over past year and they told her eye vision decreased in rt eye. So much so that eye MD recommend she get checked for diabetes.   Pt has rt lower chin 3 areas of redness and recent area of redness under her rt nostril.   Pt states has tried mupirocin, witch hazel and peroxide to this area for 3 months.   Pt went to UC about 1.5 months ago.  She  was given 7 days of clindamycine 50 mg qid for 7 days. Started to clear up but never got better completely. At time went to urgent carea area was red and tender.  Pt has seen dermatologist in the past. States was Staph.      Review of Systems  Constitutional:  Negative for chills, fatigue and fever.  HENT:  Negative for congestion.   Respiratory:  Negative for cough, chest tightness and shortness of breath.   Cardiovascular:  Negative for chest pain and palpitations.  Gastrointestinal:  Positive for abdominal pain.  Musculoskeletal:  Negative for back pain.  Skin:        See hpi.  Neurological:  Negative for dizziness, numbness and headaches.  Psychiatric/Behavioral:  Negative for behavioral problems, decreased concentration and sleep disturbance. The patient is nervous/anxious.     Past Medical History:  Diagnosis Date   Acute upper respiratory infection 10/26/2016   Allergy    Anemia 03/13/2015   ANEMIA, HX OF 01/20/2011   Anxiety    Atypical chest pain 04/15/2012   Back pain 07/22/2012   Cellulitis of ankle 05/12/2011   CHICKENPOX, HX OF 01/20/2011   Elevated BP  04/15/2012   Epistaxis 10/26/2016   GERD 01/20/2011   Hyperglycemia 05/10/2016   Insomnia 12/12/2012   LACERATION OF FINGER 01/27/2011   Overweight 01/19/2016   Preventative health care 02/28/2014   Sacroiliac joint pain 07/22/2012   Sinusitis, acute 12/12/2012   Tobacco abuse 05/12/2011   Tobacco abuse, in remission 05/12/2011     Social History   Socioeconomic History   Marital status: Married    Spouse name: Not on file   Number of children: Not on file   Years of education: Not on file   Highest education level: Not on file  Occupational History   Not on file  Tobacco Use   Smoking status: Some Days    Packs/day: 0.50    Types: Cigarettes    Last attempt to quit: 04/12/2012    Years since quitting: 9.5   Smokeless tobacco: Never   Tobacco comments:    smokes when social  Vaping Use   Vaping Use: Never used  Substance and Sexual Activity   Alcohol use: Yes    Alcohol/week: 0.0 standard drinks    Comment: socially   Drug use: No   Sexual activity: Yes    Partners: Male    Comment: no dietary, lives with husband, wears seatbelt  Other Topics Concern   Not on file  Social History  Narrative   Not on file   Social Determinants of Health   Financial Resource Strain: Not on file  Food Insecurity: Not on file  Transportation Needs: Not on file  Physical Activity: Not on file  Stress: Not on file  Social Connections: Not on file  Intimate Partner Violence: Not on file    Past Surgical History:  Procedure Laterality Date   COLONOSCOPY     x2 in 20's & 30's - for hemorrhoids   lasik eye surgery X b/l     WISDOM TOOTH EXTRACTION     x4    Family History  Problem Relation Age of Onset   Hypertension Mother    Arrhythmia Mother    Lupus Sister    Hypertension Brother    Epilepsy Brother    Cancer Maternal Grandmother        breast   Alzheimer's disease Maternal Grandmother    Aneurysm Maternal Grandfather        brain   Arthritis Maternal Grandfather         rheumatoid   Nephrolithiasis Paternal Grandmother    Heart attack Paternal Grandfather    Anemia Brother    Neuromuscular disorder Neg Hx    Colon cancer Neg Hx    Esophageal cancer Neg Hx    Rectal cancer Neg Hx    Stomach cancer Neg Hx     Allergies  Allergen Reactions   Erythromycin     REACTION: rash   Penicillins     REACTION: swelling, rash   Sulfonamide Derivatives     REACTION: GI upset    Current Outpatient Medications on File Prior to Visit  Medication Sig Dispense Refill   acetaminophen (TYLENOL) 325 MG tablet Take 650 mg by mouth every 6 (six) hours as needed.     ALPRAZolam (XANAX) 0.25 MG tablet Take 1 tablet (0.25 mg total) by mouth 2 (two) times daily as needed for anxiety or sleep. 60 tablet 3   Ascorbic Acid (VITAMIN C) 1000 MG tablet Take 1,000 mg by mouth daily.     Cholecalciferol (VITAMIN D3 PO) Take by mouth.     Cyanocobalamin (VITAMIN B-12 PO) Take by mouth.     escitalopram (LEXAPRO) 20 MG tablet TAKE 1 TABLET BY MOUTH EVERY DAY 90 tablet 1   omeprazole (PRILOSEC) 20 MG capsule 90TAKE 1 CAPSULE BY MOUTH EVERY DAY 90 capsule 1   zinc gluconate 50 MG tablet Take 50 mg by mouth daily.     estradiol (ESTRACE) 0.1 MG/GM vaginal cream estradiol 0.01% (0.1 mg/gram) vaginal cream  Insert 1 applicatorful twice a week by vaginal route. (Patient not taking: Reported on 05/27/2021)     No current facility-administered medications on file prior to visit.    BP 133/80   Pulse 61   Temp 98.6 F (37 C)   Resp 18   Ht 5\' 7"  (1.702 m)   Wt 185 lb (83.9 kg)   SpO2 97%   BMI 28.98 kg/m       Objective:   Physical Exam  General- No acute distress. Pleasant patient. Neck- Full range of motion, no jvd Lungs- Clear, even and unlabored. Heart- regular rate and rhythm. Neurologic- CNII- XII grossly intact.  Skin- rt side of chin below corner of lip 3 area of pinkish redness. Mild tender to touch. No fluctuance and no induration.      Assessment & Plan:    Patient Instructions  For elevated sugar with rt eye vision changes per Eye MD  will get repeat a1c at specialist advise.  For vit D deficiency will get Vit D level.  For anxiety overall controlled continue lexapro and xanax. Xanax helping with your insomnia as well.   Gerd controlled with omeprazole.  For skin infection on face rx doxycycline antibiotic. Expect will improve. If not let me know.   Follow up 7-10 days or sooner if needed/

## 2021-11-03 NOTE — Patient Instructions (Addendum)
For elevated sugar with rt eye vision changes per Eye MD will get repeat a1c at specialist advise.  For vit D deficiency will get Vit D level.  For anxiety overall controlled continue lexapro and xanax. Xanax helping with your insomnia as well.   Gerd controlled with omeprazole.  For skin infection on face rx doxycycline antibiotic. Expect will improve. If not let me know.   Follow up 7-10 days or sooner if needed/

## 2021-11-04 LAB — COMPREHENSIVE METABOLIC PANEL
ALT: 17 U/L (ref 0–35)
AST: 19 U/L (ref 0–37)
Albumin: 4.5 g/dL (ref 3.5–5.2)
Alkaline Phosphatase: 81 U/L (ref 39–117)
BUN: 13 mg/dL (ref 6–23)
CO2: 28 mEq/L (ref 19–32)
Calcium: 9.4 mg/dL (ref 8.4–10.5)
Chloride: 100 mEq/L (ref 96–112)
Creatinine, Ser: 0.76 mg/dL (ref 0.40–1.20)
GFR: 91.39 mL/min (ref >60.00)
Glucose, Bld: 120 mg/dL — ABNORMAL HIGH (ref 70–99)
Potassium: 4 mEq/L (ref 3.5–5.1)
Sodium: 136 mEq/L (ref 135–145)
Total Bilirubin: 0.5 mg/dL (ref 0.2–1.2)
Total Protein: 7.3 g/dL (ref 6.0–8.3)

## 2021-11-04 LAB — VITAMIN D 25 HYDROXY (VIT D DEFICIENCY, FRACTURES): VITD: 34.01 ng/mL (ref 30.00–100.00)

## 2021-11-04 LAB — HEMOGLOBIN A1C: Hgb A1c MFr Bld: 5.6 % (ref 4.6–6.5)

## 2021-11-05 DIAGNOSIS — M7121 Synovial cyst of popliteal space [Baker], right knee: Secondary | ICD-10-CM | POA: Diagnosis not present

## 2021-12-13 ENCOUNTER — Other Ambulatory Visit: Payer: Self-pay | Admitting: Family Medicine

## 2021-12-24 DIAGNOSIS — M7121 Synovial cyst of popliteal space [Baker], right knee: Secondary | ICD-10-CM | POA: Diagnosis not present

## 2022-01-07 IMAGING — CT CT HEAD W/O CM
3 series · 15 of 46 positions shown, 18 images · non-contrast
Comparison: None.

CLINICAL DATA: Central vertigo

EXAM:
CT HEAD WITHOUT CONTRAST
TECHNIQUE: Contiguous axial images were obtained from the base of the skull
through the vertex without intravenous contrast.

[Series 2: head wo · axial · 0.40mm/px · z∈[+587,+707]mm · 9 of 29 slices shown, 12 images]
[im 3/29  brain]
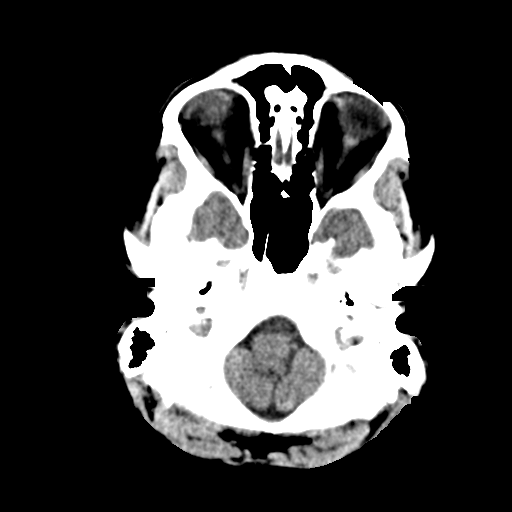
[im 3/29  bone]
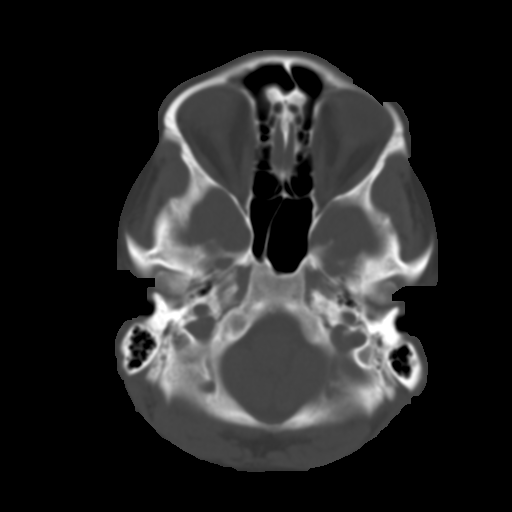
[im 6/29  brain]
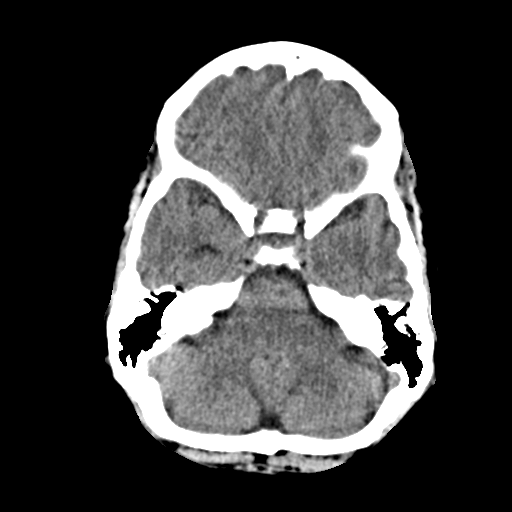
[im 9/29  brain]
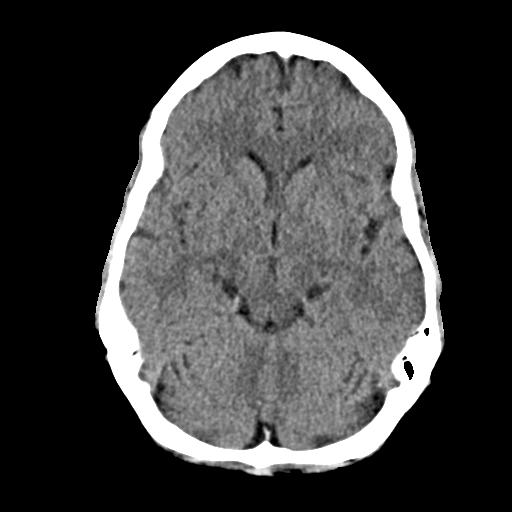
[im 12/29  brain]
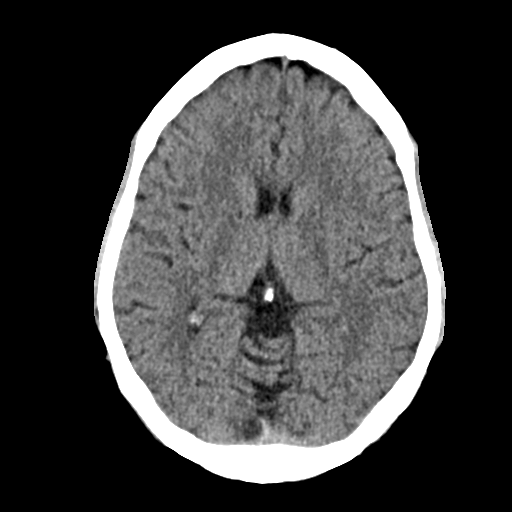
[im 15/29  brain]
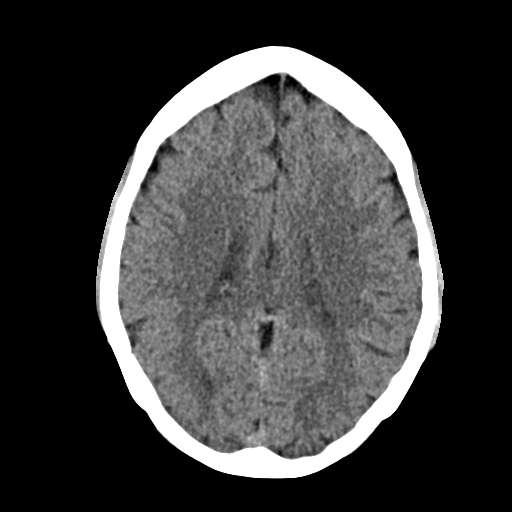
[im 15/29  bone]
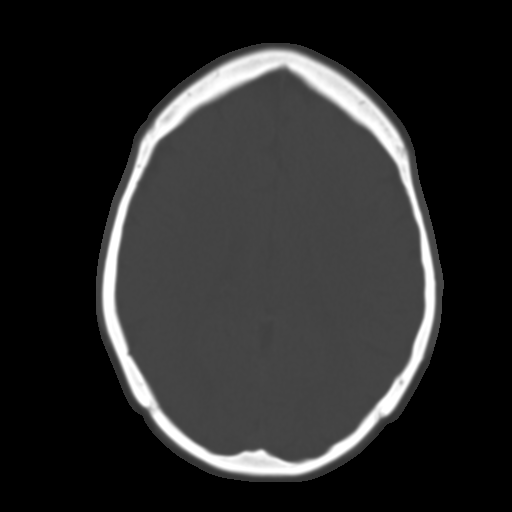
[im 18/29  brain]
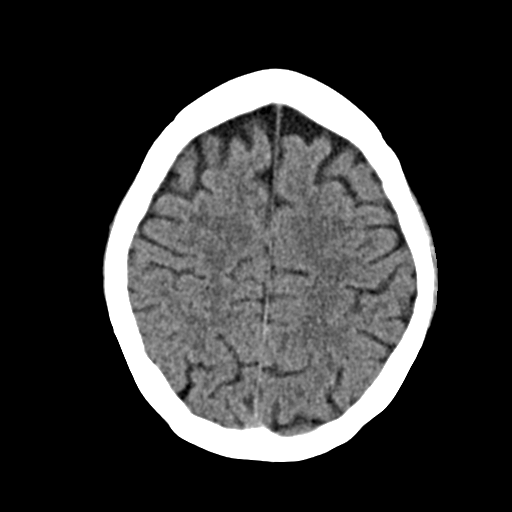
[im 21/29  brain]
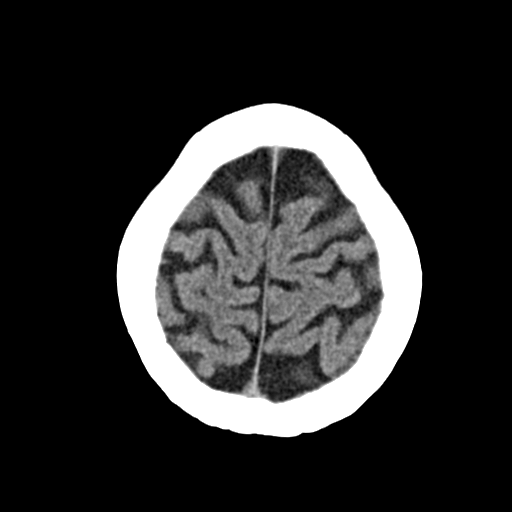
[im 24/29  brain]
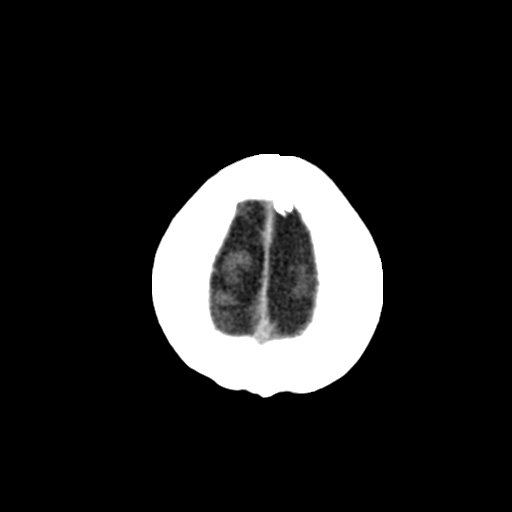
[im 27/29  brain]
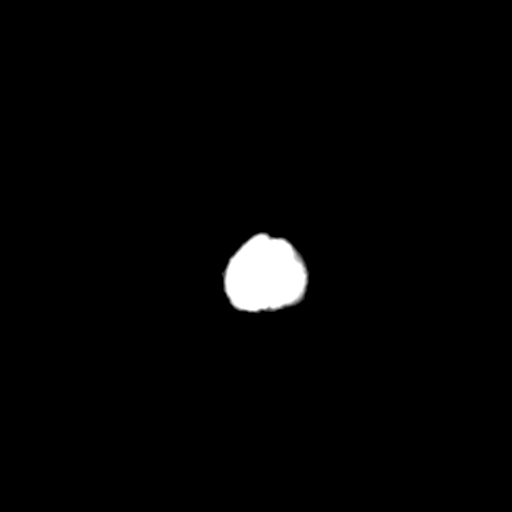
[im 27/29  bone]
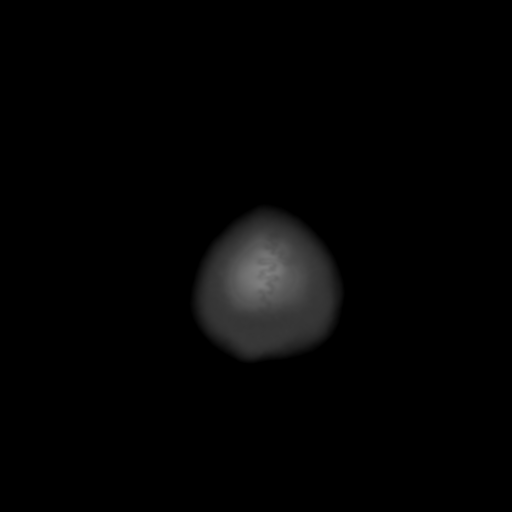

[Series 4: coronal soft · coronal · 0.28mm/px · 3 of 66 slices shown]
[im 22/66  brain]
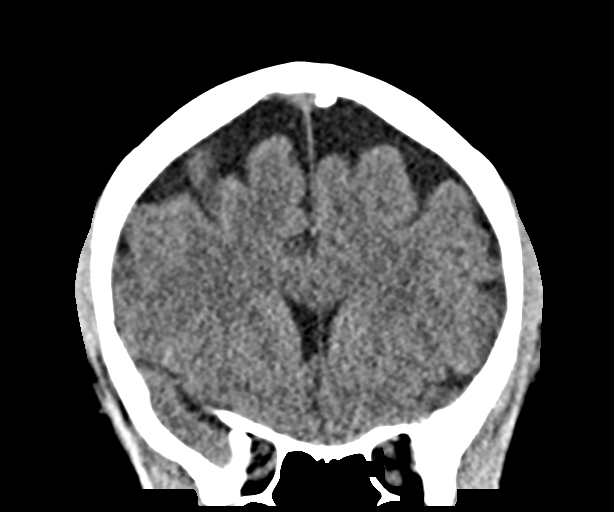
[im 29/66  brain]
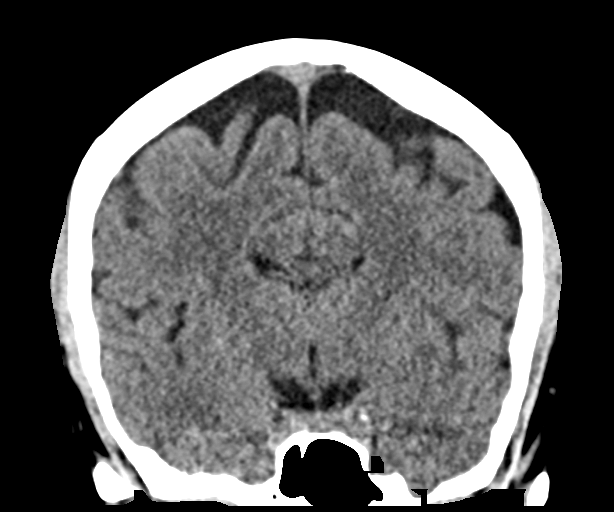
[im 37/66  brain]
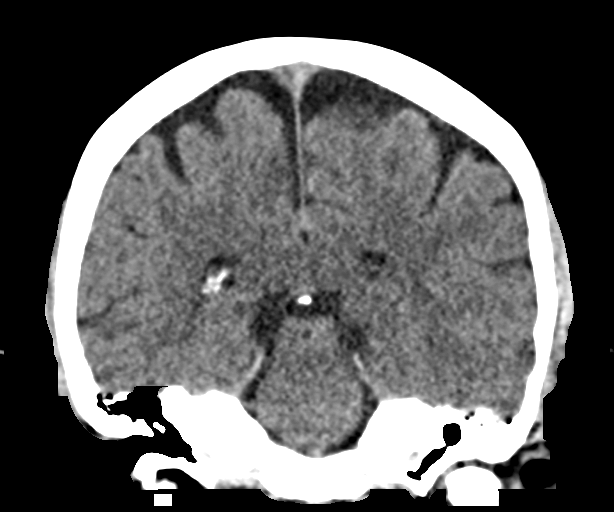

[Series 5: sag soft · sagittal · 0.28mm/px · 3 of 52 slices shown]
[im 18/52  brain]
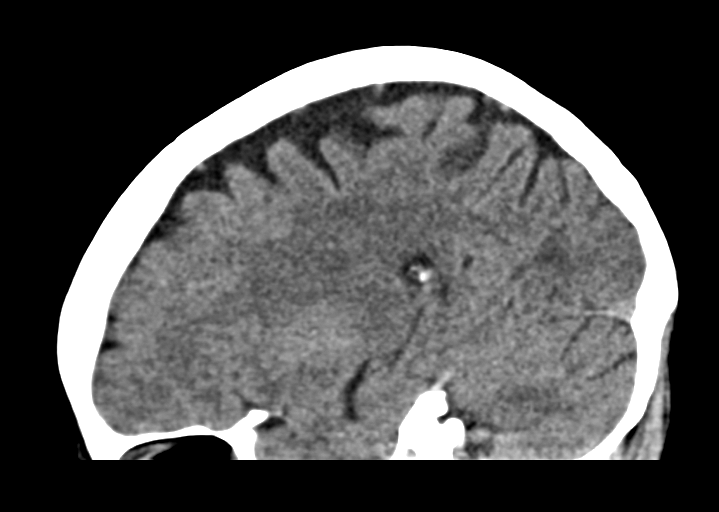
[im 26/52  brain]
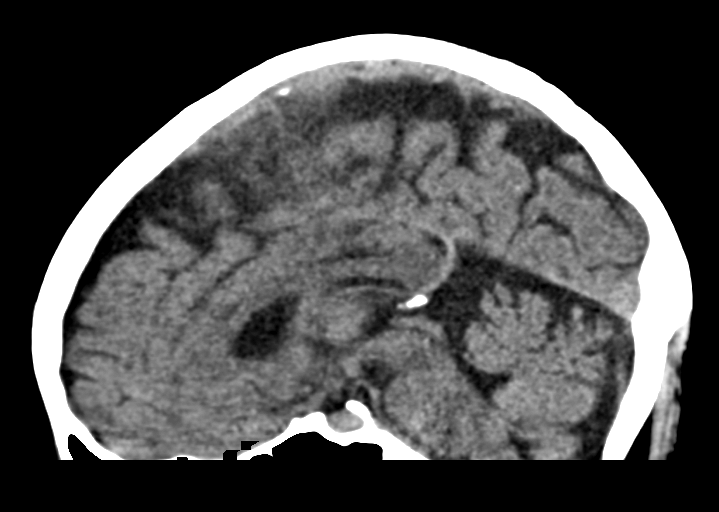
[im 35/52  brain]
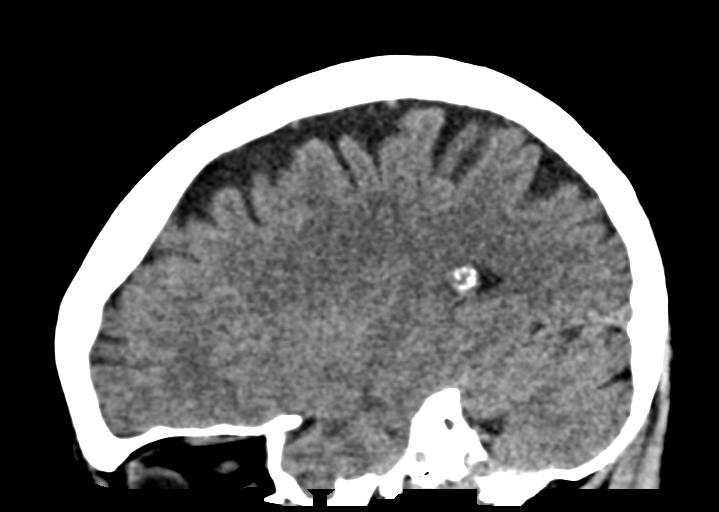

[15 of 46 positions shown; findings below may reference images not displayed]

FINDINGS: Brain: No evidence of acute infarction, hemorrhage, hydrocephalus,
extra-axial collection or mass lesion/mass effect.

Vascular: Negative for hyperdense vessel

Skull: Negative

Sinuses/Orbits: Paranasal sinuses clear. Mastoid clear bilaterally.
Negative orbit

Other: None
IMPRESSION: Negative CT head

## 2022-01-12 DIAGNOSIS — M25561 Pain in right knee: Secondary | ICD-10-CM | POA: Diagnosis not present

## 2022-01-26 DIAGNOSIS — Y999 Unspecified external cause status: Secondary | ICD-10-CM | POA: Diagnosis not present

## 2022-01-26 DIAGNOSIS — X58XXXA Exposure to other specified factors, initial encounter: Secondary | ICD-10-CM | POA: Diagnosis not present

## 2022-01-26 DIAGNOSIS — S83281A Other tear of lateral meniscus, current injury, right knee, initial encounter: Secondary | ICD-10-CM | POA: Diagnosis not present

## 2022-01-26 DIAGNOSIS — M7121 Synovial cyst of popliteal space [Baker], right knee: Secondary | ICD-10-CM | POA: Diagnosis not present

## 2022-01-26 DIAGNOSIS — G8918 Other acute postprocedural pain: Secondary | ICD-10-CM | POA: Diagnosis not present

## 2022-01-26 DIAGNOSIS — M1711 Unilateral primary osteoarthritis, right knee: Secondary | ICD-10-CM | POA: Diagnosis not present

## 2022-01-26 DIAGNOSIS — S83211A Bucket-handle tear of medial meniscus, current injury, right knee, initial encounter: Secondary | ICD-10-CM | POA: Diagnosis not present

## 2022-01-26 DIAGNOSIS — S83221A Peripheral tear of medial meniscus, current injury, right knee, initial encounter: Secondary | ICD-10-CM | POA: Diagnosis not present

## 2022-02-07 ENCOUNTER — Other Ambulatory Visit: Payer: Self-pay

## 2022-02-07 ENCOUNTER — Encounter (HOSPITAL_COMMUNITY): Payer: Self-pay

## 2022-02-07 ENCOUNTER — Emergency Department (HOSPITAL_BASED_OUTPATIENT_CLINIC_OR_DEPARTMENT_OTHER): Payer: BC Managed Care – PPO

## 2022-02-07 ENCOUNTER — Emergency Department (HOSPITAL_COMMUNITY)
Admission: EM | Admit: 2022-02-07 | Discharge: 2022-02-07 | Disposition: A | Discharge status: Home / Self Care | Payer: BC Managed Care – PPO | Attending: Emergency Medicine | Admitting: Emergency Medicine

## 2022-02-07 DIAGNOSIS — M7989 Other specified soft tissue disorders: Secondary | ICD-10-CM | POA: Diagnosis not present

## 2022-02-07 DIAGNOSIS — M25461 Effusion, right knee: Secondary | ICD-10-CM | POA: Diagnosis not present

## 2022-02-07 DIAGNOSIS — M25561 Pain in right knee: Secondary | ICD-10-CM

## 2022-02-07 LAB — CBC WITH DIFFERENTIAL/PLATELET
Abs Immature Granulocytes: 0.03 10*3/uL (ref 0.00–0.07)
Basophils Absolute: 0.1 10*3/uL (ref 0.0–0.1)
Basophils Relative: 1 %
Eosinophils Absolute: 0.2 10*3/uL (ref 0.0–0.5)
Eosinophils Relative: 3 %
HCT: 40.2 % (ref 36.0–46.0)
Hemoglobin: 13.5 g/dL (ref 12.0–15.0)
Immature Granulocytes: 0 %
Lymphocytes Relative: 25 %
Lymphs Abs: 1.9 10*3/uL (ref 0.7–4.0)
MCH: 32.9 pg (ref 26.0–34.0)
MCHC: 33.6 g/dL (ref 30.0–36.0)
MCV: 98 fL (ref 80.0–100.0)
Monocytes Absolute: 0.5 10*3/uL (ref 0.1–1.0)
Monocytes Relative: 7 %
Neutro Abs: 4.8 10*3/uL (ref 1.7–7.7)
Neutrophils Relative %: 64 %
Platelets: 292 10*3/uL (ref 150–400)
RBC: 4.1 MIL/uL (ref 3.87–5.11)
RDW: 13.4 % (ref 11.5–15.5)
WBC: 7.4 10*3/uL (ref 4.0–10.5)
nRBC: 0 % (ref 0.0–0.2)

## 2022-02-07 LAB — BASIC METABOLIC PANEL
Anion gap: 6 (ref 5–15)
BUN: 11 mg/dL (ref 6–20)
CO2: 25 mmol/L (ref 22–32)
Calcium: 9 mg/dL (ref 8.9–10.3)
Chloride: 105 mmol/L (ref 98–111)
Creatinine, Ser: 0.58 mg/dL (ref 0.44–1.00)
GFR, Estimated: >60 mL/min (ref >60)
Glucose, Bld: 93 mg/dL (ref 70–99)
Potassium: 4.5 mmol/L (ref 3.5–5.1)
Sodium: 136 mmol/L (ref 135–145)

## 2022-02-07 NOTE — ED Provider Notes (Signed)
[facility] DEPT Provider Note   CSN: [id number] Arrival date & time: 02/07/22  1105     History  Chief Complaint  Patient presents with   Knee Pain    Samantha Morales is a 51 y.o. female.  Patient presents with right knee swelling ongoing for a week.  Samantha Morales recently had arthroscopic surgery on her right knee about 12 days ago for a recurrent Baker's cyst in the right knee.  While they were doing the procedure the orthopedist repaired some meniscal injuries per patient.  Samantha Morales has noticed right knee and calf swelling since the day of surgery.  Denies any fevers or cough no vomiting or diarrhea no chest pain no shortness of breath reported.      Home Medications Prior to Admission medications   Medication Sig Start Date End Date Taking? Authorizing Provider  acetaminophen (TYLENOL) 325 MG tablet Take 650 mg by mouth every 6 (six) hours as needed.    [provider]  ALPRAZolam Duanne Moron) 0.25 MG tablet Take 1 tablet (0.25 mg total) by mouth 2 (two) times daily as needed for anxiety or sleep. 09/05/20   Mosie Lukes, MD  Ascorbic Acid (VITAMIN C) 1000 MG tablet Take 1,000 mg by mouth daily.    [provider]  Cholecalciferol (VITAMIN D3 PO) Take by mouth.    [provider]  Cyanocobalamin (VITAMIN B-12 PO) Take by mouth.    [provider]  doxycycline (VIBRA-TABS) 100 MG tablet Take 1 tablet (100 mg total) by mouth 2 (two) times daily. 11/03/21   Saguier, Percell Miller, PA-C  escitalopram (LEXAPRO) 20 MG tablet TAKE 1 TABLET BY MOUTH EVERY DAY 12/15/21   Mosie Lukes, MD  estradiol (ESTRACE) 0.1 MG/GM vaginal cream estradiol 0.01% (0.1 mg/gram) vaginal cream  Insert 1 applicatorful twice a week by vaginal route. Patient not taking: Reported on 05/27/2021    [provider]  omeprazole (PRILOSEC) 20 MG capsule 90TAKE 1 CAPSULE BY MOUTH EVERY DAY 03/19/20   Mosie Lukes, MD  zinc gluconate 50 MG tablet Take 50 mg by  mouth daily.    [provider]      Allergies    Erythromycin, Penicillins, and Sulfonamide derivatives    Review of Systems   Review of Systems  Constitutional:  Negative for fever.  HENT:  Negative for ear pain.   Eyes:  Negative for pain.  Respiratory:  Negative for cough.   Cardiovascular:  Negative for chest pain.  Gastrointestinal:  Negative for abdominal pain.  Genitourinary:  Negative for flank pain.  Musculoskeletal:  Negative for back pain.  Skin:  Negative for rash.  Neurological:  Negative for headaches.   Physical Exam Updated Vital Signs BP 119/81    Pulse 65    Temp 98.6 F (37 C) (Oral)    Resp 16    SpO2 99%  Physical Exam Constitutional:      General: Samantha Morales is not in acute distress.    Appearance: Normal appearance.  HENT:     Head: Normocephalic.     Nose: Nose normal.  Eyes:     Extraocular Movements: Extraocular movements intact.  Cardiovascular:     Rate and Rhythm: Normal rate.  Pulmonary:     Effort: Pulmonary effort is normal.  Musculoskeletal:        General: Normal range of motion.     Cervical back: Normal range of motion.     Right lower leg: Edema present.  Neurological:  General: No focal deficit present.     Mental Status: Samantha Morales is alert. Mental status is at baseline.    ED Results / Procedures / Treatments   Labs (all labs ordered are listed, but only abnormal results are displayed) Labs Reviewed  CBC WITH DIFFERENTIAL/PLATELET  BASIC METABOLIC PANEL    EKG None  Radiology VAS [place] LOWER EXTREMITY VENOUS (DVT) (7a-7p)  Result Date: 02/07/2022  Lower Venous DVT Study Patient Name:  ANTONIO WOODHAMS  Date of Exam:   02/07/2022 Medical Rec #: [id number]         Accession #:    [id number] Date of Birth: 1971/12/22         Patient Gender: F Patient Age:   51 years Exam Location:  [facility] Procedure:      VAS [place] LOWER EXTREMITY VENOUS (DVT) Referring Phys: CHRISTIAN PROSPERI  --------------------------------------------------------------------------------  Indications: Right knee/calf swelling and pain s/ arthroscopy on 01-26-22.  Comparison Study: No prior studies. Performing Technologist: Darlin Coco RDMS, RVT  Examination Guidelines: A complete evaluation includes B-mode imaging, spectral Doppler, color Doppler, and power Doppler as needed of all accessible portions of each vessel. Bilateral testing is considered an integral part of a complete examination. Limited examinations for reoccurring indications may be performed as noted. The reflux portion of the exam is performed with the patient in reverse Trendelenburg.  +---------+---------------+---------+-----------+----------+--------------+  RIGHT     Compressibility Phasicity Spontaneity Properties Thrombus Aging  +---------+---------------+---------+-----------+----------+--------------+  CFV       Full            Yes       Yes                                    +---------+---------------+---------+-----------+----------+--------------+  SFJ       Full                                                             +---------+---------------+---------+-----------+----------+--------------+  FV Prox   Full                                                             +---------+---------------+---------+-----------+----------+--------------+  FV Mid    Full                                                             +---------+---------------+---------+-----------+----------+--------------+  FV Distal Full                                                             +---------+---------------+---------+-----------+----------+--------------+  PFV       Full                                                             +---------+---------------+---------+-----------+----------+--------------+  POP       Full            Yes       Yes                                    +---------+---------------+---------+-----------+----------+--------------+   PTV       Full                                                             +---------+---------------+---------+-----------+----------+--------------+  PERO      Full                                                             +---------+---------------+---------+-----------+----------+--------------+  Gastroc   Full                                                             +---------+---------------+---------+-----------+----------+--------------+   +----+---------------+---------+-----------+----------+--------------+  LEFT Compressibility Phasicity Spontaneity Properties Thrombus Aging  +----+---------------+---------+-----------+----------+--------------+  CFV  Full            Yes       Yes                                    +----+---------------+---------+-----------+----------+--------------+     Summary: RIGHT: - There is no evidence of deep vein thrombosis in the lower extremity.  - Incidental: A large, heterogrenous collection is observed extending from the popliteal fossa into the proximal calf at the area of most concern.  LEFT: - No evidence of common femoral vein obstruction.  *See table(s) above for measurements and observations.    Preliminary     Procedures Procedures    Medications Ordered in ED Medications - No data to display  ED Course/ Medical Decision Making/ A&P                           Medical Decision Making  Chart review shows an office visit on November 03, 2021 for hyperglycemia.  Vital signs arrival within normal limits  Blood test was sent and is unremarkable white count normal chemistry normal.  Ultrasound shows no evidence of DVT but a fluid-filled collection seen in the right popliteal fossa, suspect recurrent Baker's cyst.  Patient has an appointment with orthopedist in 2 days which advised her to keep.  Advising immediate return for fevers pain or any additional concerns.        Final Clinical Impression(s) / ED Diagnoses Final diagnoses:   Effusion of right knee    Rx / DC Orders ED Discharge Orders     None         Luna Fuse, MD 02/07/22 1727

## 2022-02-07 NOTE — ED Triage Notes (Signed)
Pt arrived via POV, states surgery on right knee 1/30, worsening swelling since. No recent injury to area after surgery. No other complications since.

## 2022-02-07 NOTE — Progress Notes (Signed)
Lower extremity venous RT study completed.  Preliminary results relayed to [facility], PA in ED triage.  See CV Proc for preliminary results report.   Darlin Coco, RDMS, RVT

## 2022-02-07 NOTE — Discharge Instructions (Signed)
Call your orthopedic doctor in the next 2-3 days.   Return immediately back to the ER if:  Your symptoms worsen within the next 12-24 hours. You develop new symptoms such as new fevers, persistent vomiting, new pain, shortness of breath, or new weakness or numbness, or if you have any other concerns.

## 2022-02-07 NOTE — ED Provider Triage Note (Signed)
Emergency Medicine Provider Triage Evaluation Note  Samantha Morales , a 51 y.o. female  was evaluated in triage.  Pt complains of recent arthroscopy of right leg, now with calf swelling, right thigh pain. Also feels some pleuritic pain on the right side of the chest. No hx of clots, did recently drive to [place]. No hx of cancer.  Review of Systems  Positive: Leg swelling, shob, back pain Negative: Chest pain  Physical Exam  BP (!) 145/85 (BP Location: Left Arm)    Pulse (!) 59    Temp 98.6 F (37 C) (Oral)    Resp 16    SpO2 93%  Gen:   Awake, no distress   Resp:  Normal effort  MSK:   Moves extremities without difficulty  Other:  Diameter of right calf compared to left calf is slightly greater, no focal swelling, redness  Medical Decision Making  Medically screening exam initiated at 12:09 PM.  Appropriate orders placed.  Samantha Morales was informed that the remainder of the evaluation will be completed by another provider, this initial triage assessment does not replace that evaluation, and the importance of remaining in the ED until their evaluation is complete.  Workup initiated   Samantha Morales, [place] 02/07/22 1222

## 2022-02-11 DIAGNOSIS — M25561 Pain in right knee: Secondary | ICD-10-CM | POA: Diagnosis not present

## 2022-02-17 DIAGNOSIS — Z4889 Encounter for other specified surgical aftercare: Secondary | ICD-10-CM | POA: Diagnosis not present

## 2022-03-03 DIAGNOSIS — M25561 Pain in right knee: Secondary | ICD-10-CM | POA: Diagnosis not present

## 2022-03-03 DIAGNOSIS — M5451 Vertebrogenic low back pain: Secondary | ICD-10-CM | POA: Diagnosis not present

## 2022-03-03 DIAGNOSIS — Z4889 Encounter for other specified surgical aftercare: Secondary | ICD-10-CM | POA: Diagnosis not present

## 2022-03-03 DIAGNOSIS — M5459 Other low back pain: Secondary | ICD-10-CM | POA: Diagnosis not present

## 2022-03-11 DIAGNOSIS — M5451 Vertebrogenic low back pain: Secondary | ICD-10-CM | POA: Diagnosis not present

## 2022-03-23 DIAGNOSIS — M7061 Trochanteric bursitis, right hip: Secondary | ICD-10-CM | POA: Diagnosis not present

## 2022-03-23 DIAGNOSIS — M5451 Vertebrogenic low back pain: Secondary | ICD-10-CM | POA: Diagnosis not present

## 2022-04-14 DIAGNOSIS — M25561 Pain in right knee: Secondary | ICD-10-CM | POA: Diagnosis not present

## 2022-04-28 DIAGNOSIS — M5451 Vertebrogenic low back pain: Secondary | ICD-10-CM | POA: Diagnosis not present

## 2022-04-28 DIAGNOSIS — M25561 Pain in right knee: Secondary | ICD-10-CM | POA: Diagnosis not present

## 2022-05-05 ENCOUNTER — Encounter: Payer: Self-pay | Admitting: Family Medicine

## 2022-05-05 ENCOUNTER — Other Ambulatory Visit (HOSPITAL_BASED_OUTPATIENT_CLINIC_OR_DEPARTMENT_OTHER): Payer: Self-pay

## 2022-05-05 ENCOUNTER — Ambulatory Visit: Payer: BC Managed Care – PPO | Admitting: Family Medicine

## 2022-05-05 VITALS — BP 128/80 | HR 70 | Resp 20 | Ht 67.0 in | Wt 183.4 lb

## 2022-05-05 DIAGNOSIS — D649 Anemia, unspecified: Secondary | ICD-10-CM

## 2022-05-05 DIAGNOSIS — Z79899 Other long term (current) drug therapy: Secondary | ICD-10-CM | POA: Diagnosis not present

## 2022-05-05 DIAGNOSIS — E559 Vitamin D deficiency, unspecified: Secondary | ICD-10-CM | POA: Diagnosis not present

## 2022-05-05 DIAGNOSIS — G8929 Other chronic pain: Secondary | ICD-10-CM

## 2022-05-05 DIAGNOSIS — M549 Dorsalgia, unspecified: Secondary | ICD-10-CM

## 2022-05-05 DIAGNOSIS — F17201 Nicotine dependence, unspecified, in remission: Secondary | ICD-10-CM

## 2022-05-05 DIAGNOSIS — R252 Cramp and spasm: Secondary | ICD-10-CM

## 2022-05-05 DIAGNOSIS — R739 Hyperglycemia, unspecified: Secondary | ICD-10-CM

## 2022-05-05 DIAGNOSIS — M25561 Pain in right knee: Secondary | ICD-10-CM

## 2022-05-05 LAB — CBC
HCT: 39.3 % (ref 36.0–46.0)
Hemoglobin: 13.1 g/dL (ref 12.0–15.0)
MCHC: 33.5 g/dL (ref 30.0–36.0)
MCV: 99.9 fl (ref 78.0–100.0)
Platelets: 232 10*3/uL (ref 150.0–400.0)
RBC: 3.93 Mil/uL (ref 3.87–5.11)
RDW: 13.9 % (ref 11.5–15.5)
WBC: 6 10*3/uL (ref 4.0–10.5)

## 2022-05-05 LAB — COMPREHENSIVE METABOLIC PANEL
ALT: 15 U/L (ref 0–35)
AST: 17 U/L (ref 0–37)
Albumin: 4.6 g/dL (ref 3.5–5.2)
Alkaline Phosphatase: 83 U/L (ref 39–117)
BUN: 12 mg/dL (ref 6–23)
CO2: 26 mEq/L (ref 19–32)
Calcium: 9.5 mg/dL (ref 8.4–10.5)
Chloride: 101 mEq/L (ref 96–112)
Creatinine, Ser: 0.68 mg/dL (ref 0.40–1.20)
GFR: 101.22 mL/min (ref >60.00)
Glucose, Bld: 79 mg/dL (ref 70–99)
Potassium: 4.4 mEq/L (ref 3.5–5.1)
Sodium: 136 mEq/L (ref 135–145)
Total Bilirubin: 0.8 mg/dL (ref 0.2–1.2)
Total Protein: 7.4 g/dL (ref 6.0–8.3)

## 2022-05-05 LAB — LIPID PANEL
Cholesterol: 203 mg/dL — ABNORMAL HIGH (ref 0–200)
HDL: 112.1 mg/dL (ref >39.00)
LDL Cholesterol: 72 mg/dL (ref 0–99)
NonHDL: 90.77
Total CHOL/HDL Ratio: 2
Triglycerides: 92 mg/dL (ref 0.0–149.0)
VLDL: 18.4 mg/dL (ref 0.0–40.0)

## 2022-05-05 LAB — HEMOGLOBIN A1C: Hgb A1c MFr Bld: 5.5 % (ref 4.6–6.5)

## 2022-05-05 LAB — VITAMIN D 25 HYDROXY (VIT D DEFICIENCY, FRACTURES): VITD: 36.59 ng/mL (ref 30.00–100.00)

## 2022-05-05 LAB — MAGNESIUM: Magnesium: 2.1 mg/dL (ref 1.5–2.5)

## 2022-05-05 LAB — TSH: TSH: 0.96 u[IU]/mL (ref 0.35–5.50)

## 2022-05-05 MED ORDER — ESTRADIOL 0.1 MG/GM VA CREA
1.0000 | TOPICAL_CREAM | VAGINAL | 2 refills | Status: AC
  Filled 2022-05-05: qty 42.5, 90d supply, fill #0

## 2022-05-05 MED ORDER — TRAZODONE HCL 50 MG PO TABS: 25.0000 mg | ORAL_TABLET | Freq: Every evening | ORAL | 3 refills | Status: DC | PRN

## 2022-05-05 MED ORDER — ESCITALOPRAM OXALATE 20 MG PO TABS: 20.0000 mg | ORAL_TABLET | Freq: Every day | ORAL | 1 refills | Status: DC

## 2022-05-05 NOTE — Assessment & Plan Note (Addendum)
Now only smokes occasionally when she drinks socially. She is encouraged to try quitting drinking altogether for 6 months to see if she can break the association ?

## 2022-05-05 NOTE — Assessment & Plan Note (Signed)
Encouraged moist heat and gentle stretching as tolerated. May try NSAIDs and prescription meds as directed and report if symptoms worsen or seek immediate care. Is following with ortho and they are considering an epidural injection but she is hesitant until she gets a second opinion ?

## 2022-05-05 NOTE — Assessment & Plan Note (Signed)
Supplement and monitor 

## 2022-05-05 NOTE — Assessment & Plan Note (Signed)
hgba1c acceptable, minimize simple carbs. Increase exercise as tolerated.  

## 2022-05-05 NOTE — Progress Notes (Signed)
? ?Subjective:  ? ?By signing my name below, I, Zite Okoli, attest that this documentation has been prepared under the direction and in the presence of Mosie Lukes, MD. 05/05/2022  ?  ? ? Patient ID: Samantha Morales, female    DOB: [birth year], 51 y.o.   MRN: [id number] ? ?Chief Complaint  ?Patient presents with  ? Follow-up  ? ? ?HPI ?Patient is in today for an office visit and 6 month f/u ? ?She stopped taking Ambien and Xanax because she does not think she needs them anymore. They were not effective. ? ?She has been visiting Dr Tonita Cong for drainage of the cyst on her right knee that is causing pain. Last drainage was 2 and a half weeks ago but she reports the cyst is full again. Last MRI was on Sunday.  Notes that when she drains it, the knee and back pain are resolved for about 4-5 days. She complains of back pain and hip pain but is not sure what is causing it. Was told she might have to get an epidural but she is not open to it.  ? ?She complains of cramps starting from her knees to her feet that occur at night. She adds she drinks a lot of water.  ? ?She reports her last menstrual cycle was one and a half years ago. She has occasional late night sweats and hot flashes. She adds sexual intercourse has also become painful. ? ?She is a social smoker, only smokes when she drinks alcohol. She is reducing caffeine intake. ? ?She is requesting for a refill on 20 mg lexapro. She would like to add another medication that will help her sleep.  ? ?Past Medical History:  ?Diagnosis Date  ? Acute upper respiratory infection 10/26/2016  ? Allergy   ? Anemia 03/13/2015  ? ANEMIA, HX OF 01/20/2011  ? Anxiety   ? Atypical chest pain 04/15/2012  ? Back pain 07/22/2012  ? Cellulitis of ankle 05/12/2011  ? CHICKENPOX, HX OF 01/20/2011  ? Elevated BP 04/15/2012  ? Epistaxis 10/26/2016  ? GERD 01/20/2011  ? Hyperglycemia 05/10/2016  ? Insomnia 12/12/2012  ? LACERATION OF FINGER 01/27/2011  ? Overweight 01/19/2016  ? Preventative health care  02/28/2014  ? Sacroiliac joint pain 07/22/2012  ? Sinusitis, acute 12/12/2012  ? Tobacco abuse 05/12/2011  ? 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 ? ?Past Surgical History:  ?Procedure Laterality Date  ? COLONOSCOPY    ? x2 in 20's & 30's - for hemorrhoids  ? lasik eye surgery X b/l    ? WISDOM TOOTH EXTRACTION    ? x4  ? ? ?Family History  ?Problem Relation Age of Onset  ? Hypertension Mother   ? Arrhythmia Mother   ? Lupus Sister   ? Hypertension Brother   ? Epilepsy Brother   ? Cancer Maternal Grandmother   ?     breast  ? Alzheimer's disease Maternal Grandmother   ? Aneurysm Maternal Grandfather   ?     brain  ? Arthritis Maternal Grandfather   ?     rheumatoid  ? Nephrolithiasis Paternal Grandmother   ? Heart attack Paternal Grandfather   ? Anemia Brother   ? Neuromuscular disorder Neg Hx   ? Colon cancer Neg Hx   ? Esophageal cancer Neg Hx   ? Rectal cancer Neg Hx   ? Stomach cancer Neg Hx   ? ? ?Social History  ? ?Socioeconomic History  ? Marital status: Married  ?  Spouse name: Not on file  ? Number of children: Not on file  ? Years of education: Not on file  ? Highest education level: Not on file  ?Occupational History  ? Not on file  ?Tobacco Use  ? Smoking status: Some Days  ?  Packs/day: 0.50  ?  Types: Cigarettes  ?  Last attempt to quit: 04/12/2012  ?  Years since quitting: 10.0  ? Smokeless tobacco: Never  ? Tobacco comments:  ?  smokes when social  ?Vaping Use  ? Vaping Use: Never used  ?Substance and Sexual Activity  ? Alcohol use: Yes  ?  Alcohol/week: 0.0 standard drinks  ?  Comment: socially  ? Drug use: No  ? Sexual activity: Yes  ?  Partners: Male  ?  Comment: no dietary, lives with husband, wears seatbelt  ?Other Topics Concern  ? Not on file  ?Social History Narrative  ? Not on file  ? ?Social Determinants of Health  ? ?Financial Resource Strain: Not on file  ?Food Insecurity: Not on file  ?Transportation Needs: Not on file  ?Physical Activity: Not on file  ?Stress: Not on file  ?Social  Connections: Not on file  ?Intimate Partner Violence: Not on file  ? ? ?Outpatient Medications Prior to Visit  ?Medication Sig Dispense Refill  ? acetaminophen (TYLENOL) 325 MG tablet Take 650 mg by mouth every 6 (six) hours as needed.    ? Ascorbic Acid (VITAMIN C) 1000 MG tablet Take 1,000 mg by mouth daily.    ? Cholecalciferol (VITAMIN D3 PO) Take by mouth.    ? Cyanocobalamin (VITAMIN B-12 PO) Take by mouth.    ? omeprazole (PRILOSEC) 20 MG capsule 90TAKE 1 CAPSULE BY MOUTH EVERY DAY 90 capsule 1  ? TURMERIC PO Take 1,000 mg by mouth daily.    ? zinc gluconate 50 MG tablet Take 50 mg by mouth daily.    ? ALPRAZolam (XANAX) 0.25 MG tablet Take 1 tablet (0.25 mg total) by mouth 2 (two) times daily as needed for anxiety or sleep. 60 tablet 3  ? doxycycline (VIBRA-TABS) 100 MG tablet Take 1 tablet (100 mg total) by mouth 2 (two) times daily. 20 tablet 0  ? escitalopram (LEXAPRO) 20 MG tablet TAKE 1 TABLET BY MOUTH EVERY DAY 90 tablet 1  ? estradiol (ESTRACE) 0.1 MG/GM vaginal cream estradiol 0.01% (0.1 mg/gram) vaginal cream ? Insert 1 applicatorful twice a week by vaginal route. (Patient not taking: Reported on 05/27/2021)    ? ?No facility-administered medications prior to visit.  ? ? ?Allergies  ?Allergen Reactions  ? Erythromycin   ?  REACTION: rash  ? Penicillins   ?  REACTION: swelling, rash  ? Sulfonamide Derivatives   ?  REACTION: GI upset  ? ? ?Review of Systems  ?Constitutional:  Negative for fever and malaise/fatigue.  ?HENT:  Negative for congestion.   ?Eyes:  Negative for redness.  ?Respiratory:  Negative for shortness of breath.   ?Cardiovascular:  Negative for chest pain, palpitations and leg swelling.  ?Gastrointestinal:  Negative for abdominal pain, blood in stool and nausea.  ?Genitourinary:  Negative for dysuria and frequency.  ?Musculoskeletal:  Positive for back pain and joint pain (right knee, right hip). Negative for falls.  ?     (+) muscle cramps  ?Skin:  Negative for rash.  ?Neurological:   Negative for dizziness, loss of consciousness and headaches.  ?Endo/Heme/Allergies:  Negative for polydipsia.  ?Psychiatric/Behavioral:  Negative for depression. The patient is not nervous/anxious.   ? ?   ?  Objective:  ?  ?Physical Exam ? ?BP 128/80 (BP Location: Left Arm, Patient Position: Sitting, Cuff Size: Normal)   Pulse 70   Resp 20   Ht '5\' 7"'$  (1.702 m)   Wt 183 lb 6.4 oz (83.2 kg)   SpO2 99%   BMI 28.72 kg/m?  ?Wt Readings from Last 3 Encounters:  ?05/05/22 183 lb 6.4 oz (83.2 kg)  ?02/07/22 180 lb (81.6 kg)  ?11/03/21 185 lb (83.9 kg)  ? ? ?Diabetic Foot Exam - Simple   ?No data filed ?  ? ?Lab Results  ?Component Value Date  ? WBC 7.4 02/07/2022  ? HGB 13.5 02/07/2022  ? HCT 40.2 02/07/2022  ? PLT 292 02/07/2022  ? GLUCOSE 93 02/07/2022  ? CHOL 183 03/10/2021  ? TRIG 79.0 03/10/2021  ? HDL 100.30 03/10/2021  ? [place] 67 03/10/2021  ? ALT 17 11/03/2021  ? AST 19 11/03/2021  ? NA 136 02/07/2022  ? K 4.5 02/07/2022  ? CL 105 02/07/2022  ? CREATININE 0.58 02/07/2022  ? BUN 11 02/07/2022  ? CO2 25 02/07/2022  ? TSH 1.00 02/27/2020  ? INR 1.0 12/05/2015  ? HGBA1C 5.6 11/03/2021  ? ? ?Lab Results  ?Component Value Date  ? TSH 1.00 02/27/2020  ? ?Lab Results  ?Component Value Date  ? WBC 7.4 02/07/2022  ? HGB 13.5 02/07/2022  ? HCT 40.2 02/07/2022  ? MCV 98.0 02/07/2022  ? PLT 292 02/07/2022  ? ?Lab Results  ?Component Value Date  ? NA 136 02/07/2022  ? K 4.5 02/07/2022  ? CO2 25 02/07/2022  ? GLUCOSE 93 02/07/2022  ? BUN 11 02/07/2022  ? CREATININE 0.58 02/07/2022  ? BILITOT 0.5 11/03/2021  ? ALKPHOS 81 11/03/2021  ? AST 19 11/03/2021  ? ALT 17 11/03/2021  ? PROT 7.3 11/03/2021  ? ALBUMIN 4.5 11/03/2021  ? CALCIUM 9.0 02/07/2022  ? ANIONGAP 6 02/07/2022  ? GFR 91.39 11/03/2021  ? ?Lab Results  ?Component Value Date  ? CHOL 183 03/10/2021  ? ?Lab Results  ?Component Value Date  ? HDL 100.30 03/10/2021  ? ?Lab Results  ?Component Value Date  ? [place] 67 03/10/2021  ? ?Lab Results  ?Component Value Date  ?  TRIG 79.0 03/10/2021  ? ?Lab Results  ?Component Value Date  ? CHOLHDL 2 03/10/2021  ? ?Lab Results  ?Component Value Date  ? HGBA1C 5.6 11/03/2021  ? ? ?   ?Assessment & Plan:  ? ?Problem List Items Addressed This

## 2022-05-05 NOTE — Assessment & Plan Note (Signed)
She is following with Dr Tonita Cong of [facility] thye have drained a Baker's cyst 4 times and it keeps returning. Right after draining she feels better but then cyst and pain recur. She is about to have a second opinion. For now ice, elevate and topical treatments ?

## 2022-05-05 NOTE — Patient Instructions (Addendum)
[employer] makes clean products. ? ?[place]'s leg cramp medicine OTC ?Try rubbing with biofreeze or lidocaine  ?CoverMyMeds or GoodRx can compare prices for you ? ? ?Atrophic Vaginitis ? ?Atrophic vaginitis is a condition in which the tissues that line the vagina become dry and thin. This condition is most common in women who have stopped having regular menstrual periods (are in menopause). This usually starts when a woman is 26 to 51 years old. That is the time when a woman's estrogen levels begin to decrease. ?Estrogen is a female hormone. It helps to keep the tissues of the vagina moist. It stimulates the vagina to produce a clear fluid that lubricates the vagina for sex. This fluid also protects the vagina from infection. Lack of estrogen can cause the lining of the vagina to get thinner and dryer. The vagina may also shrink in size. It may become less elastic. Atrophic vaginitis tends to get worse over time as a woman's estrogen level drops. ?What are the causes? ?This condition is caused by the normal drop in estrogen that happens around the time of menopause. ?What increases the risk? ?Certain conditions or situations may lower a woman's estrogen level, leading to a higher risk for atrophic vaginitis. You are more likely to develop this condition if: ?You are taking medicines that block estrogen. ?You have had your ovaries removed. ?You are being treated for cancer with radiation or medicines (chemotherapy). ?You have given birth or are breastfeeding. ?You are older than age 51. ?You smoke. ?What are the signs or symptoms? ?Symptoms of this condition include: ?Pain, soreness, a feeling of pressure, or bleeding during sex (dyspareunia). ?Vaginal burning, irritation, or itching. ?Pain or bleeding when a speculum is used in a vaginal exam. ?Having burning pain while urinating. ?Vaginal discharge. ?In some cases, there are no symptoms. ?How is this diagnosed? ?This condition is diagnosed based on your medical  history and a physical exam. This will include a pelvic exam that checks the vaginal tissues. Though rare, you may also have other tests, including: ?A urine test. ?A test that checks the acid balance in your vagina (acid balance test). ?How is this treated? ?Treatment for this condition depends on how severe your symptoms are. Treatment may include: ?Using an over-the-counter vaginal lubricant before sex. ?Using a long-acting vaginal moisturizer. ?Using low-dose estrogen for moderate to severe symptoms that do not respond to other treatments. Options include creams, tablets, and inserts (vaginal rings). Before you use a vaginal estrogen, tell your health care provider if you have a history of: ?Breast cancer. ?Endometrial cancer. ?Blood clots. ?If you are not sexually active and your symptoms are very mild, you may not need treatment. ?Follow these instructions at home: ?Medicines ?Take over-the-counter and prescription medicines only as told by your health care provider. ?Do not use herbal or alternative medicines unless your health care provider says that you can. ?Use over-the-counter creams, lubricants, or moisturizers for dryness only as told by your health care provider. ?General instructions ?If your atrophic vaginitis is caused by menopause, discuss all of your menopause symptoms and treatment options with your health care provider. ?Do not douche. ?Do not use products that can make your vagina dry. These include: ?Scented feminine sprays. ?Scented tampons. ?Scented soaps. ?Vaginal sex can help to improve blood flow and elasticity of vaginal tissue. If you choose to have sex and it hurts, try using a water-soluble lubricant or moisturizer right before having sex. ?Contact a health care provider if: ?Your discharge looks different than normal. ?  Your vagina has an unusual smell. ?You have new symptoms. ?Your symptoms do not improve with treatment. ?Your symptoms get worse. ?Summary ?Atrophic vaginitis is a  condition in which the tissues that line the vagina become dry and thin. It is most common in women who have stopped having regular menstrual periods (are in menopause). ?Treatment options include using vaginal lubricants and low-dose vaginal estrogen. ?Contact a health care provider if your vagina has an unusual smell, or if your symptoms get worse or do not improve after treatment. ?This information is not intended to replace advice given to you by your health care provider. Make sure you discuss any questions you have with your health care provider. ?Document Revised: 06/13/2020 Document Reviewed: 06/13/2020 ?Elsevier Patient Education ? [place]. ? ?

## 2022-05-15 DIAGNOSIS — M7121 Synovial cyst of popliteal space [Baker], right knee: Secondary | ICD-10-CM | POA: Diagnosis not present

## 2022-05-26 DIAGNOSIS — M7121 Synovial cyst of popliteal space [Baker], right knee: Secondary | ICD-10-CM | POA: Diagnosis not present

## 2022-05-29 ENCOUNTER — Other Ambulatory Visit: Payer: Self-pay | Admitting: Family Medicine

## 2022-06-10 DIAGNOSIS — G8918 Other acute postprocedural pain: Secondary | ICD-10-CM | POA: Diagnosis not present

## 2022-06-10 DIAGNOSIS — M6751 Plica syndrome, right knee: Secondary | ICD-10-CM | POA: Diagnosis not present

## 2022-06-10 DIAGNOSIS — M659 Synovitis and tenosynovitis, unspecified: Secondary | ICD-10-CM | POA: Diagnosis not present

## 2022-06-10 DIAGNOSIS — M948X6 Other specified disorders of cartilage, lower leg: Secondary | ICD-10-CM | POA: Diagnosis not present

## 2022-06-10 DIAGNOSIS — M794 Hypertrophy of (infrapatellar) fat pad: Secondary | ICD-10-CM | POA: Diagnosis not present

## 2022-06-10 DIAGNOSIS — M94261 Chondromalacia, right knee: Secondary | ICD-10-CM | POA: Diagnosis not present

## 2022-06-10 DIAGNOSIS — M7121 Synovial cyst of popliteal space [Baker], right knee: Secondary | ICD-10-CM | POA: Diagnosis not present

## 2022-06-10 DIAGNOSIS — M65861 Other synovitis and tenosynovitis, right lower leg: Secondary | ICD-10-CM | POA: Diagnosis not present

## 2022-07-02 DIAGNOSIS — Z01419 Encounter for gynecological examination (general) (routine) without abnormal findings: Secondary | ICD-10-CM | POA: Diagnosis not present

## 2022-07-02 DIAGNOSIS — Z6829 Body mass index (BMI) 29.0-29.9, adult: Secondary | ICD-10-CM | POA: Diagnosis not present

## 2022-07-02 DIAGNOSIS — Z1231 Encounter for screening mammogram for malignant neoplasm of breast: Secondary | ICD-10-CM | POA: Diagnosis not present

## 2022-07-06 ENCOUNTER — Other Ambulatory Visit: Payer: Self-pay | Admitting: Obstetrics and Gynecology

## 2022-07-06 DIAGNOSIS — R928 Other abnormal and inconclusive findings on diagnostic imaging of breast: Secondary | ICD-10-CM

## 2022-07-07 DIAGNOSIS — Z4789 Encounter for other orthopedic aftercare: Secondary | ICD-10-CM | POA: Diagnosis not present

## 2022-07-10 ENCOUNTER — Ambulatory Visit: Payer: BC Managed Care – PPO

## 2022-07-10 ENCOUNTER — Ambulatory Visit
Admission: RE | Admit: 2022-07-10 | Discharge: 2022-07-10 | Disposition: A | Discharge status: Home / Self Care | Payer: BC Managed Care – PPO | Source: Ambulatory Visit | Attending: Obstetrics and Gynecology | Admitting: Obstetrics and Gynecology

## 2022-07-10 DIAGNOSIS — R928 Other abnormal and inconclusive findings on diagnostic imaging of breast: Secondary | ICD-10-CM | POA: Diagnosis not present

## 2022-08-18 ENCOUNTER — Encounter: Payer: Self-pay | Admitting: Family Medicine

## 2022-08-20 ENCOUNTER — Other Ambulatory Visit: Payer: Self-pay | Admitting: Family Medicine

## 2022-08-20 MED ORDER — SCOPOLAMINE 1 MG/3DAYS TD PT72: 1.0000 | MEDICATED_PATCH | TRANSDERMAL | 0 refills | Status: DC

## 2022-08-20 NOTE — Telephone Encounter (Signed)
Called pt and she stated its a 7 day cruise

## 2022-08-29 DIAGNOSIS — Z03818 Encounter for observation for suspected exposure to other biological agents ruled out: Secondary | ICD-10-CM | POA: Diagnosis not present

## 2022-08-29 DIAGNOSIS — R1111 Vomiting without nausea: Secondary | ICD-10-CM | POA: Diagnosis not present

## 2022-08-29 DIAGNOSIS — R197 Diarrhea, unspecified: Secondary | ICD-10-CM | POA: Diagnosis not present

## 2022-09-03 DIAGNOSIS — M65861 Other synovitis and tenosynovitis, right lower leg: Secondary | ICD-10-CM | POA: Diagnosis not present

## 2022-10-13 DIAGNOSIS — M7712 Lateral epicondylitis, left elbow: Secondary | ICD-10-CM | POA: Diagnosis not present

## 2022-10-13 DIAGNOSIS — M9901 Segmental and somatic dysfunction of cervical region: Secondary | ICD-10-CM | POA: Diagnosis not present

## 2022-10-13 DIAGNOSIS — M9906 Segmental and somatic dysfunction of lower extremity: Secondary | ICD-10-CM | POA: Diagnosis not present

## 2022-10-13 DIAGNOSIS — M9903 Segmental and somatic dysfunction of lumbar region: Secondary | ICD-10-CM | POA: Diagnosis not present

## 2022-10-15 DIAGNOSIS — M7051 Other bursitis of knee, right knee: Secondary | ICD-10-CM | POA: Diagnosis not present

## 2022-10-15 DIAGNOSIS — Z9889 Other specified postprocedural states: Secondary | ICD-10-CM | POA: Diagnosis not present

## 2022-10-22 DIAGNOSIS — M9906 Segmental and somatic dysfunction of lower extremity: Secondary | ICD-10-CM | POA: Diagnosis not present

## 2022-10-22 DIAGNOSIS — M9901 Segmental and somatic dysfunction of cervical region: Secondary | ICD-10-CM | POA: Diagnosis not present

## 2022-10-22 DIAGNOSIS — M7712 Lateral epicondylitis, left elbow: Secondary | ICD-10-CM | POA: Diagnosis not present

## 2022-10-22 DIAGNOSIS — M9903 Segmental and somatic dysfunction of lumbar region: Secondary | ICD-10-CM | POA: Diagnosis not present

## 2022-10-29 DIAGNOSIS — M9903 Segmental and somatic dysfunction of lumbar region: Secondary | ICD-10-CM | POA: Diagnosis not present

## 2022-10-29 DIAGNOSIS — M7712 Lateral epicondylitis, left elbow: Secondary | ICD-10-CM | POA: Diagnosis not present

## 2022-10-29 DIAGNOSIS — M9906 Segmental and somatic dysfunction of lower extremity: Secondary | ICD-10-CM | POA: Diagnosis not present

## 2022-10-29 DIAGNOSIS — M9901 Segmental and somatic dysfunction of cervical region: Secondary | ICD-10-CM | POA: Diagnosis not present

## 2022-11-13 DIAGNOSIS — M7712 Lateral epicondylitis, left elbow: Secondary | ICD-10-CM | POA: Diagnosis not present

## 2022-11-13 DIAGNOSIS — M9901 Segmental and somatic dysfunction of cervical region: Secondary | ICD-10-CM | POA: Diagnosis not present

## 2022-11-13 DIAGNOSIS — M9906 Segmental and somatic dysfunction of lower extremity: Secondary | ICD-10-CM | POA: Diagnosis not present

## 2022-11-13 DIAGNOSIS — M9903 Segmental and somatic dysfunction of lumbar region: Secondary | ICD-10-CM | POA: Diagnosis not present

## 2022-11-15 NOTE — Assessment & Plan Note (Signed)
hgba1c acceptable, minimize simple carbs. Increase exercise as tolerated.  

## 2022-11-15 NOTE — Assessment & Plan Note (Signed)
Patient encouraged to maintain heart healthy diet, regular exercise, adequate sleep. Consider daily probiotics. Take medications as prescribed. Labs ordered and reviewed  Colonoscopy 2022 [facility] July 2023 repeat next year Pap Shingrix is the new shingles shot, 2 shots over 2-6 months, confirm coverage with insurance and document, then can return here for shots with nurse appt or at pharmacy  Consider flu and covid boosters

## 2022-11-15 NOTE — Assessment & Plan Note (Signed)
Supplement and monitor 

## 2022-11-15 NOTE — Assessment & Plan Note (Signed)
Encouraged good sleep hygiene such as dark, quiet room. No blue/green glowing lights such as computer screens in bedroom. No alcohol or stimulants in evening. Cut down on caffeine as able. Regular exercise is helpful but not just prior to bed time.  Allowed refills on Alprazolam and Zolpidem which she takes at separate times and uses sparingly

## 2022-11-16 ENCOUNTER — Ambulatory Visit (INDEPENDENT_AMBULATORY_CARE_PROVIDER_SITE_OTHER): Payer: BC Managed Care – PPO | Admitting: Family Medicine

## 2022-11-16 ENCOUNTER — Encounter: Payer: Self-pay | Admitting: Family Medicine

## 2022-11-16 VITALS — BP 130/78 | HR 62 | Temp 98.0°F | Resp 16 | Ht 67.0 in | Wt 185.2 lb

## 2022-11-16 DIAGNOSIS — E559 Vitamin D deficiency, unspecified: Secondary | ICD-10-CM | POA: Diagnosis not present

## 2022-11-16 DIAGNOSIS — Z23 Encounter for immunization: Secondary | ICD-10-CM | POA: Diagnosis not present

## 2022-11-16 DIAGNOSIS — G4700 Insomnia, unspecified: Secondary | ICD-10-CM

## 2022-11-16 DIAGNOSIS — R739 Hyperglycemia, unspecified: Secondary | ICD-10-CM

## 2022-11-16 DIAGNOSIS — M545 Low back pain, unspecified: Secondary | ICD-10-CM

## 2022-11-16 DIAGNOSIS — K219 Gastro-esophageal reflux disease without esophagitis: Secondary | ICD-10-CM

## 2022-11-16 DIAGNOSIS — Z0000 Encounter for general adult medical examination without abnormal findings: Secondary | ICD-10-CM | POA: Diagnosis not present

## 2022-11-16 DIAGNOSIS — D649 Anemia, unspecified: Secondary | ICD-10-CM

## 2022-11-16 MED ORDER — ZOLPIDEM TARTRATE 10 MG PO TABS: 10.0000 mg | ORAL_TABLET | Freq: Every evening | ORAL | 3 refills | Status: DC | PRN

## 2022-11-16 MED ORDER — ESCITALOPRAM OXALATE 20 MG PO TABS: 20.0000 mg | ORAL_TABLET | Freq: Every day | ORAL | 1 refills | Status: DC

## 2022-11-16 MED ORDER — ALPRAZOLAM 0.25 MG PO TABS: 0.2500 mg | ORAL_TABLET | Freq: Two times a day (BID) | ORAL | 3 refills | Status: DC | PRN

## 2022-11-16 NOTE — Progress Notes (Signed)
Subjective:   By signing my name below, I, Samantha Morales, attest that this documentation has been prepared under the direction and in the presence of Willette Alma MD, 11/16/2022    Patient ID: Samantha Morales, female    DOB: 10-28-1971, 51 y.o.   MRN: [id number]  Chief Complaint  Patient presents with   Annual Exam    Annual exam     HPI Patient is in today for a comprehensive physical exam  Refill She is requesting a refill of 20 mg of Lexapro  Lower Back Pain She states that her symptoms are persistent. When she stands and lays down, symptoms subside. She is currently seeing a chiropractor and has been seeing them for about 3 visits. She is currently experiencing menopause.   Sleep She reports that her sleep symptoms are persistent She did not take the prescribed 50 mg of Trazodone because she responds well to her previous 0.25 mg of Xanax medication. She takes her 10 mg of Ambien medication as back up if she cannot fall asleep in about 2 hours.   She denies having any fever, new muscle pain, hearing or vision changes, joint pain , new moles, congestion, sinus pain, sore throat, chest pain, palpations, cough, SOB ,wheezing,n/v/d constipation, blood in stool, dysuria, frequency, hematuria, at this time  She denies of any changes to her family medical history. She has not established a power of attorney. Colonoscopy was last completed on 05/27/2021. She is informed to repeat in 2025.  Pap Smear was last completed on 07/02/2022. She is currently seeing [facility].  Mammogram was last completed on 07/10/2022 She is not UTD on the covid or influenza vaccines. She is experiencing some congestion symptoms and unsure about whether to wait to receive the influenza vaccine. She is interested in receiving the influenza vaccine during today's visit.   She is UTD on the dental exams  Past Medical History:  Diagnosis Date   Acute upper respiratory infection 10/26/2016   Allergy     Anemia 03/13/2015   ANEMIA, HX OF 01/20/2011   Anxiety    Atypical chest pain 04/15/2012   Back pain 07/22/2012   Cellulitis of ankle 05/12/2011   CHICKENPOX, HX OF 01/20/2011   Elevated BP 04/15/2012   Epistaxis 10/26/2016   GERD 01/20/2011   Hyperglycemia 05/10/2016   Insomnia 12/12/2012   LACERATION OF FINGER 01/27/2011   Overweight 01/19/2016   Preventative health care 02/28/2014   Sacroiliac joint pain 07/22/2012   Sinusitis, acute 12/12/2012   Tobacco abuse 05/12/2011   Tobacco abuse, in remission 05/12/2011    Past Surgical History:  Procedure Laterality Date   COLONOSCOPY     x2 in 20's & 30's - for hemorrhoids   lasik eye surgery X b/l     WISDOM TOOTH EXTRACTION     x4    Family History  Problem Relation Age of Onset   Hypertension Mother    Arrhythmia Mother    Lupus Sister    Hypertension Brother    Epilepsy Brother    Cancer Maternal Grandmother        breast   Alzheimer's disease Maternal Grandmother    Aneurysm Maternal Grandfather        brain   Arthritis Maternal Grandfather        rheumatoid   Nephrolithiasis Paternal Grandmother    Heart attack Paternal Grandfather    Anemia Brother    Neuromuscular disorder Neg Hx    Colon cancer Neg Hx  Esophageal cancer Neg Hx    Rectal cancer Neg Hx    Stomach cancer Neg Hx     Social History   Socioeconomic History   Marital status: Married    Spouse name: Not on file   Number of children: Not on file   Years of education: Not on file   Highest education level: Not on file  Occupational History   Not on file  Tobacco Use   Smoking status: Some Days    Packs/day: 0.50    Types: Cigarettes    Last attempt to quit: 04/12/2012    Years since quitting: 10.6   Smokeless tobacco: Never   Tobacco comments:    smokes when social  Vaping Use   Vaping Use: Never used  Substance and Sexual Activity   Alcohol use: Yes    Alcohol/week: 0.0 standard drinks of alcohol    Comment: socially   Drug use: No    Sexual activity: Yes    Partners: Male    Comment: no dietary, lives with husband, wears seatbelt  Other Topics Concern   Not on file  Social History Narrative   Not on file   Social Determinants of Health   Financial Resource Strain: Not on file  Food Insecurity: Not on file  Transportation Needs: Not on file  Physical Activity: Not on file  Stress: Not on file  Social Connections: Not on file  Intimate Partner Violence: Not on file    Outpatient Medications Prior to Visit  Medication Sig Dispense Refill   acetaminophen (TYLENOL) 325 MG tablet Take 650 mg by mouth every 6 (six) hours as needed.     Ascorbic Acid (VITAMIN C) 1000 MG tablet Take 1,000 mg by mouth daily.     Cholecalciferol (VITAMIN D3 PO) Take by mouth.     Cyanocobalamin (VITAMIN B-12 PO) Take by mouth.     estradiol (ESTRACE) 0.1 MG/GM vaginal cream Place 1 Applicatorful vaginally 2 (two) times a week. 42.5 g 2   omeprazole (PRILOSEC) 20 MG capsule 90TAKE 1 CAPSULE BY MOUTH EVERY DAY 90 capsule 1   TURMERIC PO Take 1,000 mg by mouth daily.     zinc gluconate 50 MG tablet Take 50 mg by mouth daily.     escitalopram (LEXAPRO) 20 MG tablet Take 1 tablet (20 mg total) by mouth daily. 90 tablet 1   scopolamine (TRANSDERM-SCOP) 1 MG/3DAYS Place 1 patch (1.5 mg total) onto the skin every 3 (three) days. 3 patch 0   traZODone (DESYREL) 50 MG tablet TAKE 0.5-1 TABLETS BY MOUTH AT BEDTIME AS NEEDED FOR SLEEP. (Patient not taking: Reported on 11/16/2022) 90 tablet 2   No facility-administered medications prior to visit.    Allergies  Allergen Reactions   Erythromycin     REACTION: rash   Penicillins     REACTION: swelling, rash   Sulfonamide Derivatives     REACTION: GI upset    Review of Systems  Constitutional:  Negative for fever.  HENT:  Negative for congestion, hearing loss, sinus pain and sore throat.   Eyes:  Negative for blurred vision.  Respiratory:  Negative for cough, shortness of breath and  wheezing.   Cardiovascular:  Negative for chest pain and palpitations.  Gastrointestinal:  Negative for blood in stool, constipation, diarrhea, nausea and vomiting.  Genitourinary:  Negative for dysuria, frequency and hematuria.  Musculoskeletal:  Negative for joint pain and myalgias.  Skin:        (-) New Moles  Objective:    Physical Exam Constitutional:      General: She is not in acute distress.    Appearance: Normal appearance. She is not ill-appearing.  HENT:     Head: Normocephalic and atraumatic.     Right Ear: External ear normal.     Left Ear: External ear normal.  Eyes:     Extraocular Movements: Extraocular movements intact.     Conjunctiva/sclera: Conjunctivae normal.     Pupils: Pupils are equal, round, and reactive to light.  Cardiovascular:     Rate and Rhythm: Normal rate and regular rhythm.     Heart sounds: Normal heart sounds. No murmur heard.    No gallop.  Pulmonary:     Effort: Pulmonary effort is normal. No respiratory distress.     Breath sounds: Normal breath sounds. No wheezing or rales.  Abdominal:     General: Bowel sounds are normal. There is no distension.     Tenderness: There is no abdominal tenderness. There is no guarding.  Musculoskeletal:     Right lower leg: No edema.     Left lower leg: No edema.  Skin:    General: Skin is warm and dry.  Neurological:     Mental Status: She is alert and oriented to person, place, and time.     Deep Tendon Reflexes:     Reflex Scores:      Patellar reflexes are 3+ on the right side and 3+ on the left side. Psychiatric:        Judgment: Judgment normal.     BP 130/78 (BP Location: Right Arm, Patient Position: Sitting, Cuff Size: Normal)   Pulse 62   Temp 98 F (36.7 C) (Oral)   Resp 16   Ht '5\' 7"'$  (1.702 m)   Wt 185 lb 3.2 oz (84 kg)   SpO2 97%   BMI 29.01 kg/m  Wt Readings from Last 3 Encounters:  11/16/22 185 lb 3.2 oz (84 kg)  05/05/22 183 lb 6.4 oz (83.2 kg)  02/07/22 180 lb  (81.6 kg)    Diabetic Foot Exam - Simple   No data filed    Lab Results  Component Value Date   WBC 6.0 05/05/2022   HGB 13.1 05/05/2022   HCT 39.3 05/05/2022   PLT 232.0 05/05/2022   GLUCOSE 79 05/05/2022   CHOL 203 (H) 05/05/2022   TRIG 92.0 05/05/2022   HDL 112.10 05/05/2022   LDLCALC 72 05/05/2022   ALT 15 05/05/2022   AST 17 05/05/2022   NA 136 05/05/2022   K 4.4 05/05/2022   CL 101 05/05/2022   CREATININE 0.68 05/05/2022   BUN 12 05/05/2022   CO2 26 05/05/2022   TSH 0.96 05/05/2022   INR 1.0 12/05/2015   HGBA1C 5.5 05/05/2022    Lab Results  Component Value Date   TSH 0.96 05/05/2022   Lab Results  Component Value Date   WBC 6.0 05/05/2022   HGB 13.1 05/05/2022   HCT 39.3 05/05/2022   MCV 99.9 05/05/2022   PLT 232.0 05/05/2022   Lab Results  Component Value Date   NA 136 05/05/2022   K 4.4 05/05/2022   CO2 26 05/05/2022   GLUCOSE 79 05/05/2022   BUN 12 05/05/2022   CREATININE 0.68 05/05/2022   BILITOT 0.8 05/05/2022   ALKPHOS 83 05/05/2022   AST 17 05/05/2022   ALT 15 05/05/2022   PROT 7.4 05/05/2022   ALBUMIN 4.6 05/05/2022   CALCIUM 9.5 05/05/2022   ANIONGAP  6 02/07/2022   GFR 101.22 05/05/2022   Lab Results  Component Value Date   CHOL 203 (H) 05/05/2022   Lab Results  Component Value Date   HDL 112.10 05/05/2022   Lab Results  Component Value Date   LDLCALC 72 05/05/2022   Lab Results  Component Value Date   TRIG 92.0 05/05/2022   Lab Results  Component Value Date   CHOLHDL 2 05/05/2022   Lab Results  Component Value Date   HGBA1C 5.5 05/05/2022       Assessment & Plan:   Problem List Items Addressed This Visit     GERD   Relevant Orders   Lipid panel   Low back pain    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 May try NSAIDs and prescription meds as directed and report if symptoms worsen or seek immediate care       Insomnia    Encouraged good sleep hygiene such as  dark, quiet room. No blue/green glowing lights such as computer screens in bedroom. No alcohol or stimulants in evening. Cut down on caffeine as able. Regular exercise is helpful but not just prior to bed time.  Allowed refills on Alprazolam and Zolpidem which she takes at separate times and uses sparingly       Preventative health care    Patient encouraged to maintain heart healthy diet, regular exercise, adequate sleep. Consider daily probiotics. Take medications as prescribed. Labs ordered and reviewed  Colonoscopy 2022 repeat in 2025  [facility] July 2023 repeat next year Pap July 2023 [facility] requested records Shingrix is the new shingles shot, 2 shots over 2-6 months, confirm coverage with insurance and document, then can return here for shots with nurse appt or at pharmacy  Consider flu and covid boosters      Vitamin D deficiency    Supplement and monitor       Relevant Orders   VITAMIN D 25 Hydroxy (Vit-D Deficiency, Fractures)   Anemia   Relevant Orders   CBC   Hyperglycemia - Primary    hgba1c acceptable, minimize simple carbs. Increase exercise as tolerated.       Relevant Orders   Comprehensive metabolic panel   Lipid panel   TSH   Hemoglobin A1c   Other Visit Diagnoses     Need for influenza vaccination       Relevant Orders   Flu Vaccine QUAD 6+ mos PF IM (Fluarix Quad PF) (Completed)      Meds ordered this encounter  Medications   zolpidem (AMBIEN) 10 MG tablet    Sig: Take 1 tablet (10 mg total) by mouth at bedtime as needed for sleep.    Dispense:  30 tablet    Refill:  3   ALPRAZolam (XANAX) 0.25 MG tablet    Sig: Take 1 tablet (0.25 mg total) by mouth 2 (two) times daily as needed for anxiety or sleep.    Dispense:  60 tablet    Refill:  3   escitalopram (LEXAPRO) 20 MG tablet    Sig: Take 1 tablet (20 mg total) by mouth daily.    Dispense:  90 tablet    Refill:  1    I, Samantha Homans, MD, personally preformed the services described in  this documentation.  All medical record entries made by the scribe were at my direction and in my presence.  I have reviewed the chart and discharge instructions (if applicable) and agree that  the record reflects my personal performance and is accurate and complete. 11/16/2022   I,Samantha Morales,acting as a scribe for Samantha Homans, MD.,have documented all relevant documentation on the behalf of Samantha Homans, MD,as directed by  Samantha Homans, MD while in the presence of Samantha Homans, MD.    Samantha Homans, MD

## 2022-11-16 NOTE — Patient Instructions (Addendum)
Shingrix is the new shingles shot, 2 shots over 2-6 months, confirm coverage with insurance and document, then can return here for shots with nurse appt or at pharmacy   Consider flu shot.   Preventive Care 8-51 Years Old, Female Preventive care refers to lifestyle choices and visits with your health care provider that can promote health and wellness. Preventive care visits are also called wellness exams. What can I expect for my preventive care visit? Counseling Your health care provider may ask you questions about your: Medical history, including: Past medical problems. Family medical history. Pregnancy history. Current health, including: Menstrual cycle. Method of birth control. Emotional well-being. Home life and relationship well-being. Sexual activity and sexual health. Lifestyle, including: Alcohol, nicotine or tobacco, and drug use. Access to firearms. Diet, exercise, and sleep habits. Work and work Statistician. Sunscreen use. Safety issues such as seatbelt and bike helmet use. Physical exam Your health care provider will check your: Height and weight. These may be used to calculate your BMI (body mass index). BMI is a measurement that tells if you are at a healthy weight. Waist circumference. This measures the distance around your waistline. This measurement also tells if you are at a healthy weight and may help predict your risk of certain diseases, such as type 2 diabetes and high blood pressure. Heart rate and blood pressure. Body temperature. Skin for abnormal spots. What immunizations do I need?  Vaccines are usually given at various ages, according to a schedule. Your health care provider will recommend vaccines for you based on your age, medical history, and lifestyle or other factors, such as travel or where you work. What tests do I need? Screening Your health care provider may recommend screening tests for certain conditions. This may include: Lipid and  cholesterol levels. Diabetes screening. This is done by checking your blood sugar (glucose) after you have not eaten for a while (fasting). Pelvic exam and Pap test. Hepatitis B test. Hepatitis C test. HIV (human immunodeficiency virus) test. STI (sexually transmitted infection) testing, if you are at risk. Lung cancer screening. Colorectal cancer screening. Mammogram. Talk with your health care provider about when you should start having regular mammograms. This may depend on whether you have a family history of breast cancer. BRCA-related cancer screening. This may be done if you have a family history of breast, ovarian, tubal, or peritoneal cancers. Bone density scan. This is done to screen for osteoporosis. Talk with your health care provider about your test results, treatment options, and if necessary, the need for more tests. Follow these instructions at home: Eating and drinking  Eat a diet that includes fresh fruits and vegetables, whole grains, lean protein, and low-fat dairy products. Take vitamin and mineral supplements as recommended by your health care provider. Do not drink alcohol if: Your health care provider tells you not to drink. You are pregnant, may be pregnant, or are planning to become pregnant. If you drink alcohol: Limit how much you have to 0-1 drink a day. Know how much alcohol is in your drink. In the [place] one drink equals one 12 oz bottle of beer (355 mL), one 5 oz glass of wine (148 mL), or one 1 oz glass of hard liquor (44 mL). Lifestyle Brush your teeth every morning and night with fluoride toothpaste. Floss one time each day. Exercise for at least 30 minutes 5 or more days each week. Do not use any products that contain nicotine or tobacco. These products include cigarettes, chewing tobacco, and  vaping devices, such as e-cigarettes. If you need help quitting, ask your health care provider. Do not use drugs. If you are sexually active, practice safe sex.  Use a condom or other form of protection to prevent STIs. If you do not wish to become pregnant, use a form of birth control. If you plan to become pregnant, see your health care provider for a prepregnancy visit. Take aspirin only as told by your health care provider. Make sure that you understand how much to take and what form to take. Work with your health care provider to find out whether it is safe and beneficial for you to take aspirin daily. Find healthy ways to manage stress, such as: Meditation, yoga, or listening to music. Journaling. Talking to a trusted person. Spending time with friends and family. Minimize exposure to UV radiation to reduce your risk of skin cancer. Safety Always wear your seat belt while driving or riding in a vehicle. Do not drive: If you have been drinking alcohol. Do not ride with someone who has been drinking. When you are tired or distracted. While texting. If you have been using any mind-altering substances or drugs. Wear a helmet and other protective equipment during sports activities. If you have firearms in your house, make sure you follow all gun safety procedures. Seek help if you have been physically or sexually abused. What's next? Visit your health care provider once a year for an annual wellness visit. Ask your health care provider how often you should have your eyes and teeth checked. Stay up to date on all vaccines. This information is not intended to replace advice given to you by your health care provider. Make sure you discuss any questions you have with your health care provider. Document Revised: 06/11/2021 Document Reviewed: 06/11/2021 Elsevier Patient Education  [place].

## 2022-11-16 NOTE — Assessment & Plan Note (Signed)
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 May try NSAIDs and prescription meds as directed and report if symptoms worsen or seek immediate care

## 2022-11-26 DIAGNOSIS — M9901 Segmental and somatic dysfunction of cervical region: Secondary | ICD-10-CM | POA: Diagnosis not present

## 2022-11-26 DIAGNOSIS — M9906 Segmental and somatic dysfunction of lower extremity: Secondary | ICD-10-CM | POA: Diagnosis not present

## 2022-11-26 DIAGNOSIS — M7712 Lateral epicondylitis, left elbow: Secondary | ICD-10-CM | POA: Diagnosis not present

## 2022-11-26 DIAGNOSIS — M9903 Segmental and somatic dysfunction of lumbar region: Secondary | ICD-10-CM | POA: Diagnosis not present

## 2022-12-10 DIAGNOSIS — M9903 Segmental and somatic dysfunction of lumbar region: Secondary | ICD-10-CM | POA: Diagnosis not present

## 2022-12-10 DIAGNOSIS — M9901 Segmental and somatic dysfunction of cervical region: Secondary | ICD-10-CM | POA: Diagnosis not present

## 2022-12-10 DIAGNOSIS — M7712 Lateral epicondylitis, left elbow: Secondary | ICD-10-CM | POA: Diagnosis not present

## 2022-12-10 DIAGNOSIS — M9906 Segmental and somatic dysfunction of lower extremity: Secondary | ICD-10-CM | POA: Diagnosis not present

## 2023-01-06 DIAGNOSIS — M7712 Lateral epicondylitis, left elbow: Secondary | ICD-10-CM | POA: Diagnosis not present

## 2023-01-06 DIAGNOSIS — M9901 Segmental and somatic dysfunction of cervical region: Secondary | ICD-10-CM | POA: Diagnosis not present

## 2023-01-06 DIAGNOSIS — M9906 Segmental and somatic dysfunction of lower extremity: Secondary | ICD-10-CM | POA: Diagnosis not present

## 2023-01-06 DIAGNOSIS — M9903 Segmental and somatic dysfunction of lumbar region: Secondary | ICD-10-CM | POA: Diagnosis not present

## 2023-01-07 DIAGNOSIS — J209 Acute bronchitis, unspecified: Secondary | ICD-10-CM | POA: Diagnosis not present

## 2023-01-07 DIAGNOSIS — J019 Acute sinusitis, unspecified: Secondary | ICD-10-CM | POA: Diagnosis not present

## 2023-01-13 DIAGNOSIS — J101 Influenza due to other identified influenza virus with other respiratory manifestations: Secondary | ICD-10-CM | POA: Diagnosis not present

## 2023-01-18 DIAGNOSIS — Z889 Allergy status to unspecified drugs, medicaments and biological substances status: Secondary | ICD-10-CM | POA: Diagnosis not present

## 2023-01-21 DIAGNOSIS — M9901 Segmental and somatic dysfunction of cervical region: Secondary | ICD-10-CM | POA: Diagnosis not present

## 2023-01-21 DIAGNOSIS — M9906 Segmental and somatic dysfunction of lower extremity: Secondary | ICD-10-CM | POA: Diagnosis not present

## 2023-01-21 DIAGNOSIS — M7712 Lateral epicondylitis, left elbow: Secondary | ICD-10-CM | POA: Diagnosis not present

## 2023-01-21 DIAGNOSIS — M9903 Segmental and somatic dysfunction of lumbar region: Secondary | ICD-10-CM | POA: Diagnosis not present

## 2023-02-10 DIAGNOSIS — M9906 Segmental and somatic dysfunction of lower extremity: Secondary | ICD-10-CM | POA: Diagnosis not present

## 2023-02-10 DIAGNOSIS — M9901 Segmental and somatic dysfunction of cervical region: Secondary | ICD-10-CM | POA: Diagnosis not present

## 2023-02-10 DIAGNOSIS — M7712 Lateral epicondylitis, left elbow: Secondary | ICD-10-CM | POA: Diagnosis not present

## 2023-02-10 DIAGNOSIS — M9903 Segmental and somatic dysfunction of lumbar region: Secondary | ICD-10-CM | POA: Diagnosis not present

## 2023-02-24 DIAGNOSIS — M9901 Segmental and somatic dysfunction of cervical region: Secondary | ICD-10-CM | POA: Diagnosis not present

## 2023-02-24 DIAGNOSIS — M7712 Lateral epicondylitis, left elbow: Secondary | ICD-10-CM | POA: Diagnosis not present

## 2023-02-24 DIAGNOSIS — M9906 Segmental and somatic dysfunction of lower extremity: Secondary | ICD-10-CM | POA: Diagnosis not present

## 2023-02-24 DIAGNOSIS — M9903 Segmental and somatic dysfunction of lumbar region: Secondary | ICD-10-CM | POA: Diagnosis not present

## 2023-03-16 DIAGNOSIS — M9901 Segmental and somatic dysfunction of cervical region: Secondary | ICD-10-CM | POA: Diagnosis not present

## 2023-03-16 DIAGNOSIS — M7712 Lateral epicondylitis, left elbow: Secondary | ICD-10-CM | POA: Diagnosis not present

## 2023-03-16 DIAGNOSIS — M9903 Segmental and somatic dysfunction of lumbar region: Secondary | ICD-10-CM | POA: Diagnosis not present

## 2023-03-16 DIAGNOSIS — M9906 Segmental and somatic dysfunction of lower extremity: Secondary | ICD-10-CM | POA: Diagnosis not present

## 2023-04-15 DIAGNOSIS — M9906 Segmental and somatic dysfunction of lower extremity: Secondary | ICD-10-CM | POA: Diagnosis not present

## 2023-04-15 DIAGNOSIS — M7712 Lateral epicondylitis, left elbow: Secondary | ICD-10-CM | POA: Diagnosis not present

## 2023-04-15 DIAGNOSIS — M9903 Segmental and somatic dysfunction of lumbar region: Secondary | ICD-10-CM | POA: Diagnosis not present

## 2023-04-15 DIAGNOSIS — M9901 Segmental and somatic dysfunction of cervical region: Secondary | ICD-10-CM | POA: Diagnosis not present

## 2023-05-06 DIAGNOSIS — M9903 Segmental and somatic dysfunction of lumbar region: Secondary | ICD-10-CM | POA: Diagnosis not present

## 2023-05-06 DIAGNOSIS — M9906 Segmental and somatic dysfunction of lower extremity: Secondary | ICD-10-CM | POA: Diagnosis not present

## 2023-05-06 DIAGNOSIS — M7712 Lateral epicondylitis, left elbow: Secondary | ICD-10-CM | POA: Diagnosis not present

## 2023-05-06 DIAGNOSIS — M9901 Segmental and somatic dysfunction of cervical region: Secondary | ICD-10-CM | POA: Diagnosis not present

## 2023-05-16 NOTE — Assessment & Plan Note (Signed)
Supplement and monitor 

## 2023-05-16 NOTE — Assessment & Plan Note (Signed)
hgba1c acceptable, minimize simple carbs. Increase exercise as tolerated.  

## 2023-05-16 NOTE — Assessment & Plan Note (Signed)
On Escitalopram and uses Alprazolam prn sparingly

## 2023-05-16 NOTE — Assessment & Plan Note (Signed)
Encouraged good sleep hygiene such as dark, quiet room. No blue/green glowing lights such as computer screens in bedroom. No alcohol or stimulants in evening. Cut down on caffeine as able. Regular exercise is helpful but not just prior to bed time.  Zolpidem prn

## 2023-05-17 ENCOUNTER — Ambulatory Visit: Payer: BC Managed Care – PPO | Admitting: Family Medicine

## 2023-05-17 VITALS — BP 122/74 | HR 65 | Temp 98.1°F | Resp 16 | Ht 67.0 in | Wt 159.8 lb

## 2023-05-17 DIAGNOSIS — R739 Hyperglycemia, unspecified: Secondary | ICD-10-CM | POA: Diagnosis not present

## 2023-05-17 DIAGNOSIS — E559 Vitamin D deficiency, unspecified: Secondary | ICD-10-CM

## 2023-05-17 DIAGNOSIS — J019 Acute sinusitis, unspecified: Secondary | ICD-10-CM

## 2023-05-17 DIAGNOSIS — F418 Other specified anxiety disorders: Secondary | ICD-10-CM

## 2023-05-17 DIAGNOSIS — E663 Overweight: Secondary | ICD-10-CM

## 2023-05-17 DIAGNOSIS — G4700 Insomnia, unspecified: Secondary | ICD-10-CM

## 2023-05-17 DIAGNOSIS — E785 Hyperlipidemia, unspecified: Secondary | ICD-10-CM | POA: Diagnosis not present

## 2023-05-17 LAB — LIPID PANEL
Cholesterol: 189 mg/dL (ref 0–200)
HDL: 90.2 mg/dL (ref >39.00)
LDL Cholesterol: 88 mg/dL (ref 0–99)
NonHDL: 98.33
Total CHOL/HDL Ratio: 2
Triglycerides: 52 mg/dL (ref 0.0–149.0)
VLDL: 10.4 mg/dL (ref 0.0–40.0)

## 2023-05-17 LAB — CBC WITH DIFFERENTIAL/PLATELET
Basophils Absolute: 0.1 10*3/uL (ref 0.0–0.1)
Basophils Relative: 0.9 % (ref 0.0–3.0)
Eosinophils Absolute: 0.3 10*3/uL (ref 0.0–0.7)
Eosinophils Relative: 3.6 % (ref 0.0–5.0)
HCT: 40.7 % (ref 36.0–46.0)
Hemoglobin: 13.8 g/dL (ref 12.0–15.0)
Lymphocytes Relative: 18.7 % (ref 12.0–46.0)
Lymphs Abs: 1.5 10*3/uL (ref 0.7–4.0)
MCHC: 33.8 g/dL (ref 30.0–36.0)
MCV: 101.7 fl — ABNORMAL HIGH (ref 78.0–100.0)
Monocytes Absolute: 0.6 10*3/uL (ref 0.1–1.0)
Monocytes Relative: 8.2 % (ref 3.0–12.0)
Neutro Abs: 5.4 10*3/uL (ref 1.4–7.7)
Neutrophils Relative %: 68.6 % (ref 43.0–77.0)
Platelets: 251 10*3/uL (ref 150.0–400.0)
RBC: 4.01 Mil/uL (ref 3.87–5.11)
RDW: 13.9 % (ref 11.5–15.5)
WBC: 7.8 10*3/uL (ref 4.0–10.5)

## 2023-05-17 LAB — COMPREHENSIVE METABOLIC PANEL
ALT: 17 U/L (ref 0–35)
AST: 19 U/L (ref 0–37)
Albumin: 4.4 g/dL (ref 3.5–5.2)
Alkaline Phosphatase: 100 U/L (ref 39–117)
BUN: 12 mg/dL (ref 6–23)
CO2: 30 mEq/L (ref 19–32)
Calcium: 9.8 mg/dL (ref 8.4–10.5)
Chloride: 102 mEq/L (ref 96–112)
Creatinine, Ser: 0.62 mg/dL (ref 0.40–1.20)
GFR: 102.75 mL/min (ref >60.00)
Glucose, Bld: 87 mg/dL (ref 70–99)
Potassium: 5.1 mEq/L (ref 3.5–5.1)
Sodium: 141 mEq/L (ref 135–145)
Total Bilirubin: 0.7 mg/dL (ref 0.2–1.2)
Total Protein: 7.1 g/dL (ref 6.0–8.3)

## 2023-05-17 LAB — POCT RAPID STREP A (OFFICE): Rapid Strep A Screen: NEGATIVE

## 2023-05-17 LAB — POC COVID19 BINAXNOW: SARS Coronavirus 2 Ag: NEGATIVE

## 2023-05-17 LAB — HEMOGLOBIN A1C: Hgb A1c MFr Bld: 5.3 % (ref 4.6–6.5)

## 2023-05-17 MED ORDER — DOXYCYCLINE HYCLATE 50 MG PO CAPS: 50.0000 mg | ORAL_CAPSULE | Freq: Two times a day (BID) | ORAL | 0 refills | Status: DC

## 2023-05-17 NOTE — Assessment & Plan Note (Signed)
Encourage heart healthy diet such as MIND or DASH diet, increase exercise, avoid trans fats, simple carbohydrates and processed foods, consider a krill or fish or flaxseed oil cap daily.  °

## 2023-05-17 NOTE — Patient Instructions (Signed)

## 2023-05-17 NOTE — Progress Notes (Signed)
Subjective:    Patient ID: Samantha Morales, female    DOB: [birth year], 52 y.o.   MRN: [id number]  Chief Complaint  Patient presents with  . Follow-up    Follow up    HPI Patient is in today for ***  Past Medical History:  Diagnosis Date  . Acute upper respiratory infection 10/26/2016  . Allergy   . Anemia 03/13/2015  . ANEMIA, HX OF 01/20/2011  . Anxiety   . Atypical chest pain 04/15/2012  . Back pain 07/22/2012  . Cellulitis of ankle 05/12/2011  . CHICKENPOX, HX OF 01/20/2011  . Elevated BP 04/15/2012  . Epistaxis 10/26/2016  . GERD 01/20/2011  . Hyperglycemia 05/10/2016  . Insomnia 12/12/2012  . LACERATION OF FINGER 01/27/2011  . Overweight 01/19/2016  . Preventative health care 02/28/2014  . Sacroiliac joint pain 07/22/2012  . Sinusitis, acute 12/12/2012  . Tobacco abuse 05/12/2011  . 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 COLONOSCOPY     x2 in 20's & 30's - for hemorrhoids  . lasik eye surgery X b/l    . WISDOM TOOTH EXTRACTION     x4    Family History  Problem Relation Age of Onset  . Hypertension Mother   . Arrhythmia Mother   . Lupus Sister   . Hypertension Brother   . Epilepsy Brother   . Cancer Maternal Grandmother        breast  . Alzheimer's disease Maternal Grandmother   . Aneurysm Maternal Grandfather        brain  . Arthritis Maternal Grandfather        rheumatoid  . Nephrolithiasis Paternal Grandmother   . Heart attack Paternal Grandfather   . Anemia Brother   . Neuromuscular disorder Neg Hx   . Colon cancer Neg Hx   . Esophageal cancer Neg Hx   . Rectal cancer Neg Hx   . Stomach cancer Neg Hx     Social History   Socioeconomic History  . Marital status: Married    Spouse name: Not on file  . Number of children: Not on file  . Years of education: Not on file  . Highest education level: Associate degree: occupational, Scientist, product/process development, or vocational program  Occupational History  . Not on  file  Tobacco Use  . Smoking status: Some Days    Packs/day: .5    Types: Cigarettes    Last attempt to quit: 04/12/2012    Years since quitting: 11.1  . Smokeless tobacco: Never  . Tobacco comments:    smokes when social  Vaping Use  . Vaping Use: Never used  Substance and Sexual Activity  . Alcohol use: Yes    Alcohol/week: 0.0 standard drinks of alcohol    Comment: socially  . Drug use: No  . Sexual activity: Yes    Partners: Male    Comment: no dietary, lives with husband, wears seatbelt  Other Topics Concern  . Not on file  Social History Narrative  . Not on file   Social Determinants of Health   Financial Resource Strain: Low Risk  (05/16/2023)   Overall Financial Resource Strain (CARDIA)   . Difficulty of Paying Living Expenses: Not hard at all  Food Insecurity: No Food Insecurity (05/16/2023)   Hunger Vital Sign   . Worried About Programme researcher, broadcasting/film/video in the Last Year: Never true   . Ran Out of Food in the Last Year: Never true  Transportation Needs: No Transportation Needs (05/16/2023)   PRAPARE - Transportation   . Lack of Transportation (Medical): No   . Lack of Transportation (Non-Medical): No  Physical Activity: Insufficiently Active (05/16/2023)   Exercise Vital Sign   . Days of Exercise per Week: 3 days   . Minutes of Exercise per Session: 20 min  Stress: No Stress Concern Present (05/16/2023)   [employer] of Occupational Health - Occupational Stress Questionnaire   . Feeling of Stress : Not at all  Social Connections: Socially Integrated (05/16/2023)   Social Connection and Isolation Panel [NHANES]   . Frequency of Communication with Friends and Family: More than three times a week   . Frequency of Social Gatherings with Friends and Family: Once a week   . Attends Religious Services: More than 4 times per year   . Active Member of Clubs or Organizations: Yes   . Attends Banker Meetings: More than 4 times per year   . Marital Status:  Married  Catering manager Violence: Not on file    Outpatient Medications Prior to Visit  Medication Sig Dispense Refill  . acetaminophen (TYLENOL) 325 MG tablet Take 650 mg by mouth every 6 (six) hours as needed.    . ALPRAZolam (XANAX) 0.25 MG tablet Take 1 tablet (0.25 mg total) by mouth 2 (two) times daily as needed for anxiety or sleep. 60 tablet 3  . Ascorbic Acid (VITAMIN C) 1000 MG tablet Take 1,000 mg by mouth daily.    . Cholecalciferol (VITAMIN D3 PO) Take by mouth.    . Cyanocobalamin (VITAMIN B-12 PO) Take by mouth.    . escitalopram (LEXAPRO) 20 MG tablet Take 1 tablet (20 mg total) by mouth daily. 90 tablet 1  . estradiol (ESTRACE) 0.1 MG/GM vaginal cream Place 1 Applicatorful vaginally 2 (two) times a week. 42.5 g 2  . omeprazole (PRILOSEC) 20 MG capsule 90TAKE 1 CAPSULE BY MOUTH EVERY DAY 90 capsule 1  . TURMERIC PO Take 1,000 mg by mouth daily.    Marland Kitchen zinc gluconate 50 MG tablet Take 50 mg by mouth daily.    Marland Kitchen zolpidem (AMBIEN) 10 MG tablet Take 1 tablet (10 mg total) by mouth at bedtime as needed for sleep. 30 tablet 3   No facility-administered medications prior to visit.    Allergies  Allergen Reactions  . Erythromycin     REACTION: rash  . Penicillins     REACTION: swelling, rash  . Sulfonamide Derivatives     REACTION: GI upset    Review of Systems  Constitutional:  Negative for chills, fever and malaise/fatigue.  HENT:  Positive for congestion, sinus pain and sore throat.   Eyes:  Negative for blurred vision.  Respiratory:  Positive for cough, sputum production and wheezing. Negative for shortness of breath.   Cardiovascular:  Negative for chest pain, palpitations and leg swelling.  Gastrointestinal:  Negative for abdominal pain, blood in stool and nausea.  Genitourinary:  Negative for dysuria and frequency.  Musculoskeletal:  Negative for falls.  Skin:  Negative for rash.  Neurological:  Negative for dizziness, loss of consciousness and headaches.   Endo/Heme/Allergies:  Negative for environmental allergies.  Psychiatric/Behavioral:  Negative for depression. The patient is not nervous/anxious.        Objective:    Physical Exam Constitutional:      General: She is not in acute distress.    Appearance: Normal appearance. She is well-developed. She is not toxic-appearing.  HENT:  Head: Normocephalic and atraumatic.     Right Ear: External ear normal.     Left Ear: External ear normal.     Nose: Nose normal.  Eyes:     General:        Right eye: No discharge.        Left eye: No discharge.     Conjunctiva/sclera: Conjunctivae normal.  Neck:     Thyroid: No thyromegaly.  Cardiovascular:     Rate and Rhythm: Normal rate and regular rhythm.     Heart sounds: Normal heart sounds. No murmur heard. Pulmonary:     Effort: Pulmonary effort is normal. No respiratory distress.     Breath sounds: Normal breath sounds.  Abdominal:     General: Bowel sounds are normal.     Palpations: Abdomen is soft.     Tenderness: There is no abdominal tenderness. There is no guarding.  Musculoskeletal:        General: Normal range of motion.     Cervical back: Neck supple.  Lymphadenopathy:     Cervical: No cervical adenopathy.  Skin:    General: Skin is warm and dry.  Neurological:     Mental Status: She is alert and oriented to person, place, and time.  Psychiatric:        Mood and Affect: Mood normal.        Behavior: Behavior normal.        Thought Content: Thought content normal.        Judgment: Judgment normal.    BP 122/74 (BP Location: Right Arm, Patient Position: Sitting, Cuff Size: Normal)   Pulse 65   Temp 98.1 F (36.7 C) (Oral)   Resp 16   Ht 5\' 7"  (1.702 m)   Wt 159 lb 12.8 oz (72.5 kg)   SpO2 96%   BMI 25.03 kg/m  Wt Readings from Last 3 Encounters:  05/17/23 159 lb 12.8 oz (72.5 kg)  11/16/22 185 lb 3.2 oz (84 kg)  05/05/22 183 lb 6.4 oz (83.2 kg)    Diabetic Foot Exam - Simple   No data filed    Lab  Results  Component Value Date   WBC 6.0 05/05/2022   HGB 13.1 05/05/2022   HCT 39.3 05/05/2022   PLT 232.0 05/05/2022   GLUCOSE 79 05/05/2022   CHOL 203 (H) 05/05/2022   TRIG 92.0 05/05/2022   HDL 112.10 05/05/2022   LDLCALC 72 05/05/2022   ALT 15 05/05/2022   AST 17 05/05/2022   NA 136 05/05/2022   K 4.4 05/05/2022   CL 101 05/05/2022   CREATININE 0.68 05/05/2022   BUN 12 05/05/2022   CO2 26 05/05/2022   TSH 0.96 05/05/2022   INR 1.0 12/05/2015   HGBA1C 5.5 05/05/2022    Lab Results  Component Value Date   TSH 0.96 05/05/2022   Lab Results  Component Value Date   WBC 6.0 05/05/2022   HGB 13.1 05/05/2022   HCT 39.3 05/05/2022   MCV 99.9 05/05/2022   PLT 232.0 05/05/2022   Lab Results  Component Value Date   NA 136 05/05/2022   K 4.4 05/05/2022   CO2 26 05/05/2022   GLUCOSE 79 05/05/2022   BUN 12 05/05/2022   CREATININE 0.68 05/05/2022   BILITOT 0.8 05/05/2022   ALKPHOS 83 05/05/2022   AST 17 05/05/2022   ALT 15 05/05/2022   PROT 7.4 05/05/2022   ALBUMIN 4.6 05/05/2022   CALCIUM 9.5 05/05/2022   ANIONGAP 6 02/07/2022  GFR 101.22 05/05/2022   Lab Results  Component Value Date   CHOL 203 (H) 05/05/2022   Lab Results  Component Value Date   HDL 112.10 05/05/2022   Lab Results  Component Value Date   LDLCALC 72 05/05/2022   Lab Results  Component Value Date   TRIG 92.0 05/05/2022   Lab Results  Component Value Date   CHOLHDL 2 05/05/2022   Lab Results  Component Value Date   HGBA1C 5.5 05/05/2022       Assessment & Plan:  Hyperglycemia Assessment & Plan: hgba1c acceptable, minimize simple carbs. Increase exercise as tolerated.    Insomnia, unspecified type Assessment & Plan: Encouraged good sleep hygiene such as dark, quiet room. No blue/green glowing lights such as computer screens in bedroom. No alcohol or stimulants in evening. Cut down on caffeine as able. Regular exercise is helpful but not just prior to bed time.  Zolpidem  prn   Depression with anxiety Assessment & Plan: On Escitalopram and uses Alprazolam prn sparingly   Vitamin D deficiency Assessment & Plan: Supplement and monitor      Danise Edge, MD

## 2023-05-18 LAB — VITAMIN D 25 HYDROXY (VIT D DEFICIENCY, FRACTURES): VITD: 46.27 ng/mL (ref 30.00–100.00)

## 2023-05-18 LAB — TSH: TSH: 1.49 u[IU]/mL (ref 0.35–5.50)

## 2023-05-18 NOTE — Assessment & Plan Note (Signed)
Ill for past week and a history of recurrent infections. Encouraged plain mucinex bid, Encouraged increased rest and hydration, add probiotics, zinc such as Coldeze or Xicam. Treat fevers as needed but if symptoms continue to worsen is given an rx for Doxycycline to use

## 2023-05-18 NOTE — Assessment & Plan Note (Signed)
She has been following the Weight [employer] platform and she has lost over 10 #s she feels much better. She will continue to follow and stay active

## 2023-05-31 DIAGNOSIS — R091 Pleurisy: Secondary | ICD-10-CM | POA: Diagnosis not present

## 2023-06-02 DIAGNOSIS — M9906 Segmental and somatic dysfunction of lower extremity: Secondary | ICD-10-CM | POA: Diagnosis not present

## 2023-06-02 DIAGNOSIS — M7712 Lateral epicondylitis, left elbow: Secondary | ICD-10-CM | POA: Diagnosis not present

## 2023-06-02 DIAGNOSIS — M9903 Segmental and somatic dysfunction of lumbar region: Secondary | ICD-10-CM | POA: Diagnosis not present

## 2023-06-02 DIAGNOSIS — M9901 Segmental and somatic dysfunction of cervical region: Secondary | ICD-10-CM | POA: Diagnosis not present

## 2023-06-24 DIAGNOSIS — M25561 Pain in right knee: Secondary | ICD-10-CM | POA: Diagnosis not present

## 2023-06-30 DIAGNOSIS — M9901 Segmental and somatic dysfunction of cervical region: Secondary | ICD-10-CM | POA: Diagnosis not present

## 2023-06-30 DIAGNOSIS — M7712 Lateral epicondylitis, left elbow: Secondary | ICD-10-CM | POA: Diagnosis not present

## 2023-06-30 DIAGNOSIS — M9903 Segmental and somatic dysfunction of lumbar region: Secondary | ICD-10-CM | POA: Diagnosis not present

## 2023-06-30 DIAGNOSIS — M9906 Segmental and somatic dysfunction of lower extremity: Secondary | ICD-10-CM | POA: Diagnosis not present

## 2023-07-12 DIAGNOSIS — Z1231 Encounter for screening mammogram for malignant neoplasm of breast: Secondary | ICD-10-CM | POA: Diagnosis not present

## 2023-07-12 DIAGNOSIS — Z01419 Encounter for gynecological examination (general) (routine) without abnormal findings: Secondary | ICD-10-CM | POA: Diagnosis not present

## 2023-07-12 DIAGNOSIS — Z6825 Body mass index (BMI) 25.0-25.9, adult: Secondary | ICD-10-CM | POA: Diagnosis not present

## 2023-07-12 LAB — HM MAMMOGRAPHY

## 2023-08-03 DIAGNOSIS — M9901 Segmental and somatic dysfunction of cervical region: Secondary | ICD-10-CM | POA: Diagnosis not present

## 2023-08-03 DIAGNOSIS — M7712 Lateral epicondylitis, left elbow: Secondary | ICD-10-CM | POA: Diagnosis not present

## 2023-08-03 DIAGNOSIS — M9906 Segmental and somatic dysfunction of lower extremity: Secondary | ICD-10-CM | POA: Diagnosis not present

## 2023-08-03 DIAGNOSIS — M9903 Segmental and somatic dysfunction of lumbar region: Secondary | ICD-10-CM | POA: Diagnosis not present

## 2023-10-01 ENCOUNTER — Other Ambulatory Visit: Payer: Self-pay | Admitting: Family Medicine

## 2023-10-01 NOTE — Telephone Encounter (Signed)
Requesting: alprazolam 0.25mg   Contract: 06/27/2018 UDS: 06/27/2018 Last Visit: 05/17/23 Next Visit:  12/07/23 Last Refill: 11/16/22 #60 and 3RF   Please Advise

## 2023-10-07 DIAGNOSIS — M9903 Segmental and somatic dysfunction of lumbar region: Secondary | ICD-10-CM | POA: Diagnosis not present

## 2023-10-07 DIAGNOSIS — M9901 Segmental and somatic dysfunction of cervical region: Secondary | ICD-10-CM | POA: Diagnosis not present

## 2023-10-07 DIAGNOSIS — M9906 Segmental and somatic dysfunction of lower extremity: Secondary | ICD-10-CM | POA: Diagnosis not present

## 2023-10-07 DIAGNOSIS — M7712 Lateral epicondylitis, left elbow: Secondary | ICD-10-CM | POA: Diagnosis not present

## 2023-11-03 ENCOUNTER — Other Ambulatory Visit: Payer: Self-pay | Admitting: *Deleted

## 2023-11-03 ENCOUNTER — Telehealth: Payer: Self-pay | Admitting: Family Medicine

## 2023-11-03 DIAGNOSIS — M9906 Segmental and somatic dysfunction of lower extremity: Secondary | ICD-10-CM | POA: Diagnosis not present

## 2023-11-03 DIAGNOSIS — M9903 Segmental and somatic dysfunction of lumbar region: Secondary | ICD-10-CM | POA: Diagnosis not present

## 2023-11-03 DIAGNOSIS — M9901 Segmental and somatic dysfunction of cervical region: Secondary | ICD-10-CM | POA: Diagnosis not present

## 2023-11-03 DIAGNOSIS — M7712 Lateral epicondylitis, left elbow: Secondary | ICD-10-CM | POA: Diagnosis not present

## 2023-11-03 MED ORDER — ESCITALOPRAM OXALATE 20 MG PO TABS: 20.0000 mg | ORAL_TABLET | Freq: Every day | ORAL | 0 refills | Status: DC

## 2023-11-03 NOTE — Telephone Encounter (Signed)
Pt called to follow up on refill. Advised it was sent to provider. Pt wanted to find out if she would be okay if she missed a dose since she you shouldn't stop taking it abruptly. Please advise patient if this is okay.

## 2023-11-03 NOTE — Telephone Encounter (Signed)
Refill sent in today but can you answer her question about if she will be ok if she has missed a dose.

## 2023-11-03 NOTE — Telephone Encounter (Signed)
Pt notified and she has picked up medication.

## 2023-11-03 NOTE — Telephone Encounter (Signed)
**  Pt is scheduled for CPE on 12.10.24**  Prescription Request  11/03/2023  Is this a "Controlled Substance" medicine? No  LOV: 05/17/2023  What is the name of the medication or equipment?   escitalopram (LEXAPRO) 20 MG tablet [[id number]]  Have you contacted your pharmacy to request a refill? No   Which pharmacy would you like this sent to?  [facility] - [place], [place] - [address] [place] [address] Phone: [phone] Fax: [phone]    Patient notified that their request is being sent to the clinical staff for review and that they should receive a response within 2 business days.   Please advise at Mobile [phone] (mobile)

## 2023-11-29 ENCOUNTER — Other Ambulatory Visit: Payer: Self-pay | Admitting: Family

## 2023-12-01 DIAGNOSIS — M9906 Segmental and somatic dysfunction of lower extremity: Secondary | ICD-10-CM | POA: Diagnosis not present

## 2023-12-01 DIAGNOSIS — M7712 Lateral epicondylitis, left elbow: Secondary | ICD-10-CM | POA: Diagnosis not present

## 2023-12-01 DIAGNOSIS — M9901 Segmental and somatic dysfunction of cervical region: Secondary | ICD-10-CM | POA: Diagnosis not present

## 2023-12-01 DIAGNOSIS — M9903 Segmental and somatic dysfunction of lumbar region: Secondary | ICD-10-CM | POA: Diagnosis not present

## 2023-12-06 NOTE — Assessment & Plan Note (Signed)
hgba1c acceptable, minimize simple carbs. Increase exercise as tolerated.  

## 2023-12-06 NOTE — Assessment & Plan Note (Signed)
Stable on current meds 

## 2023-12-06 NOTE — Assessment & Plan Note (Signed)
Encourage heart healthy diet such as MIND or DASH diet, increase exercise, avoid trans fats, simple carbohydrates and processed foods, consider a krill or fish or flaxseed oil cap daily.  °

## 2023-12-06 NOTE — Assessment & Plan Note (Signed)
Patient encouraged to maintain heart healthy diet, regular exercise, adequate sleep. Consider daily probiotics. Take medications as prescribed. Labs ordered and reviewed  Colonoscopy 2022 repeat in 2025  [facility] July 2023 repeat every 1-2 years Pap July 2023 [facility] requested records Shingrix is the new shingles shot, 2 shots over 2-6 months, confirm coverage with insurance and document, then can return here for shots with nurse appt or at pharmacy  Consider flu and covid boosters Shingrix is the new shingles shot, 2 shots over 2-6 months, confirm coverage with insurance and document, then can return here for shots with nurse appt or at pharmacy

## 2023-12-06 NOTE — Assessment & Plan Note (Signed)
Encouraged good sleep hygiene such as dark, quiet room. No blue/green glowing lights such as computer screens in bedroom. No alcohol or stimulants in evening. Cut down on caffeine as able. Regular exercise is helpful but not just prior to bed time.  Zolpidem prn

## 2023-12-06 NOTE — Assessment & Plan Note (Signed)
Supplement and monitor 

## 2023-12-07 ENCOUNTER — Ambulatory Visit (INDEPENDENT_AMBULATORY_CARE_PROVIDER_SITE_OTHER): Payer: BC Managed Care – PPO | Admitting: Family Medicine

## 2023-12-07 VITALS — BP 132/80 | HR 62 | Temp 98.2°F | Resp 16 | Ht 67.0 in | Wt 161.4 lb

## 2023-12-07 DIAGNOSIS — R739 Hyperglycemia, unspecified: Secondary | ICD-10-CM

## 2023-12-07 DIAGNOSIS — F418 Other specified anxiety disorders: Secondary | ICD-10-CM

## 2023-12-07 DIAGNOSIS — G4700 Insomnia, unspecified: Secondary | ICD-10-CM | POA: Diagnosis not present

## 2023-12-07 DIAGNOSIS — Z79899 Other long term (current) drug therapy: Secondary | ICD-10-CM | POA: Diagnosis not present

## 2023-12-07 DIAGNOSIS — E785 Hyperlipidemia, unspecified: Secondary | ICD-10-CM

## 2023-12-07 DIAGNOSIS — Z0000 Encounter for general adult medical examination without abnormal findings: Secondary | ICD-10-CM | POA: Diagnosis not present

## 2023-12-07 DIAGNOSIS — Z1159 Encounter for screening for other viral diseases: Secondary | ICD-10-CM

## 2023-12-07 DIAGNOSIS — E559 Vitamin D deficiency, unspecified: Secondary | ICD-10-CM | POA: Diagnosis not present

## 2023-12-07 LAB — LIPID PANEL
Cholesterol: 199 mg/dL (ref 0–200)
HDL: 101.1 mg/dL (ref >39.00)
LDL Cholesterol: 87 mg/dL (ref 0–99)
NonHDL: 98.02
Total CHOL/HDL Ratio: 2
Triglycerides: 53 mg/dL (ref 0.0–149.0)
VLDL: 10.6 mg/dL (ref 0.0–40.0)

## 2023-12-07 LAB — COMPREHENSIVE METABOLIC PANEL
ALT: 14 U/L (ref 0–35)
AST: 19 U/L (ref 0–37)
Albumin: 4.8 g/dL (ref 3.5–5.2)
Alkaline Phosphatase: 87 U/L (ref 39–117)
BUN: 11 mg/dL (ref 6–23)
CO2: 29 meq/L (ref 19–32)
Calcium: 9.8 mg/dL (ref 8.4–10.5)
Chloride: 100 meq/L (ref 96–112)
Creatinine, Ser: 0.65 mg/dL (ref 0.40–1.20)
GFR: 101.19 mL/min (ref >60.00)
Glucose, Bld: 87 mg/dL (ref 70–99)
Potassium: 5 meq/L (ref 3.5–5.1)
Sodium: 137 meq/L (ref 135–145)
Total Bilirubin: 1 mg/dL (ref 0.2–1.2)
Total Protein: 7.4 g/dL (ref 6.0–8.3)

## 2023-12-07 LAB — CBC WITH DIFFERENTIAL/PLATELET
Basophils Absolute: 0.1 10*3/uL (ref 0.0–0.1)
Basophils Relative: 1.2 % (ref 0.0–3.0)
Eosinophils Absolute: 0.3 10*3/uL (ref 0.0–0.7)
Eosinophils Relative: 5.1 % — ABNORMAL HIGH (ref 0.0–5.0)
HCT: 41 % (ref 36.0–46.0)
Hemoglobin: 13.7 g/dL (ref 12.0–15.0)
Lymphocytes Relative: 29.3 % (ref 12.0–46.0)
Lymphs Abs: 1.7 10*3/uL (ref 0.7–4.0)
MCHC: 33.3 g/dL (ref 30.0–36.0)
MCV: 101.3 fL — ABNORMAL HIGH (ref 78.0–100.0)
Monocytes Absolute: 0.6 10*3/uL (ref 0.1–1.0)
Monocytes Relative: 9.9 % (ref 3.0–12.0)
Neutro Abs: 3.1 10*3/uL (ref 1.4–7.7)
Neutrophils Relative %: 54.5 % (ref 43.0–77.0)
Platelets: 272 10*3/uL (ref 150.0–400.0)
RBC: 4.05 Mil/uL (ref 3.87–5.11)
RDW: 13.8 % (ref 11.5–15.5)
WBC: 5.7 10*3/uL (ref 4.0–10.5)

## 2023-12-07 LAB — HEMOGLOBIN A1C: Hgb A1c MFr Bld: 5.5 % (ref 4.6–6.5)

## 2023-12-07 LAB — VITAMIN D 25 HYDROXY (VIT D DEFICIENCY, FRACTURES): VITD: 37.31 ng/mL (ref 30.00–100.00)

## 2023-12-07 LAB — TSH: TSH: 1.34 u[IU]/mL (ref 0.35–5.50)

## 2023-12-07 NOTE — Patient Instructions (Addendum)
Shingrix is the new shingles shot, 2 shots over 2-6 months, confirm coverage with insurance and document, then can return here for shots with nurse appt or at pharmacy  Covid booster annually   Preventive Care 52-52 Years Old, Female Preventive care refers to lifestyle choices and visits with your health care provider that can promote health and wellness. Preventive care visits are also called wellness exams. What can I expect for my preventive care visit? Counseling Your health care provider may ask you questions about your: Medical history, including: Past medical problems. Family medical history. Pregnancy history. Current health, including: Menstrual cycle. Method of birth control. Emotional well-being. Home life and relationship well-being. Sexual activity and sexual health. Lifestyle, including: Alcohol, nicotine or tobacco, and drug use. Access to firearms. Diet, exercise, and sleep habits. Work and work Astronomer. Sunscreen use. Safety issues such as seatbelt and bike helmet use. Physical exam Your health care provider will check your: Height and weight. These may be used to calculate your BMI (body mass index). BMI is a measurement that tells if you are at a healthy weight. Waist circumference. This measures the distance around your waistline. This measurement also tells if you are at a healthy weight and may help predict your risk of certain diseases, such as type 2 diabetes and high blood pressure. Heart rate and blood pressure. Body temperature. Skin for abnormal spots. What immunizations do I need?  Vaccines are usually given at various ages, according to a schedule. Your health care provider will recommend vaccines for you based on your age, medical history, and lifestyle or other factors, such as travel or where you work. What tests do I need? Screening Your health care provider may recommend screening tests for certain conditions. This may include: Lipid and  cholesterol levels. Diabetes screening. This is done by checking your blood sugar (glucose) after you have not eaten for a while (fasting). Pelvic exam and Pap test. Hepatitis B test. Hepatitis C test. HIV (human immunodeficiency virus) test. STI (sexually transmitted infection) testing, if you are at risk. Lung cancer screening. Colorectal cancer screening. Mammogram. Talk with your health care provider about when you should start having regular mammograms. This may depend on whether you have a family history of breast cancer. BRCA-related cancer screening. This may be done if you have a family history of breast, ovarian, tubal, or peritoneal cancers. Bone density scan. This is done to screen for osteoporosis. Talk with your health care provider about your test results, treatment options, and if necessary, the need for more tests. Follow these instructions at home: Eating and drinking  Eat a diet that includes fresh fruits and vegetables, whole grains, lean protein, and low-fat dairy products. Take vitamin and mineral supplements as recommended by your health care provider. Do not drink alcohol if: Your health care provider tells you not to drink. You are pregnant, may be pregnant, or are planning to become pregnant. If you drink alcohol: Limit how much you have to 0-1 drink a day. Know how much alcohol is in your drink. In the [place] one drink equals one 12 oz bottle of beer (355 mL), one 5 oz glass of wine (148 mL), or one 1 oz glass of hard liquor (44 mL). Lifestyle Brush your teeth every morning and night with fluoride toothpaste. Floss one time each day. Exercise for at least 30 minutes 5 or more days each week. Do not use any products that contain nicotine or tobacco. These products include cigarettes, chewing tobacco, and vaping  devices, such as e-cigarettes. If you need help quitting, ask your health care provider. Do not use drugs. If you are sexually active, practice safe sex.  Use a condom or other form of protection to prevent STIs. If you do not wish to become pregnant, use a form of birth control. If you plan to become pregnant, see your health care provider for a prepregnancy visit. Take aspirin only as told by your health care provider. Make sure that you understand how much to take and what form to take. Work with your health care provider to find out whether it is safe and beneficial for you to take aspirin daily. Find healthy ways to manage stress, such as: Meditation, yoga, or listening to music. Journaling. Talking to a trusted person. Spending time with friends and family. Minimize exposure to UV radiation to reduce your risk of skin cancer. Safety Always wear your seat belt while driving or riding in a vehicle. Do not drive: If you have been drinking alcohol. Do not ride with someone who has been drinking. When you are tired or distracted. While texting. If you have been using any mind-altering substances or drugs. Wear a helmet and other protective equipment during sports activities. If you have firearms in your house, make sure you follow all gun safety procedures. Seek help if you have been physically or sexually abused. What's next? Visit your health care provider once a year for an annual wellness visit. Ask your health care provider how often you should have your eyes and teeth checked. Stay up to date on all vaccines. This information is not intended to replace advice given to you by your health care provider. Make sure you discuss any questions you have with your health care provider. Document Revised: 06/11/2021 Document Reviewed: 06/11/2021 Elsevier Patient Education  2024 [employer].

## 2023-12-08 ENCOUNTER — Encounter: Payer: Self-pay | Admitting: Family Medicine

## 2023-12-08 LAB — HEPATITIS C ANTIBODY: Hepatitis C Ab: NONREACTIVE

## 2023-12-08 NOTE — Progress Notes (Signed)
Subjective:    Patient ID: Samantha Morales, female    DOB: [birth year], 52 y.o.   MRN: [id number]  Chief Complaint  Patient presents with   Annual Exam    HPI Discussed the use of AI scribe software for clinical note transcription with the patient, who gave verbal consent to proceed.  History of Present Illness   The patient, with a history of polyps, presents for a routine check-up. They report that they are generally in good health, taking care of themselves by staying active and hydrated. They have been diligent about their dental care, visiting the dentist twice a year. The patient has not had any recent ER visits or chest pains. They have not noticed any changes in their bowel movements or any blood in their stool. The patient has not had any recent changes in their family history. They have not completed their advanced directives, healthcare power of attorney, or living will, but have the paperwork at home. They have also been keeping up with their mammograms, having had one in July of the previous year.        Past Medical History:  Diagnosis Date   Acute upper respiratory infection 10/26/2016   Allergy    Anemia 03/13/2015   ANEMIA, HX OF 01/20/2011   Anxiety    Atypical chest pain 04/15/2012   Back pain 07/22/2012   Cellulitis of ankle 05/12/2011   CHICKENPOX, HX OF 01/20/2011   Elevated BP 04/15/2012   Epistaxis 10/26/2016   GERD 01/20/2011   Hyperglycemia 05/10/2016   Insomnia 12/12/2012   LACERATION OF FINGER 01/27/2011   Overweight 01/19/2016   Preventative health care 02/28/2014   Sacroiliac joint pain 07/22/2012   Sinusitis, acute 12/12/2012   Tobacco abuse 05/12/2011   Tobacco abuse, in remission 05/12/2011    Past Surgical History:  Procedure Laterality Date   COLONOSCOPY     x2 in 20's & 30's - for hemorrhoids   lasik eye surgery X b/l     WISDOM TOOTH EXTRACTION     x4    Family History  Problem Relation Age of Onset   Hypertension Mother    Arrhythmia  Mother    Lupus Sister    Hypertension Brother    Epilepsy Brother    Cancer Maternal Grandmother        breast   Alzheimer's disease Maternal Grandmother    Aneurysm Maternal Grandfather        brain   Arthritis Maternal Grandfather        rheumatoid   Nephrolithiasis Paternal Grandmother    Heart attack Paternal Grandfather    Anemia Brother    Neuromuscular disorder Neg Hx    Colon cancer Neg Hx    Esophageal cancer Neg Hx    Rectal cancer Neg Hx    Stomach cancer Neg Hx     Social History   Socioeconomic History   Marital status: Married    Spouse name: Not on file   Number of children: Not on file   Years of education: Not on file   Highest education level: Associate degree: occupational, Scientist, product/process development, or vocational program  Occupational History   Not on file  Tobacco Use   Smoking status: Some Days    Current packs/day: 0.00    Types: Cigarettes    Last attempt to quit: 04/12/2012    Years since quitting: 11.6   Smokeless tobacco: Never   Tobacco comments:    smokes when social  Vaping Use  Vaping status: Never Used  Substance and Sexual Activity   Alcohol use: Yes    Alcohol/week: 0.0 standard drinks of alcohol    Comment: socially   Drug use: No   Sexual activity: Yes    Partners: Male    Comment: no dietary, lives with husband, wears seatbelt  Other Topics Concern   Not on file  Social History Narrative   Not on file   Social Determinants of Health   Financial Resource Strain: Low Risk  (05/16/2023)   Overall Financial Resource Strain (CARDIA)    Difficulty of Paying Living Expenses: Not hard at all  Food Insecurity: No Food Insecurity (05/16/2023)   Hunger Vital Sign    Worried About Running Out of Food in the Last Year: Never true    Ran Out of Food in the Last Year: Never true  Transportation Needs: No Transportation Needs (05/16/2023)   PRAPARE - Administrator, Civil Service (Medical): No    Lack of Transportation (Non-Medical): No   Physical Activity: Insufficiently Active (05/16/2023)   Exercise Vital Sign    Days of Exercise per Week: 3 days    Minutes of Exercise per Session: 20 min  Stress: No Stress Concern Present (05/16/2023)   [employer] of Occupational Health - Occupational Stress Questionnaire    Feeling of Stress : Not at all  Social Connections: Socially Integrated (05/16/2023)   Social Connection and Isolation Panel [NHANES]    Frequency of Communication with Friends and Family: More than three times a week    Frequency of Social Gatherings with Friends and Family: Once a week    Attends Religious Services: More than 4 times per year    Active Member of [employer] or Organizations: Yes    Attends Engineer, structural: More than 4 times per year    Marital Status: Married  Catering manager Violence: Not on file    Outpatient Medications Prior to Visit  Medication Sig Dispense Refill   acetaminophen (TYLENOL) 325 MG tablet Take 650 mg by mouth every 6 (six) hours as needed.     ALPRAZolam (XANAX) 0.25 MG tablet TAKE ONE TABLET BY MOUTH TWICE DAILY AS NEEDED FOR ANXIETY OR SLEEP 60 tablet 1   Ascorbic Acid (VITAMIN C) 1000 MG tablet Take 1,000 mg by mouth daily.     Cholecalciferol (VITAMIN D3 PO) Take by mouth.     Cyanocobalamin (VITAMIN B-12 PO) Take by mouth.     escitalopram (LEXAPRO) 20 MG tablet Take 1 tablet (20 mg total) by mouth daily. 90 tablet 0   estradiol (ESTRACE) 0.1 MG/GM vaginal cream Place 1 Applicatorful vaginally 2 (two) times a week. 42.5 g 2   omeprazole (PRILOSEC) 20 MG capsule 90TAKE 1 CAPSULE BY MOUTH EVERY DAY 90 capsule 1   zinc gluconate 50 MG tablet Take 50 mg by mouth daily.     zolpidem (AMBIEN) 10 MG tablet Take 1 tablet (10 mg total) by mouth at bedtime as needed for sleep. 30 tablet 3   doxycycline (VIBRAMYCIN) 50 MG capsule Take 1 capsule (50 mg total) by mouth 2 (two) times daily. 20 capsule 0   TURMERIC PO Take 1,000 mg by mouth daily.     No  facility-administered medications prior to visit.    Allergies  Allergen Reactions   Penicillins     REACTION: swelling, rash   Sulfonamide Derivatives     REACTION: GI upset    Review of Systems  Constitutional:  Negative for chills,  fever and malaise/fatigue.  HENT:  Negative for congestion and hearing loss.   Eyes:  Negative for discharge.  Respiratory:  Negative for cough, sputum production and shortness of breath.   Cardiovascular:  Negative for chest pain, palpitations and leg swelling.  Gastrointestinal:  Negative for abdominal pain, blood in stool, constipation, diarrhea, heartburn, nausea and vomiting.  Genitourinary:  Negative for dysuria, frequency, hematuria and urgency.  Musculoskeletal:  Negative for back pain, falls and myalgias.  Skin:  Negative for rash.  Neurological:  Negative for dizziness, sensory change, loss of consciousness, weakness and headaches.  Endo/Heme/Allergies:  Negative for environmental allergies. Does not bruise/bleed easily.  Psychiatric/Behavioral:  Negative for depression and suicidal ideas. The patient has insomnia. The patient is not nervous/anxious.        Objective:    Physical Exam Constitutional:      General: She is not in acute distress.    Appearance: Normal appearance. She is well-developed. She is not toxic-appearing.  HENT:     Head: Normocephalic and atraumatic.     Right Ear: External ear normal.     Left Ear: External ear normal.     Nose: Nose normal.  Eyes:     General:        Right eye: No discharge.        Left eye: No discharge.     Conjunctiva/sclera: Conjunctivae normal.  Neck:     Thyroid: No thyromegaly.  Cardiovascular:     Rate and Rhythm: Normal rate and regular rhythm.     Heart sounds: Normal heart sounds. No murmur heard. Pulmonary:     Effort: Pulmonary effort is normal. No respiratory distress.     Breath sounds: Normal breath sounds.  Abdominal:     General: Bowel sounds are normal.      Palpations: Abdomen is soft.     Tenderness: There is no abdominal tenderness. There is no guarding.  Musculoskeletal:        General: Normal range of motion.     Cervical back: Neck supple.  Lymphadenopathy:     Cervical: No cervical adenopathy.  Skin:    General: Skin is warm and dry.  Neurological:     Mental Status: She is alert and oriented to person, place, and time.  Psychiatric:        Mood and Affect: Mood normal.        Behavior: Behavior normal.        Thought Content: Thought content normal.        Judgment: Judgment normal.     BP 132/80 (BP Location: Left Arm, Patient Position: Sitting, Cuff Size: Normal)   Pulse 62   Temp 98.2 F (36.8 C) (Oral)   Resp 16   Ht 5\' 7"  (1.702 m)   Wt 161 lb 6.4 oz (73.2 kg)   SpO2 98%   BMI 25.28 kg/m  Wt Readings from Last 3 Encounters:  12/07/23 161 lb 6.4 oz (73.2 kg)  05/17/23 159 lb 12.8 oz (72.5 kg)  11/16/22 185 lb 3.2 oz (84 kg)    Diabetic Foot Exam - Simple   No data filed    Lab Results  Component Value Date   WBC 5.7 12/07/2023   HGB 13.7 12/07/2023   HCT 41.0 12/07/2023   PLT 272.0 12/07/2023   GLUCOSE 87 12/07/2023   CHOL 199 12/07/2023   TRIG 53.0 12/07/2023   HDL 101.10 12/07/2023   LDLCALC 87 12/07/2023   ALT 14 12/07/2023   AST 19  12/07/2023   NA 137 12/07/2023   K 5.0 12/07/2023   CL 100 12/07/2023   CREATININE 0.65 12/07/2023   BUN 11 12/07/2023   CO2 29 12/07/2023   TSH 1.34 12/07/2023   INR 1.0 12/05/2015   HGBA1C 5.5 12/07/2023    Lab Results  Component Value Date   TSH 1.34 12/07/2023   Lab Results  Component Value Date   WBC 5.7 12/07/2023   HGB 13.7 12/07/2023   HCT 41.0 12/07/2023   MCV 101.3 (H) 12/07/2023   PLT 272.0 12/07/2023   Lab Results  Component Value Date   NA 137 12/07/2023   K 5.0 12/07/2023   CO2 29 12/07/2023   GLUCOSE 87 12/07/2023   BUN 11 12/07/2023   CREATININE 0.65 12/07/2023   BILITOT 1.0 12/07/2023   ALKPHOS 87 12/07/2023   AST 19  12/07/2023   ALT 14 12/07/2023   PROT 7.4 12/07/2023   ALBUMIN 4.8 12/07/2023   CALCIUM 9.8 12/07/2023   ANIONGAP 6 02/07/2022   GFR 101.19 12/07/2023   Lab Results  Component Value Date   CHOL 199 12/07/2023   Lab Results  Component Value Date   HDL 101.10 12/07/2023   Lab Results  Component Value Date   LDLCALC 87 12/07/2023   Lab Results  Component Value Date   TRIG 53.0 12/07/2023   Lab Results  Component Value Date   CHOLHDL 2 12/07/2023   Lab Results  Component Value Date   HGBA1C 5.5 12/07/2023       Assessment & Plan:  Preventative health care Assessment & Plan: Patient encouraged to maintain heart healthy diet, regular exercise, adequate sleep. Consider daily probiotics. Take medications as prescribed. Labs ordered and reviewed  Colonoscopy 2022 repeat in 2025  [facility] repeat every 1-2 years Pap July 2023 [facility] requested records Shingrix is the new shingles shot, 2 shots over 2-6 months, confirm coverage with insurance and document, then can return here for shots with nurse appt or at pharmacy  Consider flu and covid boosters Shingrix is the new shingles shot, 2 shots over 2-6 months, confirm coverage with insurance and document, then can return here for shots with nurse appt or at pharmacy    Hyperglycemia Assessment & Plan: hgba1c acceptable, minimize simple carbs. Increase exercise as tolerated.   Orders: -     CBC with Differential/Platelet -     Comprehensive metabolic panel -     TSH -     Hemoglobin A1c  Hyperlipidemia, unspecified hyperlipidemia type Assessment & Plan: Encourage heart healthy diet such as MIND or DASH diet, increase exercise, avoid trans fats, simple carbohydrates and processed foods, consider a krill or fish or flaxseed oil cap daily.    Orders: -     CBC with Differential/Platelet -     Lipid panel -     TSH  Insomnia, unspecified type Assessment & Plan: Encouraged good sleep hygiene such as  dark, quiet room. No blue/green glowing lights such as computer screens in bedroom. No alcohol or stimulants in evening. Cut down on caffeine as able. Regular exercise is helpful but not just prior to bed time.  Zolpidem prn  Orders: -     CBC with Differential/Platelet -     VITAMIN D 25 Hydroxy (Vit-D Deficiency, Fractures)  Vitamin D deficiency Assessment & Plan: Supplement and monitor    Depression with anxiety Assessment & Plan: Stable on current meds.   Orders: -     CBC with Differential/Platelet -  Drug Monitoring Panel [phone] , Urine  Need for hepatitis C screening test -     Hepatitis C antibody  High risk medication use -     Drug Monitoring Panel [phone] , Urine    Assessment and Plan    Immunizations Discussed the benefits of the Shingrix vaccine in reducing the risk of shingles and potentially dementia. Patient plans to receive the vaccine at a local pharmacy. Also discussed the benefits of the annual influenza vaccine and COVID-19 booster in reducing the severity of illness. -Administer influenza vaccine today. -Encourage patient to receive Shingrix vaccine at local pharmacy. -Consider COVID-19 booster vaccine.  Colon Cancer Screening History of polyps. Last colonoscopy in 2022. Patient plans to schedule a colonoscopy but has encountered scheduling difficulties. -Encourage patient to schedule colonoscopy. -If scheduling difficulties persist, consider sending iFOBT kit to patient's home.  General Health Maintenance / Followup Plans -Obtain recent mammogram results from July 2024. -Check blood work today. -Follow-up in 6 months or sooner if needed.         Danise Edge, MD

## 2023-12-09 LAB — DRUG MONITORING PANEL 376104, URINE
Amphetamines: NEGATIVE ng/mL (ref <500)
Barbiturates: NEGATIVE ng/mL (ref <300)
Benzodiazepines: NEGATIVE ng/mL (ref <100)
Cocaine Metabolite: NEGATIVE ng/mL (ref <150)
Desmethyltramadol: NEGATIVE ng/mL (ref <100)
Opiates: NEGATIVE ng/mL (ref <100)
Oxycodone: NEGATIVE ng/mL (ref <100)
Tramadol: NEGATIVE ng/mL (ref <100)

## 2023-12-09 LAB — DM TEMPLATE

## 2023-12-10 ENCOUNTER — Telehealth: Payer: Self-pay | Admitting: Emergency Medicine

## 2023-12-10 NOTE — Telephone Encounter (Signed)
Requesting: Alprazolam (Xanax) 0.25 mg Tablet Contract: 12/07/2023 UDS:12/07/2023 Last Visit:12/07/2023 Next Visit: None Last Refill: 10/01/2023  Please Advise   PLEASE SEND TO [facility], [place]

## 2023-12-16 NOTE — Telephone Encounter (Signed)
Opened in error

## 2024-01-03 DIAGNOSIS — M9906 Segmental and somatic dysfunction of lower extremity: Secondary | ICD-10-CM | POA: Diagnosis not present

## 2024-01-03 DIAGNOSIS — M9903 Segmental and somatic dysfunction of lumbar region: Secondary | ICD-10-CM | POA: Diagnosis not present

## 2024-01-03 DIAGNOSIS — M7712 Lateral epicondylitis, left elbow: Secondary | ICD-10-CM | POA: Diagnosis not present

## 2024-01-03 DIAGNOSIS — H43811 Vitreous degeneration, right eye: Secondary | ICD-10-CM | POA: Diagnosis not present

## 2024-01-03 DIAGNOSIS — M9901 Segmental and somatic dysfunction of cervical region: Secondary | ICD-10-CM | POA: Diagnosis not present

## 2024-01-10 ENCOUNTER — Other Ambulatory Visit: Payer: Self-pay | Admitting: Family

## 2024-01-11 DIAGNOSIS — R509 Fever, unspecified: Secondary | ICD-10-CM | POA: Diagnosis not present

## 2024-01-11 DIAGNOSIS — Z03818 Encounter for observation for suspected exposure to other biological agents ruled out: Secondary | ICD-10-CM | POA: Diagnosis not present

## 2024-01-11 DIAGNOSIS — R0981 Nasal congestion: Secondary | ICD-10-CM | POA: Diagnosis not present

## 2024-01-11 DIAGNOSIS — J209 Acute bronchitis, unspecified: Secondary | ICD-10-CM | POA: Diagnosis not present

## 2024-01-31 DIAGNOSIS — M9906 Segmental and somatic dysfunction of lower extremity: Secondary | ICD-10-CM | POA: Diagnosis not present

## 2024-01-31 DIAGNOSIS — M7712 Lateral epicondylitis, left elbow: Secondary | ICD-10-CM | POA: Diagnosis not present

## 2024-01-31 DIAGNOSIS — M9903 Segmental and somatic dysfunction of lumbar region: Secondary | ICD-10-CM | POA: Diagnosis not present

## 2024-01-31 DIAGNOSIS — M9901 Segmental and somatic dysfunction of cervical region: Secondary | ICD-10-CM | POA: Diagnosis not present

## 2024-02-01 ENCOUNTER — Other Ambulatory Visit: Payer: Self-pay | Admitting: Family Medicine

## 2024-02-28 DIAGNOSIS — M7712 Lateral epicondylitis, left elbow: Secondary | ICD-10-CM | POA: Diagnosis not present

## 2024-02-28 DIAGNOSIS — M9903 Segmental and somatic dysfunction of lumbar region: Secondary | ICD-10-CM | POA: Diagnosis not present

## 2024-02-28 DIAGNOSIS — M9901 Segmental and somatic dysfunction of cervical region: Secondary | ICD-10-CM | POA: Diagnosis not present

## 2024-02-28 DIAGNOSIS — M9906 Segmental and somatic dysfunction of lower extremity: Secondary | ICD-10-CM | POA: Diagnosis not present

## 2024-03-06 DIAGNOSIS — Z03818 Encounter for observation for suspected exposure to other biological agents ruled out: Secondary | ICD-10-CM | POA: Diagnosis not present

## 2024-03-06 DIAGNOSIS — H6503 Acute serous otitis media, bilateral: Secondary | ICD-10-CM | POA: Diagnosis not present

## 2024-03-06 DIAGNOSIS — H9203 Otalgia, bilateral: Secondary | ICD-10-CM | POA: Diagnosis not present

## 2024-03-27 DIAGNOSIS — M9901 Segmental and somatic dysfunction of cervical region: Secondary | ICD-10-CM | POA: Diagnosis not present

## 2024-03-27 DIAGNOSIS — M9906 Segmental and somatic dysfunction of lower extremity: Secondary | ICD-10-CM | POA: Diagnosis not present

## 2024-03-27 DIAGNOSIS — M7712 Lateral epicondylitis, left elbow: Secondary | ICD-10-CM | POA: Diagnosis not present

## 2024-03-27 DIAGNOSIS — M9903 Segmental and somatic dysfunction of lumbar region: Secondary | ICD-10-CM | POA: Diagnosis not present

## 2024-04-19 DIAGNOSIS — M7712 Lateral epicondylitis, left elbow: Secondary | ICD-10-CM | POA: Diagnosis not present

## 2024-04-19 DIAGNOSIS — M9901 Segmental and somatic dysfunction of cervical region: Secondary | ICD-10-CM | POA: Diagnosis not present

## 2024-04-19 DIAGNOSIS — M9903 Segmental and somatic dysfunction of lumbar region: Secondary | ICD-10-CM | POA: Diagnosis not present

## 2024-04-19 DIAGNOSIS — M9906 Segmental and somatic dysfunction of lower extremity: Secondary | ICD-10-CM | POA: Diagnosis not present

## 2024-05-01 ENCOUNTER — Other Ambulatory Visit: Payer: Self-pay | Admitting: Family Medicine

## 2024-05-03 DIAGNOSIS — H6983 Other specified disorders of Eustachian tube, bilateral: Secondary | ICD-10-CM | POA: Diagnosis not present

## 2024-05-08 DIAGNOSIS — L814 Other melanin hyperpigmentation: Secondary | ICD-10-CM | POA: Diagnosis not present

## 2024-05-08 DIAGNOSIS — Z789 Other specified health status: Secondary | ICD-10-CM | POA: Diagnosis not present

## 2024-05-08 DIAGNOSIS — L820 Inflamed seborrheic keratosis: Secondary | ICD-10-CM | POA: Diagnosis not present

## 2024-05-08 DIAGNOSIS — L2989 Other pruritus: Secondary | ICD-10-CM | POA: Diagnosis not present

## 2024-05-08 DIAGNOSIS — L821 Other seborrheic keratosis: Secondary | ICD-10-CM | POA: Diagnosis not present

## 2024-05-08 DIAGNOSIS — R208 Other disturbances of skin sensation: Secondary | ICD-10-CM | POA: Diagnosis not present

## 2024-05-08 DIAGNOSIS — D225 Melanocytic nevi of trunk: Secondary | ICD-10-CM | POA: Diagnosis not present

## 2024-05-23 DIAGNOSIS — M9901 Segmental and somatic dysfunction of cervical region: Secondary | ICD-10-CM | POA: Diagnosis not present

## 2024-05-23 DIAGNOSIS — M7712 Lateral epicondylitis, left elbow: Secondary | ICD-10-CM | POA: Diagnosis not present

## 2024-05-23 DIAGNOSIS — M9906 Segmental and somatic dysfunction of lower extremity: Secondary | ICD-10-CM | POA: Diagnosis not present

## 2024-05-23 DIAGNOSIS — M9903 Segmental and somatic dysfunction of lumbar region: Secondary | ICD-10-CM | POA: Diagnosis not present

## 2024-05-30 NOTE — Progress Notes (Unsigned)
 Subjective:     Patient ID: Samantha Morales, female    DOB: 12-14-1971, 53 y.o.   MRN: [id number]  No chief complaint on file.   HPI  Samantha Morales is a 53 y.o. female presents for 6 month follow-up     Insomnia Taking Xanax  0.25 mg twice daily as needed for anxiety or sleep Ambien  10 mg as needed for sleep Compliance:  GERD Omeprazole  20 mg daily  Depression with anxiety Lexapro  20 mg daily   Patient denies fever, chills, SOB, CP, palpitations, dyspnea, edema, HA, vision changes, N/V/D, abdominal pain, urinary symptoms, rash, weight changes, and recent illness or hospitalizations.   History of Present Illness              Health Maintenance Due  Topic Date Due   COVID-19 Vaccine (1) Never done   Pneumococcal Vaccine 81-60 Years old (1 of 2 - PCV) Never done   Zoster Vaccines- Shingrix (1 of 2) Never done   Cervical Cancer Screening (HPV/Pap Cotest)  02/21/2022    Past Medical History:  Diagnosis Date   Acute upper respiratory infection 10/26/2016   Allergy    Anemia 03/13/2015   ANEMIA, HX OF 01/20/2011   Anxiety    Atypical chest pain 04/15/2012   Back pain 07/22/2012   Cellulitis of ankle 05/12/2011   CHICKENPOX, HX OF 01/20/2011   Elevated BP 04/15/2012   Epistaxis 10/26/2016   GERD 01/20/2011   Hyperglycemia 05/10/2016   Insomnia 12/12/2012   LACERATION OF FINGER 01/27/2011   Overweight 01/19/2016   Preventative health care 02/28/2014   Sacroiliac joint pain 07/22/2012   Sinusitis, acute 12/12/2012   Tobacco abuse 05/12/2011   Tobacco abuse, in remission 05/12/2011    Past Surgical History:  Procedure Laterality Date   COLONOSCOPY     x2 in 20's & 30's - for hemorrhoids   lasik eye surgery X b/l     WISDOM TOOTH EXTRACTION     x4    Family History  Problem Relation Age of Onset   Hypertension Mother    Arrhythmia Mother    Lupus Sister    Hypertension Brother    Epilepsy Brother    Cancer Maternal Grandmother        breast    Alzheimer's disease Maternal Grandmother    Aneurysm Maternal Grandfather        brain   Arthritis Maternal Grandfather        rheumatoid   Nephrolithiasis Paternal Grandmother    Heart attack Paternal Grandfather    Anemia Brother    Neuromuscular disorder Neg Hx    Colon cancer Neg Hx    Esophageal cancer Neg Hx    Rectal cancer Neg Hx    Stomach cancer Neg Hx     Social History   Socioeconomic History   Marital status: Married    Spouse name: Not on file   Number of children: Not on file   Years of education: Not on file   Highest education level: Associate degree: occupational, Scientist, product/process development, or vocational program  Occupational History   Not on file  Tobacco Use   Smoking status: Some Days    Current packs/day: 0.00    Types: Cigarettes    Last attempt to quit: 04/12/2012    Years since quitting: 12.1   Smokeless tobacco: Never   Tobacco comments:    smokes when social  Vaping Use   Vaping status: Never Used  Substance and Sexual Activity   Alcohol  use: Yes    Alcohol/week: 0.0 standard drinks of alcohol    Comment: socially   Drug use: No   Sexual activity: Yes    Partners: Male    Comment: no dietary, lives with husband, wears seatbelt  Other Topics Concern   Not on file  Social History Narrative   Not on file   Social Drivers of Health   Financial Resource Strain: Low Risk  (05/16/2023)   Overall Financial Resource Strain (CARDIA)    Difficulty of Paying Living Expenses: Not hard at all  Food Insecurity: No Food Insecurity (05/16/2023)   Hunger Vital Sign    Worried About Running Out of Food in the Last Year: Never true    Ran Out of Food in the Last Year: Never true  Transportation Needs: No Transportation Needs (05/16/2023)   PRAPARE - Administrator, Civil Service (Medical): No    Lack of Transportation (Non-Medical): No  Physical Activity: Insufficiently Active (05/16/2023)   Exercise Vital Sign    Days of Exercise per Week: 3 days     Minutes of Exercise per Session: 20 min  Stress: No Stress Concern Present (05/16/2023)   [employer] of Occupational Health - Occupational Stress Questionnaire    Feeling of Stress : Not at all  Social Connections: Socially Integrated (05/16/2023)   Social Connection and Isolation Panel [NHANES]    Frequency of Communication with Friends and Family: More than three times a week    Frequency of Social Gatherings with Friends and Family: Once a week    Attends Religious Services: More than 4 times per year    Active Member of [employer] or Organizations: Yes    Attends Engineer, structural: More than 4 times per year    Marital Status: Married  Catering manager Violence: Not on file    Outpatient Medications Prior to Visit  Medication Sig Dispense Refill   acetaminophen  (TYLENOL ) 325 MG tablet Take 650 mg by mouth every 6 (six) hours as needed.     ALPRAZolam  (XANAX ) 0.25 MG tablet TAKE ONE TABLET BY MOUTH TWICE DAILY AS NEEDED FOR ANXIETY OR SLEEP 60 tablet 1   Ascorbic Acid (VITAMIN C) 1000 MG tablet Take 1,000 mg by mouth daily.     Cholecalciferol (VITAMIN D3 PO) Take by mouth.     Cyanocobalamin  (VITAMIN B-12 PO) Take by mouth.     escitalopram  (LEXAPRO ) 20 MG tablet Take 1 tablet (20 mg total) by mouth daily. 90 tablet 1   estradiol  (ESTRACE ) 0.1 MG/GM vaginal cream Place 1 Applicatorful vaginally 2 (two) times a week. 42.5 g 2   omeprazole  (PRILOSEC) 20 MG capsule 90TAKE 1 CAPSULE BY MOUTH EVERY DAY 90 capsule 1   zinc gluconate 50 MG tablet Take 50 mg by mouth daily.     zolpidem  (AMBIEN ) 10 MG tablet Take 1 tablet (10 mg total) by mouth at bedtime as needed for sleep. 30 tablet 3   No facility-administered medications prior to visit.    Allergies  Allergen Reactions   Penicillins     REACTION: swelling, rash   Sulfonamide Derivatives     REACTION: GI upset    ROS     Objective:     Physical Exam   There were no vitals taken for this visit. Wt Readings  from Last 3 Encounters:  12/07/23 161 lb 6.4 oz (73.2 kg)  05/17/23 159 lb 12.8 oz (72.5 kg)  11/16/22 185 lb 3.2 oz (84 kg)  Assessment & Plan:   Problem List Items Addressed This Visit   None   I am having Samantha Morales maintain her omeprazole , acetaminophen , vitamin C, Cholecalciferol (VITAMIN D3 PO), zinc gluconate, Cyanocobalamin  (VITAMIN B-12 PO), estradiol , zolpidem , ALPRAZolam , and escitalopram .  No orders of the defined types were placed in this encounter.

## 2024-05-31 ENCOUNTER — Encounter: Payer: Self-pay | Admitting: Family Medicine

## 2024-06-01 ENCOUNTER — Ambulatory Visit: Payer: Self-pay | Admitting: Student

## 2024-06-01 ENCOUNTER — Encounter: Payer: Self-pay | Admitting: *Deleted

## 2024-06-01 ENCOUNTER — Encounter: Payer: Self-pay | Admitting: Student

## 2024-06-01 ENCOUNTER — Ambulatory Visit: Admitting: Student

## 2024-06-01 VITALS — BP 118/80 | HR 64 | Temp 98.0°F | Resp 12 | Ht 67.0 in | Wt 162.4 lb

## 2024-06-01 DIAGNOSIS — E663 Overweight: Secondary | ICD-10-CM | POA: Diagnosis not present

## 2024-06-01 DIAGNOSIS — G4700 Insomnia, unspecified: Secondary | ICD-10-CM

## 2024-06-01 DIAGNOSIS — F418 Other specified anxiety disorders: Secondary | ICD-10-CM | POA: Diagnosis not present

## 2024-06-01 DIAGNOSIS — K219 Gastro-esophageal reflux disease without esophagitis: Secondary | ICD-10-CM | POA: Diagnosis not present

## 2024-06-01 DIAGNOSIS — Z79899 Other long term (current) drug therapy: Secondary | ICD-10-CM

## 2024-06-01 LAB — COMPREHENSIVE METABOLIC PANEL WITH GFR
ALT: 14 U/L (ref 0–35)
AST: 18 U/L (ref 0–37)
Albumin: 4.8 g/dL (ref 3.5–5.2)
Alkaline Phosphatase: 79 U/L (ref 39–117)
BUN: 13 mg/dL (ref 6–23)
CO2: 25 meq/L (ref 19–32)
Calcium: 10 mg/dL (ref 8.4–10.5)
Chloride: 101 meq/L (ref 96–112)
Creatinine, Ser: 0.67 mg/dL (ref 0.40–1.20)
GFR: 100.11 mL/min (ref >60.00)
Glucose, Bld: 83 mg/dL (ref 70–99)
Potassium: 4.2 meq/L (ref 3.5–5.1)
Sodium: 139 meq/L (ref 135–145)
Total Bilirubin: 0.8 mg/dL (ref 0.2–1.2)
Total Protein: 7.5 g/dL (ref 6.0–8.3)

## 2024-06-01 LAB — CBC WITH DIFFERENTIAL/PLATELET
Basophils Absolute: 0.1 10*3/uL (ref 0.0–0.1)
Basophils Relative: 1.1 % (ref 0.0–3.0)
Eosinophils Absolute: 0.1 10*3/uL (ref 0.0–0.7)
Eosinophils Relative: 3 % (ref 0.0–5.0)
HCT: 39.6 % (ref 36.0–46.0)
Hemoglobin: 13.4 g/dL (ref 12.0–15.0)
Lymphocytes Relative: 37.2 % (ref 12.0–46.0)
Lymphs Abs: 1.7 10*3/uL (ref 0.7–4.0)
MCHC: 33.8 g/dL (ref 30.0–36.0)
MCV: 98 fl (ref 78.0–100.0)
Monocytes Absolute: 0.4 10*3/uL (ref 0.1–1.0)
Monocytes Relative: 7.8 % (ref 3.0–12.0)
Neutro Abs: 2.4 10*3/uL (ref 1.4–7.7)
Neutrophils Relative %: 50.9 % (ref 43.0–77.0)
Platelets: 241 10*3/uL (ref 150.0–400.0)
RBC: 4.04 Mil/uL (ref 3.87–5.11)
RDW: 13.5 % (ref 11.5–15.5)
WBC: 4.6 10*3/uL (ref 4.0–10.5)

## 2024-06-01 LAB — LIPID PANEL
Cholesterol: 200 mg/dL (ref 0–200)
HDL: 103.2 mg/dL (ref >39.00)
LDL Cholesterol: 87 mg/dL (ref 0–99)
NonHDL: 97.06
Total CHOL/HDL Ratio: 2
Triglycerides: 51 mg/dL (ref 0.0–149.0)
VLDL: 10.2 mg/dL (ref 0.0–40.0)

## 2024-06-01 LAB — VITAMIN D 25 HYDROXY (VIT D DEFICIENCY, FRACTURES): VITD: 46.44 ng/mL (ref 30.00–100.00)

## 2024-06-01 MED ORDER — ALPRAZOLAM 0.25 MG PO TABS: 0.2500 mg | ORAL_TABLET | Freq: Two times a day (BID) | ORAL | 1 refills | Status: AC | PRN

## 2024-06-01 MED ORDER — ESCITALOPRAM OXALATE 20 MG PO TABS: 20.0000 mg | ORAL_TABLET | Freq: Every day | ORAL | 1 refills | Status: DC

## 2024-06-01 NOTE — Assessment & Plan Note (Signed)
 Stable on current meds

## 2024-06-01 NOTE — Assessment & Plan Note (Addendum)
 Pt did not like zolpidem  due to side undesirable SEs.  Patient reports taking presently to help with sleep, she states she does not use this every night. Encouraged good sleep hygiene such as dark, quiet room. No blue/green glowing lights such as computer screens in bedroom. No alcohol or stimulants in evening. Cut down on caffeine as able. Regular exercise is helpful but not just prior to bed time.

## 2024-06-01 NOTE — Assessment & Plan Note (Addendum)
 Doing well will drop Omeprazole  to 20 mg po daily.  She attempted to decrease this to every other day but unable to tolerate.  Current is working well for her and advised patient to avoid offending foods.

## 2024-06-01 NOTE — Assessment & Plan Note (Signed)
UDS updated today.

## 2024-06-04 LAB — DRUG MONITORING PANEL 376104, URINE

## 2024-06-04 LAB — DM TEMPLATE

## 2024-06-05 ENCOUNTER — Encounter: Payer: Self-pay | Admitting: Family Medicine

## 2024-06-12 ENCOUNTER — Ambulatory Visit: Payer: BC Managed Care – PPO | Admitting: Family Medicine

## 2024-06-29 ENCOUNTER — Other Ambulatory Visit: Payer: Self-pay | Admitting: Student

## 2024-07-20 DIAGNOSIS — M9903 Segmental and somatic dysfunction of lumbar region: Secondary | ICD-10-CM | POA: Diagnosis not present

## 2024-07-20 DIAGNOSIS — M9901 Segmental and somatic dysfunction of cervical region: Secondary | ICD-10-CM | POA: Diagnosis not present

## 2024-07-20 DIAGNOSIS — M9906 Segmental and somatic dysfunction of lower extremity: Secondary | ICD-10-CM | POA: Diagnosis not present

## 2024-07-20 DIAGNOSIS — M7712 Lateral epicondylitis, left elbow: Secondary | ICD-10-CM | POA: Diagnosis not present

## 2024-07-27 DIAGNOSIS — Z6825 Body mass index (BMI) 25.0-25.9, adult: Secondary | ICD-10-CM | POA: Diagnosis not present

## 2024-07-27 DIAGNOSIS — Z1151 Encounter for screening for human papillomavirus (HPV): Secondary | ICD-10-CM | POA: Diagnosis not present

## 2024-07-27 DIAGNOSIS — R311 Benign essential microscopic hematuria: Secondary | ICD-10-CM | POA: Diagnosis not present

## 2024-07-27 DIAGNOSIS — Z1231 Encounter for screening mammogram for malignant neoplasm of breast: Secondary | ICD-10-CM | POA: Diagnosis not present

## 2024-07-27 DIAGNOSIS — Z124 Encounter for screening for malignant neoplasm of cervix: Secondary | ICD-10-CM | POA: Diagnosis not present

## 2024-07-27 DIAGNOSIS — Z01419 Encounter for gynecological examination (general) (routine) without abnormal findings: Secondary | ICD-10-CM | POA: Diagnosis not present

## 2024-08-01 ENCOUNTER — Other Ambulatory Visit: Payer: Self-pay | Admitting: Obstetrics and Gynecology

## 2024-08-01 DIAGNOSIS — R928 Other abnormal and inconclusive findings on diagnostic imaging of breast: Secondary | ICD-10-CM

## 2024-08-02 ENCOUNTER — Ambulatory Visit

## 2024-08-02 ENCOUNTER — Ambulatory Visit
Admission: RE | Admit: 2024-08-02 | Discharge: 2024-08-02 | Disposition: A | Discharge status: Home / Self Care | Source: Ambulatory Visit | Attending: Obstetrics and Gynecology | Admitting: Obstetrics and Gynecology

## 2024-08-02 DIAGNOSIS — R92321 Mammographic fibroglandular density, right breast: Secondary | ICD-10-CM | POA: Diagnosis not present

## 2024-08-02 DIAGNOSIS — R928 Other abnormal and inconclusive findings on diagnostic imaging of breast: Secondary | ICD-10-CM

## 2024-08-02 DIAGNOSIS — R92331 Mammographic heterogeneous density, right breast: Secondary | ICD-10-CM | POA: Diagnosis not present

## 2024-08-09 DIAGNOSIS — M9903 Segmental and somatic dysfunction of lumbar region: Secondary | ICD-10-CM | POA: Diagnosis not present

## 2024-08-09 DIAGNOSIS — M7712 Lateral epicondylitis, left elbow: Secondary | ICD-10-CM | POA: Diagnosis not present

## 2024-08-09 DIAGNOSIS — M9901 Segmental and somatic dysfunction of cervical region: Secondary | ICD-10-CM | POA: Diagnosis not present

## 2024-08-09 DIAGNOSIS — M9906 Segmental and somatic dysfunction of lower extremity: Secondary | ICD-10-CM | POA: Diagnosis not present

## 2024-09-06 DIAGNOSIS — M7712 Lateral epicondylitis, left elbow: Secondary | ICD-10-CM | POA: Diagnosis not present

## 2024-09-06 DIAGNOSIS — M9901 Segmental and somatic dysfunction of cervical region: Secondary | ICD-10-CM | POA: Diagnosis not present

## 2024-09-06 DIAGNOSIS — M9906 Segmental and somatic dysfunction of lower extremity: Secondary | ICD-10-CM | POA: Diagnosis not present

## 2024-09-06 DIAGNOSIS — M9903 Segmental and somatic dysfunction of lumbar region: Secondary | ICD-10-CM | POA: Diagnosis not present

## 2024-09-15 DIAGNOSIS — J00 Acute nasopharyngitis [common cold]: Secondary | ICD-10-CM | POA: Diagnosis not present

## 2024-09-15 DIAGNOSIS — Z03818 Encounter for observation for suspected exposure to other biological agents ruled out: Secondary | ICD-10-CM | POA: Diagnosis not present

## 2024-09-15 DIAGNOSIS — J209 Acute bronchitis, unspecified: Secondary | ICD-10-CM | POA: Diagnosis not present

## 2024-09-15 DIAGNOSIS — R519 Headache, unspecified: Secondary | ICD-10-CM | POA: Diagnosis not present

## 2024-10-04 DIAGNOSIS — M9903 Segmental and somatic dysfunction of lumbar region: Secondary | ICD-10-CM | POA: Diagnosis not present

## 2024-10-04 DIAGNOSIS — M7712 Lateral epicondylitis, left elbow: Secondary | ICD-10-CM | POA: Diagnosis not present

## 2024-10-04 DIAGNOSIS — M9906 Segmental and somatic dysfunction of lower extremity: Secondary | ICD-10-CM | POA: Diagnosis not present

## 2024-10-04 DIAGNOSIS — M9901 Segmental and somatic dysfunction of cervical region: Secondary | ICD-10-CM | POA: Diagnosis not present

## 2024-10-23 ENCOUNTER — Other Ambulatory Visit: Payer: Self-pay | Admitting: Student

## 2024-11-09 DIAGNOSIS — M9906 Segmental and somatic dysfunction of lower extremity: Secondary | ICD-10-CM | POA: Diagnosis not present

## 2024-11-09 DIAGNOSIS — M9901 Segmental and somatic dysfunction of cervical region: Secondary | ICD-10-CM | POA: Diagnosis not present

## 2024-11-09 DIAGNOSIS — M7712 Lateral epicondylitis, left elbow: Secondary | ICD-10-CM | POA: Diagnosis not present

## 2024-11-09 DIAGNOSIS — M9903 Segmental and somatic dysfunction of lumbar region: Secondary | ICD-10-CM | POA: Diagnosis not present

## 2024-12-05 NOTE — Progress Notes (Deleted)
 Subjective:     Patient ID: Samantha Morales, female    DOB: 1971-09-25, 53 y.o.   MRN: [id number]  No chief complaint on file.   HPI  Discussed the use of AI scribe software for clinical note transcription with the patient, who gave verbal consent to proceed.  History of Present Illness           HCM Immunizations: Influenza, PNA, shingles-due MGM-last 06/2023, due 06/2025-follows with [facility]YN- [facility]-last 04/2021, due 04/2026- follows with GYN CRC screen- last 04/2021, Repeat 04/2028     Health Maintenance Due  Topic Date Due   Pneumococcal Vaccine: 50+ Years (1 of 2 - PCV) Never done   Hepatitis B Vaccines 19-59 Average Risk (1 of 3 - 19+ 3-dose series) Never done   Zoster Vaccines- Shingrix (1 of 2) Never done   Influenza Vaccine  07/28/2024    Past Medical History:  Diagnosis Date   Acute upper respiratory infection 10/26/2016   Allergy    Anemia 03/13/2015   ANEMIA, HX OF 01/20/2011   Anxiety    Atypical chest pain 04/15/2012   Back pain 07/22/2012   Cellulitis of ankle 05/12/2011   CHICKENPOX, HX OF 01/20/2011   Elevated BP 04/15/2012   Epistaxis 10/26/2016   GERD 01/20/2011   Hyperglycemia 05/10/2016   Insomnia 12/12/2012   LACERATION OF FINGER 01/27/2011   Overweight 01/19/2016   Preventative health care 02/28/2014   Sacroiliac joint pain 07/22/2012   Sinusitis, acute 12/12/2012   Tobacco abuse 05/12/2011   Tobacco abuse, in remission 05/12/2011    Past Surgical History:  Procedure Laterality Date   COLONOSCOPY     x2 in 20's & 30's - for hemorrhoids   lasik eye surgery X b/l     WISDOM TOOTH EXTRACTION     x4    Family History  Problem Relation Age of Onset   Hypertension Mother    Arrhythmia Mother    Lupus Sister    Hypertension Brother    Epilepsy Brother    Cancer Maternal Grandmother        breast   Alzheimer's disease Maternal Grandmother    Aneurysm Maternal Grandfather        brain   Arthritis Maternal Grandfather         rheumatoid   Nephrolithiasis Paternal Grandmother    Heart attack Paternal Grandfather    Anemia Brother    Neuromuscular disorder Neg Hx    Colon cancer Neg Hx    Esophageal cancer Neg Hx    Rectal cancer Neg Hx    Stomach cancer Neg Hx     Social History   Socioeconomic History   Marital status: Married    Spouse name: Not on file   Number of children: Not on file   Years of education: Not on file   Highest education level: Associate degree: occupational, scientist, product/process development, or vocational program  Occupational History   Not on file  Tobacco Use   Smoking status: Some Days    Current packs/day: 0.00    Types: Cigarettes    Last attempt to quit: 04/12/2012    Years since quitting: 12.6   Smokeless tobacco: Never   Tobacco comments:    smokes when social  Vaping Use   Vaping status: Never Used  Substance and Sexual Activity   Alcohol use: Yes    Alcohol/week: 0.0 standard drinks of alcohol    Comment: socially   Drug use: No   Sexual activity:  Yes    Partners: Male    Comment: no dietary, lives with husband, wears seatbelt  Other Topics Concern   Not on file  Social History Narrative   Not on file   Social Drivers of Health   Financial Resource Strain: Low Risk  (05/16/2023)   Overall Financial Resource Strain (CARDIA)    Difficulty of Paying Living Expenses: Not hard at all  Food Insecurity: No Food Insecurity (05/16/2023)   Hunger Vital Sign    Worried About Running Out of Food in the Last Year: Never true    Ran Out of Food in the Last Year: Never true  Transportation Needs: No Transportation Needs (05/16/2023)   PRAPARE - Administrator, Civil Service (Medical): No    Lack of Transportation (Non-Medical): No  Physical Activity: Insufficiently Active (05/16/2023)   Exercise Vital Sign    Days of Exercise per Week: 3 days    Minutes of Exercise per Session: 20 min  Stress: No Stress Concern Present (05/16/2023)   [employer] of Occupational Health -  Occupational Stress Questionnaire    Feeling of Stress : Not at all  Social Connections: Socially Integrated (05/16/2023)   Social Connection and Isolation Panel    Frequency of Communication with Friends and Family: More than three times a week    Frequency of Social Gatherings with Friends and Family: Once a week    Attends Religious Services: More than 4 times per year    Active Member of [employer] or Organizations: Yes    Attends Engineer, Structural: More than 4 times per year    Marital Status: Married  Catering Manager Violence: Not on file    Outpatient Medications Prior to Visit  Medication Sig Dispense Refill   acetaminophen  (TYLENOL ) 325 MG tablet Take 650 mg by mouth every 6 (six) hours as needed.     ALPRAZolam  (XANAX ) 0.25 MG tablet Take 1 tablet (0.25 mg total) by mouth 2 (two) times daily as needed for anxiety or sleep. 60 tablet 1   Ascorbic Acid (VITAMIN C) 1000 MG tablet Take 1,000 mg by mouth daily.     Cholecalciferol (VITAMIN D3 PO) Take by mouth.     Cyanocobalamin  (VITAMIN B-12 PO) Take by mouth.     escitalopram  (LEXAPRO ) 20 MG tablet Take 1 tablet (20 mg total) by mouth daily. 90 tablet 1   estradiol  (ESTRACE ) 0.1 MG/GM vaginal cream Place 1 Applicatorful vaginally 2 (two) times a week. 42.5 g 2   omeprazole  (PRILOSEC) 20 MG capsule 90TAKE 1 CAPSULE BY MOUTH EVERY DAY 90 capsule 1   zinc gluconate 50 MG tablet Take 50 mg by mouth daily.     No facility-administered medications prior to visit.    Allergies  Allergen Reactions   Meloxicam  Other (See Comments)    GI upset    Penicillins     REACTION: swelling, rash   Sulfonamide Derivatives     REACTION: GI upset   Zolpidem  Other (See Comments)    Was talking/rambling and did not know she was doing it     ROS     Objective:    Physical Exam   There were no vitals taken for this visit. Wt Readings from Last 3 Encounters:  06/01/24 162 lb 6.4 oz (73.7 kg)  12/07/23 161 lb 6.4 oz (73.2 kg)   05/17/23 159 lb 12.8 oz (72.5 kg)       Assessment & Plan:   Problem List Items Addressed This Visit  None   I am having Samantha Morales maintain her omeprazole , acetaminophen , vitamin C, Cholecalciferol (VITAMIN D3 PO), zinc gluconate, Cyanocobalamin  (VITAMIN B-12 PO), estradiol , ALPRAZolam , and escitalopram .  No orders of the defined types were placed in this encounter.

## 2024-12-07 ENCOUNTER — Encounter: Admitting: Student

## 2024-12-07 DIAGNOSIS — Z0000 Encounter for general adult medical examination without abnormal findings: Secondary | ICD-10-CM

## 2024-12-07 DIAGNOSIS — F418 Other specified anxiety disorders: Secondary | ICD-10-CM

## 2024-12-07 DIAGNOSIS — E559 Vitamin D deficiency, unspecified: Secondary | ICD-10-CM

## 2024-12-07 DIAGNOSIS — K219 Gastro-esophageal reflux disease without esophagitis: Secondary | ICD-10-CM

## 2024-12-13 DIAGNOSIS — M9906 Segmental and somatic dysfunction of lower extremity: Secondary | ICD-10-CM | POA: Diagnosis not present

## 2024-12-13 DIAGNOSIS — M7712 Lateral epicondylitis, left elbow: Secondary | ICD-10-CM | POA: Diagnosis not present

## 2024-12-13 DIAGNOSIS — M9903 Segmental and somatic dysfunction of lumbar region: Secondary | ICD-10-CM | POA: Diagnosis not present

## 2024-12-13 DIAGNOSIS — M9901 Segmental and somatic dysfunction of cervical region: Secondary | ICD-10-CM | POA: Diagnosis not present

## 2025-01-02 DIAGNOSIS — Z0000 Encounter for general adult medical examination without abnormal findings: Secondary | ICD-10-CM | POA: Insufficient documentation

## 2025-01-02 NOTE — Progress Notes (Unsigned)
 "  Subjective:     Patient ID: Samantha Morales, female    DOB: August 09, 1971, 54 y.o.   MRN: [id number]  No chief complaint on file.   HPI  Discussed the use of AI scribe software for clinical note transcription with the patient, who gave verbal consent to proceed.  History of Present Illness      History of Present Illness Samantha Morales is a 54 year old female who presents for an annual physical exam.  She reports feeling well and has no acute concerns today. Denies fever, chills, unintentional weight loss, fatigue, chest pain, palpitations, shortness of breath, cough, wheezing, abdominal pain, nausea, vomiting, diarrhea, constipation, urinary symptoms, new headaches, dizziness, syncope, vision changes, skin changes, rashes, or edema. Reports stable mood with no depression or anxiety symptoms. Sleeping adequately. Appetite good. Maintaining normal activity level without exercise intolerance.  No recent hospitalizations or surgeries    Health Maintenance Due  Topic Date Due   Pneumococcal Vaccine: 50+ Years (1 of 2 - PCV) Never done   Hepatitis B Vaccines 19-59 Average Risk (1 of 3 - 19+ 3-dose series) Never done   Zoster Vaccines- Shingrix (1 of 2) Never done    Past Medical History:  Diagnosis Date   Acute upper respiratory infection 10/26/2016   Allergy    Anemia 03/13/2015   ANEMIA, HX OF 01/20/2011   Anxiety    Atypical chest pain 04/15/2012   Back pain 07/22/2012   Cellulitis of ankle 05/12/2011   CHICKENPOX, HX OF 01/20/2011   Elevated BP 04/15/2012   Epistaxis 10/26/2016   GERD 01/20/2011   Hyperglycemia 05/10/2016   Insomnia 12/12/2012   LACERATION OF FINGER 01/27/2011   Overweight 01/19/2016   Preventative health care 02/28/2014   Sacroiliac joint pain 07/22/2012   Sinusitis, acute 12/12/2012   Tobacco abuse 05/12/2011   Tobacco abuse, in remission 05/12/2011    Past Surgical History:  Procedure Laterality Date   COLONOSCOPY     x2 in 20's & 30's - for hemorrhoids    lasik eye surgery X b/l     WISDOM TOOTH EXTRACTION     x4    Family History  Problem Relation Age of Onset   Hypertension Mother    Arrhythmia Mother    Lupus Sister    Hypertension Brother    Epilepsy Brother    Cancer Maternal Grandmother        breast   Alzheimer's disease Maternal Grandmother    Aneurysm Maternal Grandfather        brain   Arthritis Maternal Grandfather        rheumatoid   Nephrolithiasis Paternal Grandmother    Heart attack Paternal Grandfather    Anemia Brother    Neuromuscular disorder Neg Hx    Colon cancer Neg Hx    Esophageal cancer Neg Hx    Rectal cancer Neg Hx    Stomach cancer Neg Hx     Social History   Socioeconomic History   Marital status: Married    Spouse name: Not on file   Number of children: Not on file   Years of education: Not on file   Highest education level: Associate degree: occupational, scientist, product/process development, or vocational program  Occupational History   Not on file  Tobacco Use   Smoking status: Some Days    Current packs/day: 0.00    Average packs/day: 0.5 packs/day    Types: Cigarettes    Last attempt to quit: 04/12/2012    Years since  quitting: 12.7   Smokeless tobacco: Never   Tobacco comments:    smokes when social  Vaping Use   Vaping status: Never Used  Substance and Sexual Activity   Alcohol use: Yes    Alcohol/week: 0.0 standard drinks of alcohol    Comment: socially   Drug use: No   Sexual activity: Yes    Partners: Male    Comment: no dietary, lives with husband, wears seatbelt  Other Topics Concern   Not on file  Social History Narrative   Not on file   Social Drivers of Health   Tobacco Use: High Risk (01/03/2025)   Patient History    Smoking Tobacco Use: Some Days    Smokeless Tobacco Use: Never    Passive Exposure: Not on file  Financial Resource Strain: Low Risk (05/16/2023)   Overall Financial Resource Strain (CARDIA)    Difficulty of Paying Living Expenses: Not hard at all  Food Insecurity:  No Food Insecurity (05/16/2023)   Hunger Vital Sign    Worried About Running Out of Food in the Last Year: Never true    Ran Out of Food in the Last Year: Never true  Transportation Needs: No Transportation Needs (05/16/2023)   PRAPARE - Administrator, Civil Service (Medical): No    Lack of Transportation (Non-Medical): No  Physical Activity: Insufficiently Active (05/16/2023)   Exercise Vital Sign    Days of Exercise per Week: 3 days    Minutes of Exercise per Session: 20 min  Stress: No Stress Concern Present (05/16/2023)   [employer] of Occupational Health - Occupational Stress Questionnaire    Feeling of Stress : Not at all  Social Connections: Socially Integrated (05/16/2023)   Social Connection and Isolation Panel    Frequency of Communication with Friends and Family: More than three times a week    Frequency of Social Gatherings with Friends and Family: Once a week    Attends Religious Services: More than 4 times per year    Active Member of Clubs or Organizations: Yes    Attends Banker Meetings: More than 4 times per year    Marital Status: Married  Catering Manager Violence: Not on file  Depression (PHQ2-9): Low Risk (01/03/2025)   Depression (PHQ2-9)    PHQ-2 Score: 0  Alcohol Screen: Low Risk (05/16/2023)   Alcohol Screen    Last Alcohol Screening Score (AUDIT): 4  Housing: Low Risk (05/16/2023)   Housing    Last Housing Risk Score: 0  Utilities: Not on file  Health Literacy: Not on file    Outpatient Medications Prior to Visit  Medication Sig Dispense Refill   acetaminophen  (TYLENOL ) 325 MG tablet Take 650 mg by mouth every 6 (six) hours as needed.     ALPRAZolam  (XANAX ) 0.25 MG tablet Take 1 tablet (0.25 mg total) by mouth 2 (two) times daily as needed for anxiety or sleep. 60 tablet 1   Ascorbic Acid (VITAMIN C) 1000 MG tablet Take 1,000 mg by mouth daily.     Cholecalciferol (VITAMIN D3 PO) Take by mouth.     Cyanocobalamin  (VITAMIN  B-12 PO) Take by mouth.     escitalopram  (LEXAPRO ) 20 MG tablet Take 1 tablet (20 mg total) by mouth daily. 90 tablet 1   estradiol  (ESTRACE ) 0.1 MG/GM vaginal cream Place 1 Applicatorful vaginally 2 (two) times a week. 42.5 g 2   omeprazole  (PRILOSEC) 20 MG capsule 90TAKE 1 CAPSULE BY MOUTH EVERY DAY 90 capsule 1  zinc gluconate 50 MG tablet Take 50 mg by mouth daily.     No facility-administered medications prior to visit.    Allergies  Allergen Reactions   Meloxicam  Other (See Comments)    GI upset    Penicillins     REACTION: swelling, rash   Sulfonamide Derivatives     REACTION: GI upset   Zolpidem  Other (See Comments)    Was talking/rambling and did not know she was doing it     ROS    See HPI Objective:    Physical Exam Constitutional:      General: She is not in acute distress.    Appearance: She is not ill-appearing, toxic-appearing or diaphoretic.  HENT:     Head: Normocephalic and atraumatic.     Right Ear: Tympanic membrane, ear canal and external ear normal.     Left Ear: Tympanic membrane, ear canal and external ear normal.     Nose: Nose normal. No congestion.     Mouth/Throat:     Mouth: Mucous membranes are moist.     Pharynx: Oropharynx is clear.  Eyes:     Extraocular Movements: Extraocular movements intact.     Right eye: Normal extraocular motion.     Left eye: Normal extraocular motion.     Conjunctiva/sclera: Conjunctivae normal.     Pupils: Pupils are equal, round, and reactive to light.  Neck:     Thyroid : No thyroid  mass or thyromegaly.     Vascular: No carotid bruit or JVD.  Cardiovascular:     Rate and Rhythm: Normal rate and regular rhythm.     Pulses: Normal pulses.          Radial pulses are 2+ on the right side and 2+ on the left side.       Dorsalis pedis pulses are 2+ on the right side and 2+ on the left side.     Heart sounds: Normal heart sounds, S1 normal and S2 normal. No murmur heard.    No friction rub. No gallop.   Pulmonary:     Effort: Pulmonary effort is normal. No respiratory distress.     Breath sounds: Normal breath sounds.  Abdominal:     General: Bowel sounds are normal. There is no distension.     Palpations: Abdomen is soft.     Tenderness: There is no abdominal tenderness. There is no guarding.  Musculoskeletal:        General: Normal range of motion.     Cervical back: Full passive range of motion without pain and normal range of motion. No edema or erythema.     Right lower leg: No edema.     Left lower leg: No edema.  Lymphadenopathy:     Cervical: No cervical adenopathy.  Skin:    General: Skin is warm and dry.     Capillary Refill: Capillary refill takes less than 2 seconds.  Neurological:     General: No focal deficit present.     Mental Status: She is alert and oriented to person, place, and time.     Cranial Nerves: No cranial nerve deficit.     Motor: No weakness.     Coordination: Coordination normal.     Gait: Gait normal.     Deep Tendon Reflexes: Reflexes normal.  Psychiatric:        Mood and Affect: Mood normal.        Behavior: Behavior normal.        Thought Content: Thought  content normal.        BP 122/71   Pulse (!) 58   Ht 5' 7 (1.702 m)   Wt 168 lb 12.8 oz (76.6 kg)   SpO2 98%   BMI 26.44 kg/m  Wt Readings from Last 3 Encounters:  01/03/25 168 lb 12.8 oz (76.6 kg)  06/01/24 162 lb 6.4 oz (73.7 kg)  12/07/23 161 lb 6.4 oz (73.2 kg)       Assessment & Plan:   Problem List Items Addressed This Visit       Endocrine   Hyperglycemia   hgba1c acceptable, minimize simple carbs. Increase exercise as tolerated.       Relevant Orders   HgB A1c     Other   Annual physical exam - Primary   Patient encouraged to maintain heart healthy diet, regular exercise, adequate sleep. Consider daily probiotics. Take medications as prescribed.        Relevant Orders   CBC with Differential/Platelet   Comprehensive metabolic panel with GFR   TSH    Depression with anxiety   Stable on Lexapro . Continue same. Denies SI/HI.       Insomnia   Stable on alprazolam  prn, uses sparingly.  She declines refill today, would like to stop taking HS.Discussed alternative insomnia management options. Prefers melatonin over alprazolam  for sleep. - Consider trazodone  for insomnia  - Use melatonin for sleep as preferred.   Encouraged good sleep hygiene such as dark, quiet room. No blue/green glowing lights such as computer screens in bedroom. No alcohol or stimulants in evening. Cut down on caffeine as able. Regular exercise is helpful but not just prior to bed time.  Last UDS- 05/2024 RTC if symptoms persist or worsen      Tobacco abuse, in remission   Now only smokes occasionally when she drinks socially. She is encouraged to try quitting drinking altogether for 6 months to see if she can break the association       Vitamin D  deficiency   Supplement and monitor       Relevant Orders   Vitamin D  (25 hydroxy)   Other Visit Diagnoses       Vitamin B12 deficiency       Relevant Orders   Vitamin B12     Need for influenza vaccination       Relevant Orders   Flu vaccine trivalent PF, 6mos and older(Flulaval,Afluria,Fluarix,Fluzone) (Completed)       HCM Immunizations: Influenza, PNA, shingles-due MGM-last 06/2023, due 06/2025-follows with [facility]- [facility]-last 04/2021, due 04/2026- follows with GYN CRC screen- last 04/2021, Repeat 04/2028  General Health Maintenance Routine health maintenance discussed.  FU in 1 year CPE  I am having Tereza B. Foody maintain her omeprazole , acetaminophen , vitamin C, Cholecalciferol (VITAMIN D3 PO), zinc gluconate, Cyanocobalamin  (VITAMIN B-12 PO), estradiol , ALPRAZolam , and escitalopram .  No orders of the defined types were placed in this encounter.  "

## 2025-01-02 NOTE — Assessment & Plan Note (Signed)
 Supplement and monitor

## 2025-01-02 NOTE — Assessment & Plan Note (Signed)
 Stable on alprazolam  prn, uses sparingly.  She declines refill today, would like to stop taking HS.Discussed alternative insomnia management options. Prefers melatonin over alprazolam  for sleep. - Consider trazodone  for insomnia  - Use melatonin for sleep as preferred.   Encouraged good sleep hygiene such as dark, quiet room. No blue/green glowing lights such as computer screens in bedroom. No alcohol or stimulants in evening. Cut down on caffeine as able. Regular exercise is helpful but not just prior to bed time.  Last UDS- 05/2024 RTC if symptoms persist or worsen

## 2025-01-03 ENCOUNTER — Ambulatory Visit: Admitting: Student

## 2025-01-03 ENCOUNTER — Encounter: Payer: Self-pay | Admitting: Student

## 2025-01-03 ENCOUNTER — Telehealth: Payer: Self-pay | Admitting: Family Medicine

## 2025-01-03 ENCOUNTER — Telehealth: Payer: Self-pay

## 2025-01-03 VITALS — BP 122/71 | HR 58 | Ht 67.0 in | Wt 168.8 lb

## 2025-01-03 DIAGNOSIS — F17201 Nicotine dependence, unspecified, in remission: Secondary | ICD-10-CM

## 2025-01-03 DIAGNOSIS — G4700 Insomnia, unspecified: Secondary | ICD-10-CM

## 2025-01-03 DIAGNOSIS — Z0000 Encounter for general adult medical examination without abnormal findings: Secondary | ICD-10-CM | POA: Diagnosis not present

## 2025-01-03 DIAGNOSIS — E538 Deficiency of other specified B group vitamins: Secondary | ICD-10-CM

## 2025-01-03 DIAGNOSIS — Z23 Encounter for immunization: Secondary | ICD-10-CM

## 2025-01-03 DIAGNOSIS — F418 Other specified anxiety disorders: Secondary | ICD-10-CM

## 2025-01-03 DIAGNOSIS — R739 Hyperglycemia, unspecified: Secondary | ICD-10-CM

## 2025-01-03 DIAGNOSIS — E559 Vitamin D deficiency, unspecified: Secondary | ICD-10-CM | POA: Diagnosis not present

## 2025-01-03 LAB — VITAMIN B12: Vitamin B-12: >1500 pg/mL — ABNORMAL HIGH (ref 211–911)

## 2025-01-03 LAB — CBC WITH DIFFERENTIAL/PLATELET
Basophils Absolute: 0.1 K/uL (ref 0.0–0.1)
Basophils Relative: 1 % (ref 0.0–3.0)
Eosinophils Absolute: 0.3 K/uL (ref 0.0–0.7)
Eosinophils Relative: 4 % (ref 0.0–5.0)
HCT: 40.6 % (ref 36.0–46.0)
Hemoglobin: 13.4 g/dL (ref 12.0–15.0)
Lymphocytes Relative: 27.4 % (ref 12.0–46.0)
Lymphs Abs: 1.9 K/uL (ref 0.7–4.0)
MCHC: 33 g/dL (ref 30.0–36.0)
MCV: 100.1 fl — ABNORMAL HIGH (ref 78.0–100.0)
Monocytes Absolute: 0.6 K/uL (ref 0.1–1.0)
Monocytes Relative: 9.1 % (ref 3.0–12.0)
Neutro Abs: 4 K/uL (ref 1.4–7.7)
Neutrophils Relative %: 58.5 % (ref 43.0–77.0)
Platelets: 277 K/uL (ref 150.0–400.0)
RBC: 4.06 Mil/uL (ref 3.87–5.11)
RDW: 13.6 % (ref 11.5–15.5)
WBC: 6.8 K/uL (ref 4.0–10.5)

## 2025-01-03 LAB — COMPREHENSIVE METABOLIC PANEL WITH GFR
ALT: 13 U/L (ref 3–35)
AST: 19 U/L (ref 5–37)
Albumin: 4.7 g/dL (ref 3.5–5.2)
Alkaline Phosphatase: 74 U/L (ref 39–117)
BUN: 13 mg/dL (ref 6–23)
CO2: 28 meq/L (ref 19–32)
Calcium: 9.2 mg/dL (ref 8.4–10.5)
Chloride: 104 meq/L (ref 96–112)
Creatinine, Ser: 0.61 mg/dL (ref 0.40–1.20)
GFR: 101.97 mL/min
Glucose, Bld: 38 mg/dL — CL (ref 70–99)
Potassium: 4.3 meq/L (ref 3.5–5.1)
Sodium: 137 meq/L (ref 135–145)
Total Bilirubin: 0.6 mg/dL (ref 0.2–1.2)
Total Protein: 7.3 g/dL (ref 6.0–8.3)

## 2025-01-03 LAB — HEMOGLOBIN A1C: Hgb A1c MFr Bld: 5.4 % (ref 4.6–6.5)

## 2025-01-03 LAB — VITAMIN D 25 HYDROXY (VIT D DEFICIENCY, FRACTURES): VITD: 32.81 ng/mL (ref 30.00–100.00)

## 2025-01-03 LAB — TSH: TSH: 1.03 u[IU]/mL (ref 0.35–5.50)

## 2025-01-03 NOTE — Telephone Encounter (Signed)
 Pt requesting to switch pcp to [facility].

## 2025-01-03 NOTE — Telephone Encounter (Signed)
 Pt is scheduled

## 2025-01-03 NOTE — Assessment & Plan Note (Addendum)
 Stable on Lexapro . Continue same. Denies SI/HI.

## 2025-01-03 NOTE — Assessment & Plan Note (Signed)
 Patient encouraged to maintain heart healthy diet, regular exercise, adequate sleep. Consider daily probiotics. Take medications as prescribed

## 2025-01-03 NOTE — Telephone Encounter (Signed)
 Vikki, [employer] called with critical lab result glucose 38. Result read back and verified. Called on call provider Dr. Purcell and made him aware.       Copied from CRM [id number]. Topic: Clinical - Lab/Test Results >> Jan 03, 2025  5:09 PM Drema MATSU wrote: Reason for CRM: Vikki with [facility]igan is calling to give critical results.

## 2025-01-03 NOTE — Assessment & Plan Note (Signed)
 hgba1c acceptable, minimize simple carbs. Increase exercise as tolerated.

## 2025-01-03 NOTE — Assessment & Plan Note (Signed)
Now only smokes occasionally when she drinks socially. She is encouraged to try quitting drinking altogether for 6 months to see if she can break the association ?

## 2025-01-04 ENCOUNTER — Ambulatory Visit: Payer: Self-pay | Admitting: Student

## 2025-01-04 ENCOUNTER — Encounter: Payer: Self-pay | Admitting: Student

## 2025-01-04 DIAGNOSIS — E162 Hypoglycemia, unspecified: Secondary | ICD-10-CM

## 2025-01-04 NOTE — Telephone Encounter (Signed)
 Looks like triage made Dr. Purcell (on call physician last night aware)

## 2025-01-08 ENCOUNTER — Encounter: Payer: Self-pay | Admitting: Sports Medicine

## 2025-01-08 ENCOUNTER — Ambulatory Visit: Admitting: Sports Medicine

## 2025-01-08 ENCOUNTER — Ambulatory Visit: Payer: Self-pay

## 2025-01-08 VITALS — BP 137/87 | HR 60 | Temp 98.6°F | Wt 168.0 lb

## 2025-01-08 DIAGNOSIS — R220 Localized swelling, mass and lump, head: Secondary | ICD-10-CM

## 2025-01-08 DIAGNOSIS — R1013 Epigastric pain: Secondary | ICD-10-CM | POA: Diagnosis not present

## 2025-01-08 DIAGNOSIS — E162 Hypoglycemia, unspecified: Secondary | ICD-10-CM

## 2025-01-08 LAB — COMPREHENSIVE METABOLIC PANEL WITH GFR
ALT: 11 U/L (ref 3–35)
AST: 15 U/L (ref 5–37)
Albumin: 4.5 g/dL (ref 3.5–5.2)
Alkaline Phosphatase: 79 U/L (ref 39–117)
BUN: 10 mg/dL (ref 6–23)
CO2: 31 meq/L (ref 19–32)
Calcium: 9.5 mg/dL (ref 8.4–10.5)
Chloride: 99 meq/L (ref 96–112)
Creatinine, Ser: 0.65 mg/dL (ref 0.40–1.20)
GFR: 100.41 mL/min
Glucose, Bld: 98 mg/dL (ref 70–99)
Potassium: 3.8 meq/L (ref 3.5–5.1)
Sodium: 138 meq/L (ref 135–145)
Total Bilirubin: 0.4 mg/dL (ref 0.2–1.2)
Total Protein: 7.2 g/dL (ref 6.0–8.3)

## 2025-01-08 LAB — CBC WITH DIFFERENTIAL/PLATELET
Basophils Absolute: 0.1 K/uL (ref 0.0–0.1)
Basophils Relative: 0.9 % (ref 0.0–3.0)
Eosinophils Absolute: 0.2 K/uL (ref 0.0–0.7)
Eosinophils Relative: 2.9 % (ref 0.0–5.0)
HCT: 38.6 % (ref 36.0–46.0)
Hemoglobin: 13.1 g/dL (ref 12.0–15.0)
Lymphocytes Relative: 25.5 % (ref 12.0–46.0)
Lymphs Abs: 1.5 K/uL (ref 0.7–4.0)
MCHC: 33.9 g/dL (ref 30.0–36.0)
MCV: 98.7 fl (ref 78.0–100.0)
Monocytes Absolute: 0.7 K/uL (ref 0.1–1.0)
Monocytes Relative: 11.7 % (ref 3.0–12.0)
Neutro Abs: 3.6 K/uL (ref 1.4–7.7)
Neutrophils Relative %: 59 % (ref 43.0–77.0)
Platelets: 221 K/uL (ref 150.0–400.0)
RBC: 3.92 Mil/uL (ref 3.87–5.11)
RDW: 13.1 % (ref 11.5–15.5)
WBC: 6 K/uL (ref 4.0–10.5)

## 2025-01-08 LAB — LIPASE: Lipase: 32 U/L (ref 11.0–59.0)

## 2025-01-08 MED ORDER — DOXYCYCLINE HYCLATE 100 MG PO TABS: 100.0000 mg | ORAL_TABLET | Freq: Two times a day (BID) | ORAL | 0 refills | Status: AC

## 2025-01-08 MED ORDER — FLUCONAZOLE 150 MG PO TABS: 150.0000 mg | ORAL_TABLET | Freq: Once | ORAL | 0 refills | Status: AC

## 2025-01-08 NOTE — Telephone Encounter (Signed)
" °  FYI Only or Action Required?: FYI only for provider: appointment scheduled on 1/13.  Patient was last seen in primary care on 01/03/2025 by Wheeler Harlene CROME, NP.  Called Nurse Triage reporting Jaw Pain.  Symptoms began several days ago.  Interventions attempted: Nothing.  Symptoms are: gradually worsening.  Triage Disposition: No disposition on file.  Patient/caregiver understands and will follow disposition?:    Copied from CRM [id number]. Topic: Clinical - Red Word Triage >> Jan 08, 2025 10:17 AM Viola FALCON wrote: Red Word that prompted transfer to Nurse Triage: Patient has swollen lymph nodes, headaches, right ear pain/ulcer, pain in gut area - requesting appt Reason for Disposition  [1] Swollen area of face AND [2] is painful to touch  Answer Assessment - Initial Assessment Questions 1. ONSET: When did the pain start? (e.g., minutes, hours, days)     Few days ago 2. ONSET: Does the pain come and go, or has it been constant since it started? (e.g., constant, intermittent, fleeting)     constant 3. SEVERITY: How bad is the pain? (Scale 1-10; mild, moderate or severe)     5/10 4. LOCATION: Where does it hurt?      Rt mouth/ face/ ear 5. RASH: Is there any redness, rash, or swelling of your face?     denies 6. FEVER: Do you have a fever? If Yes, ask: What is it, how was it measured, and when did it start?      no 7. OTHER SYMPTOMS: Do you have any other symptoms? (e.g., fever, toothache, nasal discharge, nasal congestion, clicking sensation in jaw joint)     Ulcer in mouth between rt jaw and gum, ear pain, mild swelling of face  Protocols used: Face Pain-A-AH  "

## 2025-01-08 NOTE — Telephone Encounter (Signed)
 Patient has an appointment @ [facility]

## 2025-01-08 NOTE — Progress Notes (Unsigned)
 di  Careteam: Patient Care Team: Domenica Harlene LABOR, MD as PCP - General (Family Medicine) Latisha Medford, MD as Consulting Physician (Obstetrics and Gynecology)  Allergies[1]  No chief complaint on file.   Discussed the use of AI scribe software for clinical note transcription with the patient, who gave verbal consent to proceed.  History of Present Illness  Samantha Morales is a 54 year old female who presents with jaw pain and systemic symptoms following a recent flu shot.  She developed an ulcer on her jaw and gum in the very back, which woke her up at 3 AM two nights ago. The pain is constant, exacerbated by touch, and radiates to the right side of her neck and ear. She reports some difficulty swallowing on the right side.  pt c/o abdominal discomfort describes as a sensation of her 'insides feeling like mush', and pain radiating through her body when standing or sitting. These symptoms started around 3 PM the previous day and persisted into the night. The pain is constant, with some improvement today, but still present. Reports 2/10 pain. No nausea, vomiting, or diarrhea, but she reports a slight runny nose and body aches.  She did home covid/ flu test which both came negative.  She has a history of acid reflux for which she takes omeprazole  daily and also takes Lexapro  regularly. She reports a history of low blood glucose noted in recent blood work. She has experienced episodes of dizziness and weakness, particularly when not eating regularly.  She denies any recent dental issues, with her last checkup six months ago and another scheduled soon. She has a history of two significant illnesses over the past two months, one during Thanksgiving and another over Christmas, which were atypical for her. No recent fever, with her temperature slightly elevated from her baseline but not concerning. Home COVID and flu tests were negative.    Review of Systems:  Review of Systems   Constitutional:  Positive for malaise/fatigue. Negative for fever.  HENT:  Positive for ear pain and sore throat. Negative for ear discharge.   Gastrointestinal:  Positive for abdominal pain. Negative for blood in stool, diarrhea, heartburn, nausea and vomiting.  Neurological:  Positive for headaches. Negative for dizziness.   Negative unless indicated in HPI.   Patient Active Problem List   Diagnosis Date Noted   Annual physical exam 01/02/2025   High risk medication use 06/01/2024   Hyperlipidemia 05/17/2023   Right knee pain 05/05/2022   History of COVID-19 09/09/2020   Neck pain 07/13/2020   Paresthesias 07/13/2020   Palpitations 06/28/2020   Postural dizziness with presyncope 06/28/2020   Cardiac murmur 06/28/2020   Onychomycosis 07/03/2018   Chronic left shoulder pain 07/03/2018   Epistaxis 10/26/2016   Hyperglycemia 05/10/2016   Overweight 01/19/2016   Menorrhagia with irregular cycle 12/05/2015   Anemia 03/13/2015   Cervical disc disorder with radiculopathy of cervical region 11/26/2014   Nonallopathic lesion of cervical region 11/26/2014   Nonallopathic lesion of thoracic region 11/26/2014   Nonallopathic lesion-rib cage 11/26/2014   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   Lateral epicondylitis 09/14/2014   Vitamin D  deficiency 09/03/2014   Impetigo 09/03/2014   Preventative health care 02/28/2014   Acute sinusitis 12/12/2012   Insomnia 12/12/2012   Low back pain 07/22/2012   Atypical chest pain 04/15/2012   Elevated BP 04/15/2012   Tobacco abuse, in remission 05/12/2011   Depression with anxiety 01/20/2011   GERD 01/20/2011   RESTLESS LEG SYNDROME, HX OF  01/20/2011   Personal history presenting hazards to health 01/20/2011   Past Medical History:  Diagnosis Date   Acute upper respiratory infection 10/26/2016   Allergy    Anemia 03/13/2015   ANEMIA, HX OF 01/20/2011   Anxiety    Atypical chest pain 04/15/2012   Back pain 07/22/2012   Cellulitis  of ankle 05/12/2011   CHICKENPOX, HX OF 01/20/2011   Elevated BP 04/15/2012   Epistaxis 10/26/2016   GERD 01/20/2011   Hyperglycemia 05/10/2016   Insomnia 12/12/2012   LACERATION OF FINGER 01/27/2011   Overweight 01/19/2016   Preventative health care 02/28/2014   Sacroiliac joint pain 07/22/2012   Sinusitis, acute 12/12/2012   Tobacco abuse 05/12/2011   Tobacco abuse, in remission 05/12/2011   Past Surgical History:  Procedure Laterality Date   COLONOSCOPY     x2 in 20's & 30's - for hemorrhoids   lasik eye surgery X b/l     WISDOM TOOTH EXTRACTION     x4   Social History[2] Family History  Problem Relation Age of Onset   Hypertension Mother    Arrhythmia Mother    Lupus Sister    Hypertension Brother    Epilepsy Brother    Cancer Maternal Grandmother        breast   Alzheimer's disease Maternal Grandmother    Aneurysm Maternal Grandfather        brain   Arthritis Maternal Grandfather        rheumatoid   Nephrolithiasis Paternal Grandmother    Heart attack Paternal Grandfather    Anemia Brother    Neuromuscular disorder Neg Hx    Colon cancer Neg Hx    Esophageal cancer Neg Hx    Rectal cancer Neg Hx    Stomach cancer Neg Hx    Allergies[3]  Medications: Patient's Medications  New Prescriptions   No medications on file  Previous Medications   ACETAMINOPHEN  (TYLENOL ) 325 MG TABLET    Take 650 mg by mouth every 6 (six) hours as needed.   ALPRAZOLAM  (XANAX ) 0.25 MG TABLET    Take 1 tablet (0.25 mg total) by mouth 2 (two) times daily as needed for anxiety or sleep.   ASCORBIC ACID (VITAMIN C) 1000 MG TABLET    Take 1,000 mg by mouth daily.   CHOLECALCIFEROL (VITAMIN D3 PO)    Take by mouth.   CYANOCOBALAMIN  (VITAMIN B-12 PO)    Take by mouth.   ESCITALOPRAM  (LEXAPRO ) 20 MG TABLET    Take 1 tablet (20 mg total) by mouth daily.   ESTRADIOL  (ESTRACE ) 0.1 MG/GM VAGINAL CREAM    Place 1 Applicatorful vaginally 2 (two) times a week.   OMEPRAZOLE  (PRILOSEC) 20 MG CAPSULE     90TAKE 1 CAPSULE BY MOUTH EVERY DAY   ZINC GLUCONATE 50 MG TABLET    Take 50 mg by mouth daily.  Modified Medications   No medications on file  Discontinued Medications   No medications on file    Physical Exam: There were no vitals filed for this visit. There is no height or weight on file to calculate BMI. BP Readings from Last 3 Encounters:  01/03/25 122/71  06/01/24 118/80  12/07/23 132/80   Wt Readings from Last 3 Encounters:  01/03/25 168 lb 12.8 oz (76.6 kg)  06/01/24 162 lb 6.4 oz (73.7 kg)  12/07/23 161 lb 6.4 oz (73.2 kg)    Physical Exam Constitutional:      Appearance: Normal appearance.  HENT:     Head: Normocephalic  and atraumatic.     Right Ear: Tympanic membrane normal.     Left Ear: Tympanic membrane normal.     Mouth/Throat:     Comments: Ulcer on the buccal mucosa Rt jaw swelling Jugular lymph nodes+ Cardiovascular:     Rate and Rhythm: Normal rate and regular rhythm.  Pulmonary:     Effort: Pulmonary effort is normal. No respiratory distress.     Breath sounds: Normal breath sounds. No wheezing.  Abdominal:     General: Bowel sounds are normal. There is no distension.     Tenderness: There is no abdominal tenderness. There is no guarding or rebound.     Comments: Epigastric and gastric tenderness+   Musculoskeletal:        General: No swelling or tenderness.  Skin:    General: Skin is dry.  Neurological:     Mental Status: She is alert. Mental status is at baseline.     Sensory: No sensory deficit.     Motor: No weakness.     Labs reviewed: Basic Metabolic Panel: Recent Labs    06/01/24 1039 01/03/25 1149  NA 139 137  K 4.2 4.3  CL 101 104  CO2 25 28  GLUCOSE 83 38*  BUN 13 13  CREATININE 0.67 0.61  CALCIUM 10.0 9.2  TSH  --  1.03   Liver Function Tests: Recent Labs    06/01/24 1039 01/03/25 1149  AST 18 19  ALT 14 13  ALKPHOS 79 74  BILITOT 0.8 0.6  PROT 7.5 7.3  ALBUMIN 4.8 4.7   No results for input(s): LIPASE,  AMYLASE in the last 8760 hours. No results for input(s): AMMONIA in the last 8760 hours. CBC: Recent Labs    06/01/24 1039 01/03/25 1149  WBC 4.6 6.8  NEUTROABS 2.4 4.0  HGB 13.4 13.4  HCT 39.6 40.6  MCV 98.0 100.1*  PLT 241.0 277.0   Lipid Panel: Recent Labs    06/01/24 1039  CHOL 200  HDL 103.20  LDLCALC 87  TRIG 51.0  CHOLHDL 2   TSH: Recent Labs    01/03/25 1149  TSH 1.03   A1C: Lab Results  Component Value Date   HGBA1C 5.4 01/03/2025    Assessment & Plan Jaw swelling Pt has ulcer on buccal cavity  Rt side jaw swelling+ Will send doxycycline  to her pharmacy Pt has appt with dentist tomorrow  Orders:   CBC with Differential/Platelet   doxycycline  (VIBRA -TABS) 100 MG tablet; Take 1 tablet (100 mg total) by mouth 2 (two) times daily.  Epigastric pain Mild epigastric tenderness Afebrile Vitals stable Orders:   CBC with Differential/Platelet   Comp Met (CMET)   Lipase  Hypoglycemia Pt reports intermittent episodes of dizziness,  Recent blood work showed hypiglycemia Will order insulin , c peptide Will check CT abd Orders:   Insulin  and C-Peptide   CT ABDOMEN PELVIS W CONTRAST; Future   No follow-ups on file.:   Samantha Morales     [1]  Allergies Allergen Reactions   Meloxicam  Other (See Comments)    GI upset    Penicillins     REACTION: swelling, rash   Sulfonamide Derivatives     REACTION: GI upset   Zolpidem  Other (See Comments)    Was talking/rambling and did not know she was doing it   [2]  Social History Tobacco Use   Smoking status: Some Days    Current packs/day: 0.00    Average packs/day: 0.5 packs/day    Types: Cigarettes  Last attempt to quit: 04/12/2012    Years since quitting: 12.7   Smokeless tobacco: Never   Tobacco comments:    smokes when social  Vaping Use   Vaping status: Never Used  Substance Use Topics   Alcohol use: Yes    Alcohol/week: 0.0 standard drinks of alcohol    Comment:  socially   Drug use: No  [3]  Allergies Allergen Reactions   Meloxicam  Other (See Comments)    GI upset    Penicillins     REACTION: swelling, rash   Sulfonamide Derivatives     REACTION: GI upset   Zolpidem  Other (See Comments)    Was talking/rambling and did not know she was doing it

## 2025-01-09 ENCOUNTER — Ambulatory Visit: Payer: Self-pay | Admitting: Sports Medicine

## 2025-01-09 LAB — INSULIN AND C-PEPTIDE, SERUM
C-Peptide: 1.6 ng/mL (ref 1.1–4.4)
INSULIN: 4 u[IU]/mL (ref 2.6–24.9)

## 2025-01-09 NOTE — Telephone Encounter (Signed)
 No further action needed at this time.

## 2025-01-10 NOTE — Addendum Note (Signed)
 Addended by: Mory Herrman on: 01/10/2025 10:10 AM   Modules accepted: Orders

## 2025-01-16 ENCOUNTER — Ambulatory Visit

## 2025-01-16 DIAGNOSIS — R109 Unspecified abdominal pain: Secondary | ICD-10-CM

## 2025-01-16 DIAGNOSIS — E162 Hypoglycemia, unspecified: Secondary | ICD-10-CM

## 2025-01-16 MED ORDER — IOHEXOL 300 MG/ML  SOLN
100.0000 mL | Freq: Once | INTRAMUSCULAR | Status: AC | PRN
  Administered 2025-01-16: 100 mL via INTRAVENOUS

## 2025-04-02 ENCOUNTER — Ambulatory Visit: Admitting: Medical

## 2025-04-24 ENCOUNTER — Encounter: Admitting: Medical
